# Patient Record
Sex: Male | Born: 1955
Health system: Southern US, Community
[De-identification: ages and names within clinical notes are randomized; demographics above are authoritative.]

## PROBLEM LIST (undated history)

## (undated) DIAGNOSIS — E119 Type 2 diabetes mellitus without complications: Secondary | ICD-10-CM

## (undated) DIAGNOSIS — C801 Malignant (primary) neoplasm, unspecified: Secondary | ICD-10-CM

## (undated) DIAGNOSIS — T7840XA Allergy, unspecified, initial encounter: Secondary | ICD-10-CM

## (undated) DIAGNOSIS — J129 Viral pneumonia, unspecified: Secondary | ICD-10-CM

## (undated) DIAGNOSIS — I10 Essential (primary) hypertension: Secondary | ICD-10-CM

## (undated) DIAGNOSIS — Z9889 Other specified postprocedural states: Secondary | ICD-10-CM

## (undated) DIAGNOSIS — G4730 Sleep apnea, unspecified: Secondary | ICD-10-CM

## (undated) HISTORY — DX: Essential (primary) hypertension: I10

## (undated) HISTORY — DX: Allergy, unspecified, initial encounter: T78.40XA

## (undated) HISTORY — DX: Type 2 diabetes mellitus without complications: E11.9

## (undated) HISTORY — DX: Other specified postprocedural states: Z98.890

## (undated) HISTORY — DX: Sleep apnea, unspecified: G47.30

## (undated) HISTORY — PX: WISDOM TOOTH EXTRACTION: SHX21

## (undated) HISTORY — DX: Malignant (primary) neoplasm, unspecified: C80.1

## (undated) HISTORY — DX: Viral pneumonia, unspecified: J12.9

## (undated) HISTORY — PX: COLONOSCOPY: SHX174

## (undated) HISTORY — PX: POLYPECTOMY: SHX149

---
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
## 1987-06-22 HISTORY — PX: OTHER SURGICAL HISTORY: SHX169

## 1996-06-21 DIAGNOSIS — Z9889 Other specified postprocedural states: Secondary | ICD-10-CM

## 1996-06-21 HISTORY — DX: Other specified postprocedural states: Z98.890

## 1996-06-21 HISTORY — PX: LYMPH NODE DISSECTION: SHX5087

## 1999-04-28 ENCOUNTER — Encounter: Payer: Self-pay | Admitting: Internal Medicine

## 2013-09-03 LAB — BASIC METABOLIC PANEL: Creatinine: 1 mg/dL (ref <=1.3)

## 2013-11-20 LAB — HEPATIC FUNCTION PANEL
ALT: 18 U/L (ref 10–40)
AST: 18 U/L (ref 14–40)
Bilirubin, Total: 1.2 mg/dL

## 2014-02-21 LAB — HM COLONOSCOPY

## 2014-06-04 LAB — LIPID PANEL: CHOLESTEROL: 122 mg/dL (ref 0–200)

## 2014-06-05 LAB — BASIC METABOLIC PANEL
BUN: 12 mg/dL (ref 4–21)
Glucose: 117 mg/dL
Potassium: 4 mmol/L (ref 3.4–5.3)
Sodium: 143 mmol/L (ref 137–147)

## 2014-06-05 LAB — LIPID PANEL
LDL CALC: 51 mg/dL
LDL/HDL RATIO: 51
Triglycerides: 101 mg/dL (ref 40–160)

## 2014-06-05 LAB — HEMOGLOBIN A1C: Hgb A1c MFr Bld: 6.7 % — AB (ref 4.0–6.0)

## 2014-09-02 ENCOUNTER — Ambulatory Visit (INDEPENDENT_AMBULATORY_CARE_PROVIDER_SITE_OTHER): Payer: 59 | Admitting: Internal Medicine

## 2014-09-02 ENCOUNTER — Encounter: Payer: Self-pay | Admitting: *Deleted

## 2014-09-02 ENCOUNTER — Encounter: Payer: Self-pay | Admitting: Internal Medicine

## 2014-09-02 VITALS — BP 142/90 | HR 96 | Temp 98.1°F | Resp 20 | Ht 66.93 in | Wt 239.0 lb

## 2014-09-02 DIAGNOSIS — I10 Essential (primary) hypertension: Secondary | ICD-10-CM | POA: Insufficient documentation

## 2014-09-02 DIAGNOSIS — D179 Benign lipomatous neoplasm, unspecified: Secondary | ICD-10-CM | POA: Insufficient documentation

## 2014-09-02 DIAGNOSIS — E119 Type 2 diabetes mellitus without complications: Secondary | ICD-10-CM | POA: Diagnosis not present

## 2014-09-02 DIAGNOSIS — J309 Allergic rhinitis, unspecified: Secondary | ICD-10-CM | POA: Diagnosis not present

## 2014-09-02 DIAGNOSIS — Z23 Encounter for immunization: Secondary | ICD-10-CM | POA: Diagnosis not present

## 2014-09-02 DIAGNOSIS — E785 Hyperlipidemia, unspecified: Secondary | ICD-10-CM | POA: Insufficient documentation

## 2014-09-02 DIAGNOSIS — K648 Other hemorrhoids: Secondary | ICD-10-CM

## 2014-09-02 MED ORDER — BENAZEPRIL-HYDROCHLOROTHIAZIDE 10-12.5 MG PO TABS: 1.0000 | ORAL_TABLET | Freq: Every day | ORAL | Status: DC

## 2014-09-02 MED ORDER — TRADJENTA 5 MG PO TABS: 5.0000 mg | ORAL_TABLET | Freq: Every day | ORAL | Status: DC

## 2014-09-02 MED ORDER — METFORMIN HCL 1000 MG PO TABS: 1000.0000 mg | ORAL_TABLET | Freq: Two times a day (BID) | ORAL | Status: DC

## 2014-09-02 MED ORDER — ATORVASTATIN CALCIUM 40 MG PO TABS: 40.0000 mg | ORAL_TABLET | Freq: Every day | ORAL | Status: DC

## 2014-09-02 NOTE — Progress Notes (Signed)
Patient ID: [name], male   DOB: 19-Nov-1955, 59 y.o.   MRN: [id number]   Location:  [facility]  Code Status: full code; will complete advance directive Advanced Directive information Does patient have an advance directive?: No, Would patient like information on creating an advanced directive?: Yes - Educational materials given  No Known Allergies  Chief Complaint  Patient presents with  . Establish Care    HPI: Patient is a 59 y.o. male seen in the office today to establish with the practice.  Has a friend who is a patient.  Had a PCP for 11 years at [facility].  Transitioned to next physician--no changes made.  Getting a lot of labs and is concerned he's on too many meds and wondering how often he truly needs labs.    Last physical was in June 2015.    Beneath stomach but above private area, has lump--forgets what it was called--was not too concerning.  Had surgeon look at it.  Chose not to manage.  Fatty tissue.  It has enlarged some.  When to worry?  DMII:  Diagnosed in 10/2001.  More and more meds added.  No meds, then metformin, then [place] added--now tradjenta due to formulary.  Also on pioglitazone, then strength increased.  Doesn't really know why changes were made.    Has had htn since 1999.  Taking bp meds since 1999.  Began with quinapril.  Long time on his current meds.    Had rectal bleeding last April.  Had cscope in Sept 2015--internal hemorrhoids identified.  Two benign polyps removed.  Occasional blood with wiping, but not overt bleeding.  Had cream for the hemorrhoids, but then told not to use it regularly so stopped.  Hasn't been having itching, pain. Need repeat cscope 2020.  Had lump in left lower neck 1998.  Had seen ENT in [place]--was removed and biopsied as benign.  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  Visits around to make sure providers are  providing state standards.   May drink one drink per week with dinner.  Has a daughter in grad school for speech pathology--now called communication and disorders.  Walks several times per week.  Tries to get out and walk around neighborhood.  Avoids sweets.  Does like salt, but tries to watch it.  Review of Systems:  Review of Systems  Constitutional: Negative for weight loss and malaise/fatigue.  HENT: Positive for congestion.        Sinus problems--uses fluticasone spray, zyrtec, mucinex; occasional infection needing abx about 1x per year  Eyes:       Corrective lenses--contacts, has glasses also;  Had diabetic eye check, last in October--[facility], [place]  Respiratory: Negative for shortness of breath.   Cardiovascular: Positive for leg swelling. Negative for chest pain and palpitations.       Hypertension; occasional swelling around ankles--will be end of day sometimes  Gastrointestinal: Negative for blood in stool and melena.       Diverticulosis, hemorrhoids; blood on toilet paper  Genitourinary: Negative for dysuria, urgency and frequency.       No difficulty starting stream  Musculoskeletal: Positive for back pain. Negative for falls.       Occasionally end of day--does not need medication for most part--rare advil  Neurological: Negative for dizziness.  Endo/Heme/Allergies:       Diabetes; gets his foot exams, but no problems with numbness, tingling  Psychiatric/Behavioral: Negative for depression and memory loss. The patient is not nervous/anxious and does not have insomnia.      Past Medical History  Diagnosis Date  . Hypertension   . Diabetes mellitus without complication     Type 2  . H/O lymph node excision [place] , [place]    No past surgical history on file.  Social History:   reports that he has never smoked. He does not have any smokeless tobacco history on file. He reports that he drinks alcohol. He reports that he does not use  illicit drugs.  Family History  Problem Relation Age of Onset  . Hypertension Mother   . Diabetes Mother 58    Diabetes type 2  . Heart disease Father 14    Heart Attack    Medications: Patient's Medications  New Prescriptions   No medications on file  Previous Medications   ASPIRIN 81 MG TABLET    Take 81 mg by mouth daily.   ATORVASTATIN (LIPITOR) 40 MG TABLET    Take 40 mg by mouth daily.   BENAZEPRIL-HYDROCHLORTHIAZIDE (LOTENSIN HCT) 10-12.5 MG PER TABLET    Take 1 tablet by mouth daily.   CETIRIZINE (ZYRTEC) 10 MG TABLET    Take 10 mg by mouth daily.   DEXTROMETHORPHAN-GUAIFENESIN (MUCINEX DM) 30-600 MG TB12    Take 1 tablet by mouth daily.   FLUTICASONE (FLONASE) 50 MCG/ACT NASAL SPRAY    Place 2 sprays into both nostrils daily.   METFORMIN (GLUCOPHAGE) 1000 MG TABLET    Take 1,000 mg by mouth 2 (two) times daily.   PIOGLITAZONE (ACTOS) 45 MG TABLET    Take 45 mg by mouth daily.   PROCTOSOL HC 2.5 % RECTAL CREAM    as needed.   PSYLLIUM (REGULOID) 0.52 G CAPSULE    Take 0.52 g by mouth daily.   TRADJENTA 5 MG TABS TABLET    Take 5 mg by mouth daily.  Modified Medications   No medications on file  Discontinued Medications   No medications on file     Physical Exam: Filed Vitals:   09/02/14 0905  BP: 142/90  Pulse: 96  Temp: 98.1 F (36.7 C)  TempSrc: Oral  Resp: 20  Height: 5' 6.93" (1.7 m)  Weight: 239 lb (108.41 kg)  SpO2: 95%  Physical Exam  Constitutional: He is oriented to person, place, and time. He appears well-developed and well-nourished. No distress.  Overweight white male, nad  Cardiovascular: Normal rate, regular rhythm, normal heart sounds and intact distal pulses.   ? Click vs. Extra heart sound  Pulmonary/Chest: Effort normal and breath sounds normal. No respiratory distress.  Musculoskeletal: Normal range of motion.  See diabetic foot exam  Neurological: He is alert and oriented to person, place, and time.  Skin: Skin is warm and dry.    Approximately 1.5 in wide soft mass, 1 in high in suprapubic area, nontender, mobile (lipoma)    Labs reviewed: Reviewed records from [facility]--to be abstracted by RMA  Past Procedures: Reviewed cscope report 2015 with benign polyps, internal hemorrhoids, diverticulosis--repeat needed 2020  Assessment/Plan 1. Diabetes mellitus type II, controlled - will assess current hba1c and bmp -cont metformin and tradjenta--I would prefer not to cont actos considering his edema and his fh/o cad in his dad -will determine alternative after labs return -discussed walking 30 mins routinely per day and adhering to the diet with proper portion sizes and 2000 calories per day max - metFORMIN (GLUCOPHAGE) 1000  MG tablet; Take 1 tablet (1,000 mg total) by mouth 2 (two) times daily.  Dispense: 180 tablet; Refill: 1 - TRADJENTA 5 MG TABS tablet; Take 1 tablet (5 mg total) by mouth daily.  Dispense: 90 tablet; Refill: 1 - Hemoglobin R6V - Basic metabolic panel  2. Hyperlipidemia -lipids were at goal in Dec 2015 and have been at goal for years he says with lipitor - atorvastatin (LIPITOR) 40 MG tablet; Take 1 tablet (40 mg total) by mouth daily.  Dispense: 90 tablet; Refill: 1  3. Essential hypertension, benign -bp was above goal today with his diabetes, but also first visit here and seemed anxious so will reassess next time - benazepril-hydrochlorthiazide (LOTENSIN HCT) 10-12.5 MG per tablet; Take 1 tablet by mouth daily.  Dispense: 90 tablet; Refill: 1  4. Lipoma -of lower abdomen, suprapubic area -previously identified and no surgery recommended -may have grown slightly  5. Allergic sinusitis -cont flonase, zyrtec, and mucinex as he has flare ups if he does not take these  6. Internal hemorrhoids -with h/o some hemorrhoidal bleeding; benign polyps on cscope--needs f/u 2020  7. Need for vaccination with 13-polyvalent pneumococcal conjugate vaccine -prevnar given today  Labs/tests ordered:    Orders Placed This Encounter  Procedures  . Hemoglobin A1c  . Basic metabolic panel    Next appt:  3 mos to discuss possible OSA, f/u on diabetes  Herlinda Heady L. Akshaya Toepfer, D.O. [facility] [address], [place] [address] Cell Phone (Mon-Fri 8am-5pm):  [phone] On Call:  [phone] & follow prompts after 5pm & weekends Office Phone:  [phone] Office Fax:  [phone]

## 2014-09-03 LAB — BASIC METABOLIC PANEL
BUN/Creatinine Ratio: 12 (ref 9–20)
BUN: 11 mg/dL (ref 6–24)
CO2: 26 mmol/L (ref 18–29)
Calcium: 8.7 mg/dL (ref 8.7–10.2)
Chloride: 102 mmol/L (ref 97–108)
Creatinine, Ser: 0.91 mg/dL (ref 0.76–1.27)
GFR calc Af Amer: 107 mL/min/{1.73_m2} (ref >59)
GFR calc non Af Amer: 93 mL/min/{1.73_m2} (ref >59)
Glucose: 123 mg/dL — ABNORMAL HIGH (ref 65–99)
Potassium: 3.8 mmol/L (ref 3.5–5.2)
Sodium: 142 mmol/L (ref 134–144)

## 2014-09-03 LAB — HEMOGLOBIN A1C
Est. average glucose Bld gHb Est-mCnc: 148 mg/dL
Hgb A1c MFr Bld: 6.8 % — ABNORMAL HIGH (ref 4.8–5.6)

## 2014-09-24 ENCOUNTER — Other Ambulatory Visit: Payer: Self-pay | Admitting: *Deleted

## 2014-09-24 MED ORDER — PIOGLITAZONE HCL 45 MG PO TABS: ORAL_TABLET | ORAL | Status: DC

## 2014-09-24 NOTE — Telephone Encounter (Signed)
[facility] 

## 2014-12-06 ENCOUNTER — Ambulatory Visit (INDEPENDENT_AMBULATORY_CARE_PROVIDER_SITE_OTHER): Payer: 59 | Admitting: Internal Medicine

## 2014-12-06 ENCOUNTER — Encounter: Payer: Self-pay | Admitting: Internal Medicine

## 2014-12-06 VITALS — BP 130/78 | HR 94 | Temp 98.1°F | Resp 20 | Ht 67.0 in | Wt 239.6 lb

## 2014-12-06 DIAGNOSIS — I10 Essential (primary) hypertension: Secondary | ICD-10-CM | POA: Diagnosis not present

## 2014-12-06 DIAGNOSIS — T884XXA Failed or difficult intubation, initial encounter: Secondary | ICD-10-CM | POA: Diagnosis not present

## 2014-12-06 DIAGNOSIS — E785 Hyperlipidemia, unspecified: Secondary | ICD-10-CM | POA: Diagnosis not present

## 2014-12-06 DIAGNOSIS — L570 Actinic keratosis: Secondary | ICD-10-CM

## 2014-12-06 DIAGNOSIS — X32XXXA Exposure to sunlight, initial encounter: Secondary | ICD-10-CM

## 2014-12-06 DIAGNOSIS — E119 Type 2 diabetes mellitus without complications: Secondary | ICD-10-CM

## 2014-12-06 DIAGNOSIS — R609 Edema, unspecified: Secondary | ICD-10-CM

## 2014-12-06 NOTE — Progress Notes (Signed)
Patient ID: [name], male   DOB: 13-Feb-1956, 59 y.o.   MRN: [id number]   Location:  [facility]  Goals of Care: Advanced Directive information Does patient have an advance directive?: No, Would patient like information on creating an advanced directive?: Yes - Educational materials given (last visit, need to discuss again and check if he's done this so he can bring a copy)   Chief Complaint  Patient presents with  . Medical Management of Chronic Issues    HPI: Patient is a 59 y.o. white male seen in the office today for 3 mo f/u for med mgt of chronic diseases.    Doing ok.   Last hba1c was 6.8.  Still struggling with time for exercise.  His wife keeps reminding him about it.  Is doing well with his dietary choices.  July 1st his insurance flips.  Wonders about waiting to see what's covered.  Will know Monday if insurance company will be the same.  Says he will send me a mychart message.    Asks about pneumovax--keep getting mychart reminders.    Has a place on his right arm.  Has been there for several months.  Comes to a head, then disappears but now staying there with a scab.  He is very fair-skinned.  He's had bad sunburn in the [employer].    When he had his cscope in 9/15, the anesthesiologist was concerned.  His airway was difficult to intubate.  He does snore also.  Does not fall asleep during the day when working or reading or watching tv.      Review of Systems:  Review of Systems  Constitutional: Negative for fever, chills, weight loss and malaise/fatigue.       Obese  HENT: Negative for congestion and hearing loss.   Respiratory: Negative for shortness of breath.        Snores  Cardiovascular: Negative for chest pain and leg swelling.  Gastrointestinal: Negative for abdominal pain, constipation, blood in stool and melena.  Genitourinary: Negative for dysuria, urgency and frequency.  Musculoskeletal: Negative for falls.    Skin:       Place on right forearm concerning him  Neurological: Negative for dizziness, weakness and headaches.  Endo/Heme/Allergies:       Diabetes  Psychiatric/Behavioral: Negative for depression and memory loss.    Past Medical History  Diagnosis Date  . Hypertension   . Diabetes mellitus without complication     Type 2  . H/O lymph node excision [place] , [place]  . Viral pneumonia 1965    No past surgical history on file.  No Known Allergies Medications: Patient's Medications  New Prescriptions   No medications on file  Previous Medications   ASPIRIN 81 MG TABLET    Take 81 mg by mouth daily.   ATORVASTATIN (LIPITOR) 40 MG TABLET    Take 1 tablet (40 mg total) by mouth daily.   BENAZEPRIL-HYDROCHLORTHIAZIDE (LOTENSIN HCT) 10-12.5 MG PER TABLET    Take 1 tablet by mouth daily.   CETIRIZINE (ZYRTEC) 10 MG TABLET    Take 10 mg by mouth daily.   DEXTROMETHORPHAN-GUAIFENESIN (MUCINEX DM) 30-600 MG TB12    Take 1 tablet by mouth daily.   FLUTICASONE (FLONASE) 50 MCG/ACT NASAL SPRAY    Place 2 sprays into both nostrils daily.   METFORMIN (GLUCOPHAGE) 1000 MG TABLET    Take 1 tablet (1,000 mg total) by mouth 2 (two) times daily.  PIOGLITAZONE (ACTOS) 45 MG TABLET    Take one tablet by mouth once daily to control blood sugar   PROCTOSOL HC 2.5 % RECTAL CREAM    as needed.   PSYLLIUM (REGULOID) 0.52 G CAPSULE    Take 0.52 g by mouth daily.   TRADJENTA 5 MG TABS TABLET    Take 1 tablet (5 mg total) by mouth daily.  Modified Medications   No medications on file  Discontinued Medications   No medications on file    Physical Exam: Filed Vitals:   12/06/14 0809  BP: 130/78  Pulse: 94  Temp: 98.1 F (36.7 C)  TempSrc: Oral  Resp: 20  Height: 5\' 7"  (1.702 m)  Weight: 239 lb 9.6 oz (108.682 kg)  SpO2: 92%   Physical Exam  Constitutional: He is oriented to person, place, and time. He appears well-developed and well-nourished. No distress.  Cardiovascular: Normal  rate, regular rhythm and normal heart sounds.   Mild edema at ankles at sockline  Pulmonary/Chest: Effort normal and breath sounds normal.  Abdominal: Soft. Bowel sounds are normal.  Musculoskeletal: Normal range of motion.  Neurological: He is alert and oriented to person, place, and time.  Delayed word finding  Skin: Skin is warm.  Right forearm with raised, papule with eschar on top  Psychiatric: He has a normal mood and affect.    Labs reviewed: Basic Metabolic Panel:  Recent Labs  06/05/14 09/02/14 1036  NA 143 142  K 4.0 3.8  CL  --  102  CO2  --  26  GLUCOSE  --  123*  BUN 12 11  CREATININE  --  0.91  CALCIUM  --  8.7   Liver Function Tests: No results for input(s): AST, ALT, ALKPHOS, BILITOT, PROT, ALBUMIN in the last 8760 hours. No results for input(s): LIPASE, AMYLASE in the last 8760 hours. No results for input(s): AMMONIA in the last 8760 hours. CBC: No results for input(s): WBC, NEUTROABS, HGB, HCT, MCV, PLT in the last 8760 hours. Lipid Panel:  Recent Labs  06/04/14 06/05/14  CHOL 122  --   LDLCALC  --  51  TRIG  --  101   Lab Results  Component Value Date   HGBA1C 6.8* 09/02/2014   Assessment/Plan 1. Diabetes mellitus type II, controlled - cont dietary changes, actos, metformin and tradjenta--await change in insurance to see what is covered--he will bring formulary in next time  -encouraged regular exercise like walking  For weight loss - Hemoglobin H2C - Basic metabolic panel  2. Hyperlipidemia Cont zocor, dietary changes and increase exercise program - Lipid panel  3. Essential hypertension, benign -cont bp meds  - Basic metabolic panel  4. Actinic keratosis due to exposure to sunlight - due to persistence, this is concerning;  Has been sunburned here years ago and is very fair-skinned so question if this is a squamous cell ca - Ambulatory referral to Dermatology  5. Difficult airway for intubation, initial encounter -per  anesthesiology when pt had cscope, also snores -denies daytime sleepiness, but is obese with large neck, hypertensive, diabetic -will plan to do sleep study once he knows what his insurance coverage will be in July  6. Edema -suspect this is due to his actos use--would consider trulicity or other weekly injection for his diabetes in its place if it will be covered so we can also help with weight loss (may also have him see Tivis Ringer) - Basic metabolic panel  Labs/tests ordered: Orders Placed This Encounter  Procedures  . Hemoglobin A1c  . Basic metabolic panel    Order Specific Question:  Has the patient fasted?    Answer:  Yes  . Lipid panel    Order Specific Question:  Has the patient fasted?    Answer:  Yes  . Ambulatory referral to Dermatology    Referral Priority:  Routine    Referral Type:  Consultation    Referral Reason:  Specialty Services Required    Requested Specialty:  Dermatology    Number of Visits Requested:  1    Next appt:  3 mos   Hollister Wessler L. Mancil Pfenning, D.O. [facility] [address], [place] [address] Cell Phone (Mon-Fri 8am-5pm):  [phone] On Call:  [phone] & follow prompts after 5pm & weekends Office Phone:  [phone] Office Fax:  [phone]

## 2014-12-07 LAB — BASIC METABOLIC PANEL
BUN/Creatinine Ratio: 11 (ref 9–20)
BUN: 11 mg/dL (ref 6–24)
CO2: 21 mmol/L (ref 18–29)
Calcium: 8.9 mg/dL (ref 8.7–10.2)
Chloride: 104 mmol/L (ref 97–108)
Creatinine, Ser: 0.99 mg/dL (ref 0.76–1.27)
GFR calc Af Amer: 97 mL/min/{1.73_m2} (ref >59)
GFR calc non Af Amer: 84 mL/min/{1.73_m2} (ref >59)
Glucose: 112 mg/dL — ABNORMAL HIGH (ref 65–99)
Potassium: 4.3 mmol/L (ref 3.5–5.2)
Sodium: 140 mmol/L (ref 134–144)

## 2014-12-07 LAB — LIPID PANEL
Chol/HDL Ratio: 2.2 ratio units (ref 0.0–5.0)
Cholesterol, Total: 99 mg/dL — ABNORMAL LOW (ref 100–199)
HDL: 45 mg/dL (ref >39)
LDL Calculated: 39 mg/dL (ref 0–99)
Triglycerides: 76 mg/dL (ref 0–149)
VLDL Cholesterol Cal: 15 mg/dL (ref 5–40)

## 2014-12-07 LAB — HEMOGLOBIN A1C
Est. average glucose Bld gHb Est-mCnc: 143 mg/dL
Hgb A1c MFr Bld: 6.6 % — ABNORMAL HIGH (ref 4.8–5.6)

## 2014-12-31 DIAGNOSIS — C4492 Squamous cell carcinoma of skin, unspecified: Secondary | ICD-10-CM

## 2014-12-31 HISTORY — DX: Squamous cell carcinoma of skin, unspecified: C44.92

## 2015-01-02 ENCOUNTER — Encounter: Payer: Self-pay | Admitting: Internal Medicine

## 2015-01-03 NOTE — Telephone Encounter (Signed)
Please advise 

## 2015-02-27 ENCOUNTER — Ambulatory Visit (INDEPENDENT_AMBULATORY_CARE_PROVIDER_SITE_OTHER): Payer: PRIVATE HEALTH INSURANCE | Admitting: Internal Medicine

## 2015-02-27 ENCOUNTER — Encounter: Payer: Self-pay | Admitting: Internal Medicine

## 2015-02-27 ENCOUNTER — Ambulatory Visit
Admission: RE | Admit: 2015-02-27 | Discharge: 2015-02-27 | Disposition: A | Discharge status: Home / Self Care | Payer: PRIVATE HEALTH INSURANCE | Source: Ambulatory Visit | Attending: Internal Medicine | Admitting: Internal Medicine

## 2015-02-27 VITALS — BP 130/90 | HR 91 | Temp 98.2°F | Resp 19 | Ht 67.0 in | Wt 243.2 lb

## 2015-02-27 DIAGNOSIS — E669 Obesity, unspecified: Secondary | ICD-10-CM

## 2015-02-27 DIAGNOSIS — E785 Hyperlipidemia, unspecified: Secondary | ICD-10-CM

## 2015-02-27 DIAGNOSIS — E119 Type 2 diabetes mellitus without complications: Secondary | ICD-10-CM

## 2015-02-27 DIAGNOSIS — Z23 Encounter for immunization: Secondary | ICD-10-CM | POA: Diagnosis not present

## 2015-02-27 DIAGNOSIS — M25461 Effusion, right knee: Secondary | ICD-10-CM | POA: Diagnosis not present

## 2015-02-27 DIAGNOSIS — R609 Edema, unspecified: Secondary | ICD-10-CM | POA: Diagnosis not present

## 2015-02-27 DIAGNOSIS — M25561 Pain in right knee: Secondary | ICD-10-CM | POA: Diagnosis not present

## 2015-02-27 DIAGNOSIS — J309 Allergic rhinitis, unspecified: Secondary | ICD-10-CM | POA: Diagnosis not present

## 2015-02-27 MED ORDER — ATORVASTATIN CALCIUM 40 MG PO TABS: 40.0000 mg | ORAL_TABLET | Freq: Every day | ORAL | Status: DC

## 2015-02-27 MED ORDER — METFORMIN HCL 1000 MG PO TABS: 1000.0000 mg | ORAL_TABLET | Freq: Two times a day (BID) | ORAL | Status: DC

## 2015-02-27 MED ORDER — FLUTICASONE PROPIONATE 50 MCG/ACT NA SUSP: 2.0000 | Freq: Every day | NASAL | Status: DC

## 2015-02-27 NOTE — Progress Notes (Signed)
Patient ID: [name], male   DOB: 05/13/1956, 59 y.o.   MRN: [id number]   Location:  [facility]  Goals of Care: Advanced Directive information Does patient have an advance directive?: Yes   Chief Complaint  Patient presents with  . Acute Visit    Swollen feet noticed about a week ago has pain in right knee   . Immunizations    would like flu shot today    HPI: Patient is a 59 y.o. white male seen in the office today for an acute visit due to swelling of his feet and pain in his right knee.    Has had about a week of swelling in his feet.  Had already had a little swelling in the ankles, but worse now.  Right is worse than left.  No dyspnea.    Also getting pain when he bends his right knee and gets stiff behind the knee and pain in the medial aspect of his knee.  Hurts more when he walks on it.  Feels like he is limping and not bending the knee properly when walking.  Below knee medially--occurs sometimes when presses firmly down--especially when going upstairs--has been putting his left leg first.  Thinks perhaps he twisted it when he picked up something heavy--didn't turn whole body with the leg.  That happened about 2 wks ago.  Stiffens after sitting behind the knee.  Has been trying to get up at work and walk around the hall.  Has taken tylenol and occasionally advil b/c it seems to help more with the tightness in the back of the knee.  Relieves it somewhat.  Did more walking than usual on Sunday and really hurt.  Each day has gradually improved, but not going away.    Review of Systems:  Review of Systems  Constitutional: Negative for fever and chills.  HENT: Positive for congestion.   Eyes: Negative for blurred vision.  Respiratory: Negative for shortness of breath.   Cardiovascular: Positive for leg swelling. Negative for chest pain.  Genitourinary: Negative for dysuria.  Musculoskeletal: Positive for myalgias and joint pain. Negative  for falls.  Skin:       Scars from cryopen and scraping on right forearm    Past Medical History  Diagnosis Date  . Hypertension   . Diabetes mellitus without complication     Type 2  . H/O lymph node excision [place] , [place]  . Viral pneumonia 1965    History reviewed. No pertinent past surgical history.  No Known Allergies Medications: Patient's Medications  New Prescriptions   No medications on file  Previous Medications   ASPIRIN 81 MG TABLET    Take 81 mg by mouth daily.   ATORVASTATIN (LIPITOR) 40 MG TABLET    Take 1 tablet (40 mg total) by mouth daily.   BENAZEPRIL-HYDROCHLORTHIAZIDE (LOTENSIN HCT) 10-12.5 MG PER TABLET    Take 1 tablet by mouth daily.   CETIRIZINE (ZYRTEC) 10 MG TABLET    Take 10 mg by mouth daily.   DEXTROMETHORPHAN-GUAIFENESIN (MUCINEX DM) 30-600 MG TB12    Take 1 tablet by mouth daily.   FLUTICASONE (FLONASE) 50 MCG/ACT NASAL SPRAY    Place 2 sprays into both nostrils daily.   METFORMIN (GLUCOPHAGE) 1000 MG TABLET    Take 1 tablet (1,000 mg total) by mouth 2 (two) times daily.   PIOGLITAZONE (ACTOS) 45 MG TABLET    Take one tablet by mouth once  daily to control blood sugar   PROCTOSOL HC 2.5 % RECTAL CREAM    as needed.   PSYLLIUM (REGULOID) 0.52 G CAPSULE    Take 0.52 g by mouth daily.   TRADJENTA 5 MG TABS TABLET    Take 1 tablet (5 mg total) by mouth daily.  Modified Medications   No medications on file  Discontinued Medications   No medications on file    Physical Exam: Filed Vitals:   02/27/15 0921  BP: 130/90  Pulse: 91  Temp: 98.2 F (36.8 C)  TempSrc: Oral  Resp: 19  Height: 5\' 7"  (1.702 m)  Weight: 243 lb 3.2 oz (110.315 kg)  SpO2: 95%   Physical Exam  Constitutional: He is oriented to person, place, and time. He appears well-developed and well-nourished. No distress.  Obese white male  Cardiovascular: Normal rate, regular rhythm, normal heart sounds and intact distal pulses.   Mild nonpitting edema of left ankle, much  more in right ankle and lower leg  Pulmonary/Chest: Effort normal and breath sounds normal.  Musculoskeletal: He exhibits edema and tenderness.  Right knee with effusion medially and tenderness over medial aspect/meniscal region, walking with limp  Neurological: He is alert and oriented to person, place, and time.  Skin:  Scars of right arm from cryopen  Psychiatric: He has a normal mood and affect.    Labs reviewed: Basic Metabolic Panel:  Recent Labs  06/05/14 09/02/14 1036 12/06/14 0851  NA 143 142 140  K 4.0 3.8 4.3  CL  --  102 104  CO2  --  26 21  GLUCOSE  --  123* 112*  BUN 12 11 11   CREATININE  --  0.91 0.99  CALCIUM  --  8.7 8.9   Liver Function Tests: No results for input(s): AST, ALT, ALKPHOS, BILITOT, PROT, ALBUMIN in the last 8760 hours. No results for input(s): LIPASE, AMYLASE in the last 8760 hours. No results for input(s): AMMONIA in the last 8760 hours. CBC: No results for input(s): WBC, NEUTROABS, HGB, HCT, MCV, PLT in the last 8760 hours. Lipid Panel:  Recent Labs  06/04/14 06/05/14 12/06/14 0851  CHOL 122  --  99*  HDL  --   --  45  LDLCALC  --  51 39  TRIG  --  101 76  CHOLHDL  --   --  2.2   Lab Results  Component Value Date   HGBA1C 6.6* 12/06/2014   Pt explained that he saw [facility] and had a biopsy and scrapings on his right arm that did show some squamous cells.  I recall seeing this report, but cannot locate the scanned copy.  Assessment/Plan 1. Knee swelling, right - new, after turning and picking up something heavy -obtain basic films and go from there - DG Knee Complete 4 Views Right; Future -RICE instructions provided -may also use topicals that are cold   2. Diabetes mellitus type II, controlled - cont metformin, tradjenta, benazepril and lipitor -stop actos due to edema and plan to start trulicity after education with [facility]  - metFORMIN (GLUCOPHAGE) 1000 MG tablet; Take 1 tablet (1,000 mg total) by mouth 2 (two)  times daily.  Dispense: 60 tablet; Refill: 3  3. Edema -some probably due to actos--he's had mild edema -will d/c actos -also much more is in right ankle due to knee injury at present--await xrays  4. Right medial knee pain -RICE -suspect that he has a medial meniscal tear--may be partial as he does not seem  to have any joint laxity - DG Knee Complete 4 Views Right; Future--he's going now  5. Obesity -unable to walk much with acute right knee injury - hoping trulicity will help weight loss  6. Hyperlipidemia - cont current statin therapy and f/u labs day of appt - atorvastatin (LIPITOR) 40 MG tablet; Take 1 tablet (40 mg total) by mouth daily.  Dispense: 30 tablet; Refill: 3 - Hemoglobin A1c; Future - Lipid panel; Future - Basic metabolic panel; Future  7. Allergic rhinitis, unspecified allergic rhinitis type - needs refills of flonase, this problem is unchanged for him - fluticasone (FLONASE) 50 MCG/ACT nasal spray; Place 2 sprays into both nostrils daily.  Dispense: 16 g; Refill: 3  8. Need for prophylactic vaccination and inoculation against influenza -flu shot given  Labs/tests ordered:   Orders Placed This Encounter  Procedures  . DG Knee Complete 4 Views Right    Standing Status: Future     Number of Occurrences:      Standing Expiration Date: 04/28/2016    Order Specific Question:  Reason for Exam (SYMPTOM  OR DIAGNOSIS REQUIRED)    Answer:  right knee medial pain, worse on walking, present x 1-2 wks    Order Specific Question:  Preferred imaging location?    Answer:  [facility]  . Hemoglobin A1c    Standing Status: Future     Number of Occurrences:      Standing Expiration Date: 05/29/2015  . Lipid panel    Standing Status: Future     Number of Occurrences:      Standing Expiration Date: 05/29/2015    Order Specific Question:  Has the patient fasted?    Answer:  Yes  . Basic metabolic panel    Standing Status: Future     Number of Occurrences:        Standing Expiration Date: 05/29/2015    Order Specific Question:  Has the patient fasted?    Answer:  Yes    Next appt:  Keep on 9/19 with fasting labs that day, also make one with [facility] to consider trulicity which is preferred on his formulary to treat his diabetes and obesity  Lyndall Bellot L. Taylia Berber, D.O. [facility] [address], [place] [address] Cell Phone (Mon-Fri 8am-5pm):  [phone] On Call:  [phone] & follow prompts after 5pm & weekends Office Phone:  [phone] Office Fax:  [phone]

## 2015-02-27 NOTE — Patient Instructions (Addendum)
I suspect this is what's going on with your knee.  RICE:  Rest, Ice, Compress, Elevate   Knee, Cartilage (Meniscus) Injury It is suspected that you have a torn cartilage (meniscus) in your knee. The menisci are made of tough cartilage and fit between the surfaces of the thigh and leg bones. The menisci are C-shaped and have a wedged profile. The wedged profile helps the stability of the joint by keeping the rounded femur surface from sliding off the flat tibial surface. The menisci are fed (nourished) by small blood vessels, but there is also a large area at the inner edge of the meniscus that does not have a good blood supply (avascular). This presents a problem when there is an injury to the meniscus because areas without good blood supply heal poorly. As a result when there is a torn cartilage in the knee, surgery is often required to fix it. This is usually done with a surgical procedure less invasive than open surgery (arthroscopy). Some times open surgery of the knee is required if there is other damage. PURPOSE OF THE MENISCUS The medial meniscus rests on the medial tibial plateau. The tibia is the large bone in your lower leg (the shin bone). The medial tibial plateau is the upper end of the bone making up the inner part of your knee. The lateral meniscus serves the same purpose and is located on the outside of the knee. The menisci help to distribute your body weight across the knee joint; they act as shock absorbers. Without the meniscus present, the weight of your body would be unevenly applied to the bones in your legs (the femur and tibia). The femur is the large bone in your thigh. This uneven weight distribution would cause increased wear and tear on the cartilage lining the joint surfaces, leading to early damage (arthritis) of these areas. The presence of the menisci cartilage is necessary for a healthy knee. PURPOSE OF THE KNEE CARTILAGE The knee joint is made up of three bones: the thigh  bone (femur), the shin bone (tibia), and the knee cap (patella). The surfaces of these bones at the knee joint are covered with cartilage called articular cartilage. This smooth, slippery surface allows the bones to slide against each other without causing bone damage. The meniscus sits between these cartilaginous surfaces of the bones. It distributes the weight evenly in the joints and helps with the stability of the joint (keeps the joint steady). HOME CARE INSTRUCTIONS  Use crutches and external braces as instructed.  Once home, an ice pack applied to your injured knee may help with discomfort and keep the swelling down. An ice pack can be used for the first couple of days or as instructed.  Only take over-the-counter or prescription medicines for pain, discomfort, or fever as directed by your caregiver.  Call if you do not have relief of pain with medications or if there is increasing in pain.  Call if your foot becomes cold or blue.  You may resume normal diet and activities as directed.  Make sure to keep your appointments with your follow-up caregiver. This injury may require further evaluation and treatment beyond the temporary treatment given today. Document Released: 08/28/2002 Document Revised: 10/22/2013 Document Reviewed: 12/20/2008 [facility] Patient Information 2015 [place], [place]. This information is not intended to replace advice given to you by your health care provider. Make sure you discuss any questions you have with your health care provider.

## 2015-02-28 ENCOUNTER — Other Ambulatory Visit: Payer: Self-pay | Admitting: Internal Medicine

## 2015-02-28 DIAGNOSIS — M25561 Pain in right knee: Secondary | ICD-10-CM

## 2015-03-03 ENCOUNTER — Ambulatory Visit (INDEPENDENT_AMBULATORY_CARE_PROVIDER_SITE_OTHER): Payer: PRIVATE HEALTH INSURANCE | Admitting: Pharmacotherapy

## 2015-03-03 ENCOUNTER — Encounter: Payer: Self-pay | Admitting: Pharmacotherapy

## 2015-03-03 VITALS — BP 128/84 | HR 91 | Temp 97.8°F | Resp 20 | Ht 67.0 in | Wt 242.0 lb

## 2015-03-03 DIAGNOSIS — E119 Type 2 diabetes mellitus without complications: Secondary | ICD-10-CM | POA: Diagnosis not present

## 2015-03-03 DIAGNOSIS — I10 Essential (primary) hypertension: Secondary | ICD-10-CM

## 2015-03-03 MED ORDER — DULAGLUTIDE 0.75 MG/0.5ML ~~LOC~~ SOAJ: 0.7500 mg | SUBCUTANEOUS | Status: DC

## 2015-03-03 NOTE — Progress Notes (Signed)
  Subjective:    [name] is a 59 y.o.white male who presents for follow-up of Type 2 diabetes mellitus.  Actos recently stopped due to LE edema. Dr. Mariea Clonts would like him to start GLP1 (Trulicity on formulary)  Currently on Metformin and Tradjenta. The peripheral edema has been much better since stopping Actos.  He reports BG 116-120mg /dl No hypoglycemia.  He tries to make healthy food choices.  Limits obvious sugar intake.  Cuts portion sizes. Exercise limited due to knee pain. Denies problems with feet. Wears corrective lenses.  Denies problems with vision. Nocturia once per night.   Review of Systems A comprehensive review of systems was negative except for: Eyes: positive for contacts/glasses Genitourinary: positive for nocturia Musculoskeletal: positive for arthralgias and stiff joints    Objective:    BP 128/84 mmHg  Pulse 91  Temp(Src) 97.8 F (36.6 C) (Oral)  Resp 20  Ht 5\' 7"  (1.702 m)  Wt 242 lb (109.77 kg)  BMI 37.89 kg/m2  SpO2 97%  General:  alert, cooperative and no distress  Oropharynx: normal findings: lips normal without lesions and gums healthy   Eyes:  negative findings: lids and lashes normal and conjunctivae and sclerae normal   Ears:  external ears normal        Lung: clear to auscultation bilaterally  Heart:  regular rate and rhythm     Extremities: edema bilateral lower extremities - improved  Skin: warm and dry, no hyperpigmentation, vitiligo, or suspicious lesions     Neuro: mental status, speech normal, alert and oriented x3 and gait and station normal   Lab Review GLUCOSE (mg/dL)  Date Value  12/06/2014 112*  09/02/2014 123*   CO2 (mmol/L)  Date Value  12/06/2014 21  09/02/2014 26   BUN (mg/dL)  Date Value  12/06/2014 11  09/02/2014 11  06/05/2014 12   CREATININE (mg/dL)  Date Value  09/03/2013 1.0   CREATININE, SER (mg/dL)  Date Value  12/06/2014 0.99  09/02/2014 0.91       Assessment:    Diabetes  Mellitus type II, under good control.   BP at goal <140/90   Plan:    1.  Rx changes: stop Tradjenta.  Start Trulicity 0.75mg  once a week. 2.  Counseled on risk/ benefit of Trulicity including black box warning and s/sx of pancreatitis. 3.  Continue metformin. 4.  Reviewed nutrition goals. 5.  Exercise goal is 30-45 minutes 5 x week. 6.  First dose of Trulicity given in office. 7.  BP at goal <140/90

## 2015-03-03 NOTE — Patient Instructions (Signed)
Stop Tradjenta  Start Trulicity 0.75mg  once a week.

## 2015-03-10 ENCOUNTER — Ambulatory Visit (INDEPENDENT_AMBULATORY_CARE_PROVIDER_SITE_OTHER): Payer: PRIVATE HEALTH INSURANCE | Admitting: Internal Medicine

## 2015-03-10 ENCOUNTER — Encounter: Payer: Self-pay | Admitting: Internal Medicine

## 2015-03-10 VITALS — BP 128/86 | HR 84 | Temp 97.6°F | Resp 20 | Ht 67.0 in | Wt 238.0 lb

## 2015-03-10 DIAGNOSIS — E119 Type 2 diabetes mellitus without complications: Secondary | ICD-10-CM

## 2015-03-10 DIAGNOSIS — E785 Hyperlipidemia, unspecified: Secondary | ICD-10-CM

## 2015-03-10 DIAGNOSIS — M25561 Pain in right knee: Secondary | ICD-10-CM

## 2015-03-10 DIAGNOSIS — I10 Essential (primary) hypertension: Secondary | ICD-10-CM | POA: Diagnosis not present

## 2015-03-10 DIAGNOSIS — E669 Obesity, unspecified: Secondary | ICD-10-CM | POA: Diagnosis not present

## 2015-03-10 DIAGNOSIS — M25461 Effusion, right knee: Secondary | ICD-10-CM

## 2015-03-10 DIAGNOSIS — R609 Edema, unspecified: Secondary | ICD-10-CM

## 2015-03-10 NOTE — Progress Notes (Signed)
Patient ID: [name], male   DOB: [birth year], 59 y.o.   MRN: [id number]   Location:  [facility]  Goals of Care: Advanced Directive information Does patient have an advance directive?: No, Would patient like information on creating an advanced directive?: Yes - Educational materials given   Chief Complaint  Patient presents with  . Medical Management of Chronic Issues    3 month follow-up for DM, Hypertension    HPI: Patient is a 59 y.o. white male seen in the office today for med mgt of chronic diseases.    Right knee:  A little better now.  MRI is Wednesday.    DMII:  Using trulicity--using on Sun mornings.  Said he maybe felt a little nauseous yesterday.  Feels fine now.  No feelings of lows.    Ankle swelling:  Left improved to normal.  Right ankle also a little better, but not baseline and having his knee problem.    Squamous cell ca of right forearm.    Has already had hep C screening.  He brought the paperwork from [facility] for me to add this his chart.  Due for pneumovax again next year due to DM and under 44 yo.  Just had prevnar in March so insurance will not cover pneumovax this soon, I suspect.    Sleep apnea concern:  Wants to wait until MRI done re: knee before he gets sleep study done.   Review of Systems:  Review of Systems  Constitutional: Negative for fever and chills.  HENT: Positive for congestion.   Eyes: Negative for blurred vision.       Glasses  Respiratory: Negative for shortness of breath.   Cardiovascular: Negative for chest pain.       Edema of both ankles improved, some remains in right vs left however  Gastrointestinal: Negative for abdominal pain, constipation, blood in stool and melena.  Genitourinary: Negative for dysuria, urgency and frequency.  Musculoskeletal: Positive for joint pain. Negative for falls.       Right knee painful and swollen, but improved from 2 wks ago  Skin: Negative for  itching and rash.  Neurological: Negative for dizziness.  Endo/Heme/Allergies: Does not bruise/bleed easily.  Psychiatric/Behavioral: Negative for depression and memory loss.    Past Medical History  Diagnosis Date  . Hypertension   . Diabetes mellitus without complication     Type 2  . H/O lymph node excision [place] , [place]  . Viral pneumonia 1965    History reviewed. No pertinent past surgical history.  No Known Allergies Medications: Patient's Medications  New Prescriptions   No medications on file  Previous Medications   ASPIRIN 81 MG TABLET    Take 81 mg by mouth daily.   ATORVASTATIN (LIPITOR) 40 MG TABLET    Take 1 tablet (40 mg total) by mouth daily.   BENAZEPRIL-HYDROCHLORTHIAZIDE (LOTENSIN HCT) 10-12.5 MG PER TABLET    Take 1 tablet by mouth daily.   CETIRIZINE (ZYRTEC) 10 MG TABLET    Take 10 mg by mouth daily.   DEXTROMETHORPHAN-GUAIFENESIN (MUCINEX DM) 30-600 MG TB12    Take 1 tablet by mouth daily.   DULAGLUTIDE (TRULICITY) 6.29 BM/8.4XL SOPN    Inject 0.75 mg into the skin once a week.   FLUTICASONE (FLONASE) 50 MCG/ACT NASAL SPRAY    Place 2 sprays into both nostrils daily.   METFORMIN (GLUCOPHAGE) 1000 MG TABLET    Take 1 tablet (1,000 mg  total) by mouth 2 (two) times daily.   PROCTOSOL HC 2.5 % RECTAL CREAM    as needed.   PSYLLIUM (REGULOID) 0.52 G CAPSULE    Take 0.52 g by mouth daily.  Modified Medications   No medications on file  Discontinued Medications   No medications on file    Physical Exam: Filed Vitals:   03/10/15 0836  BP: 128/86  Pulse: 84  Temp: 97.6 F (36.4 C)  TempSrc: Oral  Resp: 20  Height: 5\' 7"  (1.702 m)  Weight: 238 lb (107.956 kg)  SpO2: 95%   Physical Exam  Constitutional: He is oriented to person, place, and time. He appears well-developed and well-nourished. No distress.  HENT:  Head: Normocephalic and atraumatic.  Cardiovascular: Normal rate, regular rhythm, normal heart sounds and intact distal pulses.     Pulmonary/Chest: Effort normal and breath sounds normal. No respiratory distress.  Abdominal: Soft. Bowel sounds are normal. He exhibits no distension. There is no tenderness.  Musculoskeletal:  Minor limp with right knee, some edema persists  Neurological: He is alert and oriented to person, place, and time.  Skin: Skin is warm and dry.  Psychiatric: He has a normal mood and affect. His behavior is normal. Judgment and thought content normal.    Labs reviewed: Basic Metabolic Panel:  Recent Labs  06/05/14 09/02/14 1036 12/06/14 0851  NA 143 142 140  K 4.0 3.8 4.3  CL  --  102 104  CO2  --  26 21  GLUCOSE  --  123* 112*  BUN 12 11 11   CREATININE  --  0.91 0.99  CALCIUM  --  8.7 8.9   Liver Function Tests: No results for input(s): AST, ALT, ALKPHOS, BILITOT, PROT, ALBUMIN in the last 8760 hours. No results for input(s): LIPASE, AMYLASE in the last 8760 hours. No results for input(s): AMMONIA in the last 8760 hours. CBC: No results for input(s): WBC, NEUTROABS, HGB, HCT, MCV, PLT in the last 8760 hours. Lipid Panel:  Recent Labs  06/04/14 06/05/14 12/06/14 0851  CHOL 122  --  99*  HDL  --   --  45  LDLCALC  --  51 39  TRIG  --  101 76  CHOLHDL  --   --  2.2   Lab Results  Component Value Date   HGBA1C 6.6* 12/06/2014   Assessment/Plan 1. Knee swelling, right -xrays showed only mild age-related OA -still limping and having swollen -await MRI  2. Right medial knee pain -suspect partial meniscal tear -await MRI   3. Diabetes mellitus type II, controlled - cont trulicity, metformin with good control  - Hemoglobin A1c; Future - Lipid panel; Future  4. Essential hypertension, benign - bp at goal with lotensin hct - CBC with Differential/Platelet; Future - Basic metabolic panel; Future  5. Obesity -cont trulicity and metformin -will see if weight improves with the trulicity -exercise with walking limited with his current knee edema and pain - Lipid  panel; Future  6. Edema -improved with discontinuing actos--cont to monitor - Basic metabolic panel; Future  7. Hyperlipidemia - cont lipitor 40mg  daily and dietary changes to lower cholesterol--was well controlled last check - Hemoglobin A1c - Lipid panel - Basic metabolic panel  Labs/tests ordered:   Orders Placed This Encounter  Procedures  . Hemoglobin A1c    Standing Status: Future     Number of Occurrences:      Standing Expiration Date: 09/07/2015  . Lipid panel    Standing Status: Future  Number of Occurrences:      Standing Expiration Date: 09/07/2015    Order Specific Question:  Has the patient fasted?    Answer:  Yes  . CBC with Differential/Platelet    Standing Status: Future     Number of Occurrences:      Standing Expiration Date: 09/07/2015  . Basic metabolic panel    Standing Status: Future     Number of Occurrences:      Standing Expiration Date: 09/07/2015    Order Specific Question:  Has the patient fasted?    Answer:  Yes    Next appt:  3 mos med mgt with labs before  [place] Undine Nealis, D.O. [facility] [address], [place] [address] Cell Phone (Mon-Fri 8am-5pm):  [phone] On Call:  [phone] & follow prompts after 5pm & weekends Office Phone:  [phone] Office Fax:  [phone]

## 2015-03-11 LAB — BASIC METABOLIC PANEL
BUN/Creatinine Ratio: 15 (ref 9–20)
BUN: 13 mg/dL (ref 6–24)
CO2: 26 mmol/L (ref 18–29)
Calcium: 9 mg/dL (ref 8.7–10.2)
Chloride: 101 mmol/L (ref 97–108)
Creatinine, Ser: 0.88 mg/dL (ref 0.76–1.27)
GFR calc Af Amer: 109 mL/min/{1.73_m2} (ref >59)
GFR calc non Af Amer: 94 mL/min/{1.73_m2} (ref >59)
Glucose: 105 mg/dL — ABNORMAL HIGH (ref 65–99)
Potassium: 4 mmol/L (ref 3.5–5.2)
Sodium: 145 mmol/L — ABNORMAL HIGH (ref 134–144)

## 2015-03-11 LAB — HEMOGLOBIN A1C
Est. average glucose Bld gHb Est-mCnc: 148 mg/dL
Hgb A1c MFr Bld: 6.8 % — ABNORMAL HIGH (ref 4.8–5.6)

## 2015-03-11 LAB — LIPID PANEL
Chol/HDL Ratio: 2.2 ratio units (ref 0.0–5.0)
Cholesterol, Total: 97 mg/dL — ABNORMAL LOW (ref 100–199)
HDL: 45 mg/dL (ref >39)
LDL Calculated: 35 mg/dL (ref 0–99)
Triglycerides: 83 mg/dL (ref 0–149)
VLDL Cholesterol Cal: 17 mg/dL (ref 5–40)

## 2015-03-12 ENCOUNTER — Other Ambulatory Visit: Payer: Self-pay | Admitting: Internal Medicine

## 2015-03-12 ENCOUNTER — Ambulatory Visit (HOSPITAL_COMMUNITY)
Admission: RE | Admit: 2015-03-12 | Discharge: 2015-03-12 | Disposition: A | Discharge status: Home / Self Care | Payer: PRIVATE HEALTH INSURANCE | Source: Ambulatory Visit | Attending: Internal Medicine | Admitting: Internal Medicine

## 2015-03-12 DIAGNOSIS — S83249A Other tear of medial meniscus, current injury, unspecified knee, initial encounter: Secondary | ICD-10-CM | POA: Diagnosis not present

## 2015-03-12 DIAGNOSIS — X58XXXA Exposure to other specified factors, initial encounter: Secondary | ICD-10-CM | POA: Insufficient documentation

## 2015-03-12 DIAGNOSIS — M7121 Synovial cyst of popliteal space [Baker], right knee: Secondary | ICD-10-CM | POA: Diagnosis not present

## 2015-03-12 DIAGNOSIS — M25561 Pain in right knee: Secondary | ICD-10-CM

## 2015-03-12 DIAGNOSIS — S83241A Other tear of medial meniscus, current injury, right knee, initial encounter: Secondary | ICD-10-CM

## 2015-04-05 ENCOUNTER — Other Ambulatory Visit: Payer: Self-pay | Admitting: Internal Medicine

## 2015-04-15 LAB — HM DIABETES EYE EXAM

## 2015-05-21 HISTORY — PX: SKIN BIOPSY: SHX1

## 2015-06-09 ENCOUNTER — Encounter: Payer: Self-pay | Admitting: Internal Medicine

## 2015-06-09 ENCOUNTER — Ambulatory Visit (INDEPENDENT_AMBULATORY_CARE_PROVIDER_SITE_OTHER): Payer: PRIVATE HEALTH INSURANCE | Admitting: Internal Medicine

## 2015-06-09 VITALS — BP 154/92 | HR 83 | Temp 98.1°F | Ht 68.0 in | Wt 237.0 lb

## 2015-06-09 DIAGNOSIS — E119 Type 2 diabetes mellitus without complications: Secondary | ICD-10-CM | POA: Diagnosis not present

## 2015-06-09 DIAGNOSIS — M84361D Stress fracture, right tibia, subsequent encounter for fracture with routine healing: Secondary | ICD-10-CM

## 2015-06-09 DIAGNOSIS — D0461 Carcinoma in situ of skin of right upper limb, including shoulder: Secondary | ICD-10-CM | POA: Diagnosis not present

## 2015-06-09 DIAGNOSIS — S83241D Other tear of medial meniscus, current injury, right knee, subsequent encounter: Secondary | ICD-10-CM

## 2015-06-09 DIAGNOSIS — S83241A Other tear of medial meniscus, current injury, right knee, initial encounter: Secondary | ICD-10-CM | POA: Insufficient documentation

## 2015-06-09 DIAGNOSIS — E785 Hyperlipidemia, unspecified: Secondary | ICD-10-CM | POA: Diagnosis not present

## 2015-06-09 DIAGNOSIS — I10 Essential (primary) hypertension: Secondary | ICD-10-CM

## 2015-06-09 DIAGNOSIS — M84361A Stress fracture, right tibia, initial encounter for fracture: Secondary | ICD-10-CM | POA: Insufficient documentation

## 2015-06-09 NOTE — Patient Instructions (Addendum)
Please make an appt to see me if your twice weekly bp readings are staying over [id number] systolic.   Change to regular mucinex Cut back on advil Pay attention to sodium intake

## 2015-06-09 NOTE — Progress Notes (Signed)
Patient ID: [name], male   DOB: 20-Jul-1955, 59 y.o.   MRN: [id number]   Location: [place] Provider: Rexene Edison. Mariea Clonts, D.O., C.M.D.  Code Status: DNR Goals of Care: Advanced Directive information Does patient have an advance directive?: No, Would patient like information on creating an advanced directive?: Yes - Educational materials given  Chief Complaint  Patient presents with  . Medical Management of Chronic Issues    4 month check blood sugar, blood pressure, cholesterol  . Fracture    03/12/15 right knee medial tibial plateau insufficiency fracture. Took Meloxicam once, when back to Advil  . Skin Cancer    right forearm squamous cell    HPI: Patient is a 59 y.o. male seen in the office today for med mgt of chronic diseases including diabetes mellitus II, htn, hyperlipidemia and recent difficulties with right medial and tibial plateau insufficiency fracture and right forearm squamous cell carcinoma.    At first had to ice it down at night (right knee).  Still gets occasional pain from where the stress fracture is.  He's wearing the knee brace since the end of Sept.  Saw Dr. Erlinda Hong on 11/7 and was told to keep wearing it. Only feels stress fracture if he walks more.    Torn meniscus--only option is for it to be removed due to the location being posteriorly.  Depending on level of pain, would get surgery.  Feels stretched out at times when he's stepping unevenly or steps are very high.  Not a constant pain.  Feels it, then it's gone.  Would want to do it in the summer if he decides on surgery.  He's just to return if he decides on surgery.  Cannot exercise with this problem.  Takes 2 advil at bedtime.   Was still getting a  little swelling in the right ankle.    Squamous cell ca:  Had a second biopsy on the right forearm--biopsy was precancerous.  An inch proximally, new spot biopsied which was infected follicle.    DMII:  Need to reassess sugar average.  Is taking the  trulicity which makes him feel nauseous if he eats more starchy food.  On metformin 1000mg  po bid.  On statin for cholesterol and baby asa and benazepril.  He is down 5 lbs since September.    BP up today, but rushing around at work this am.  Also on advil for his pain and having some pain in the right leg.  Discuss OSA again in March.  Review of Systems:  Review of Systems  Constitutional: Negative for fever and chills.  HENT: Negative for congestion and hearing loss.   Eyes: Negative for blurred vision.  Respiratory: Negative for shortness of breath.   Cardiovascular: Positive for leg swelling. Negative for chest pain.       Some mild residual edema of right leg  Gastrointestinal: Negative for abdominal pain.  Genitourinary: Negative for dysuria.  Musculoskeletal: Positive for myalgias and joint pain. Negative for falls.  Skin:       Squamous cell ca on right forearm  Neurological: Negative for dizziness.  Psychiatric/Behavioral: Negative for memory loss.    Past Medical History  Diagnosis Date  . Hypertension   . Diabetes mellitus without complication (HCC)     Type 2  . H/O lymph node excision [place] , [place]  . Viral pneumonia 1965    Past Surgical History  Procedure Laterality Date  . Skin biopsy Right 05/21/15  forearm Robyne Askew PA    No Known Allergies    Medication List       This list is accurate as of: 06/09/15 11:02 AM.  Always use your most recent med list.               ADVIL 200 MG Caps  Generic drug:  Ibuprofen  Take by mouth. Take two at bedtime     aspirin 81 MG tablet  Take 81 mg by mouth daily.     atorvastatin 40 MG tablet  Commonly known as:  LIPITOR  Take 1 tablet (40 mg total) by mouth daily.     benazepril-hydrochlorthiazide 10-12.5 MG tablet  Commonly known as:  LOTENSIN HCT  TAKE ONE TABLET BY MOUTH ONCE DAILY     cetirizine 10 MG tablet  Commonly known as:  ZYRTEC  Take 10 mg by mouth daily.     Dulaglutide  0.75 MG/0.5ML Sopn  Commonly known as:  TRULICITY  Inject [id number] mg into the skin once a week.     fluticasone 50 MCG/ACT nasal spray  Commonly known as:  FLONASE  Place 2 sprays into both nostrils daily.     metFORMIN 1000 MG tablet  Commonly known as:  GLUCOPHAGE  Take 1 tablet (1,000 mg total) by mouth 2 (two) times daily.     MUCINEX DM 30-600 MG Tb12  Take 1 tablet by mouth. One twice daily     PROCTOSOL HC 2.5 % rectal cream  Generic drug:  hydrocortisone  as needed.     psyllium 0.52 G capsule  Commonly known as:  REGULOID  Take 0.52 g by mouth daily.        Health Maintenance  Topic Date Due  . PNEUMOCOCCAL POLYSACCHARIDE VACCINE (2) 03/22/2011  . OPHTHALMOLOGY EXAM  03/22/2015  . FOOT EXAM  09/02/2015  . HEMOGLOBIN A1C  09/07/2015  . INFLUENZA VACCINE  01/20/2016  . TETANUS/TDAP  01/19/2017  . COLONOSCOPY  02/22/2024  . Hepatitis C Screening  Completed  . HIV Screening  Addressed    Physical Exam: Filed Vitals:   06/09/15 1057  BP: 154/92  Pulse: 83  Temp: 98.1 F (36.7 C)  TempSrc: Oral  Height: 5\' 8"  (1.727 m)  Weight: 237 lb (107.502 kg)  SpO2: 96%   Body mass index is 36.04 kg/(m^2). Physical Exam  Constitutional: He is oriented to person, place, and time. He appears well-developed and well-nourished.  HENT:  Head: Normocephalic and atraumatic.  Eyes:  glasses  Cardiovascular: Normal rate, regular rhythm, normal heart sounds and intact distal pulses.   Pulmonary/Chest: Effort normal and breath sounds normal. No respiratory distress.  Abdominal: Soft. Bowel sounds are normal.  Musculoskeletal:  Only slight limp now on right leg; tenderness of right medial knee and over tibial plateau  Neurological: He is alert and oriented to person, place, and time.  Skin: Skin is warm and dry.  Two bandaids on right forearm from derm procedures  Psychiatric: He has a normal mood and affect.    Labs reviewed: Basic Metabolic Panel:  Recent Labs   09/02/14 1036 12/06/14 0851 03/10/15 0909  NA 142 140 145*  K 3.8 4.3 4.0  CL 102 104 101  CO2 26 21 26   GLUCOSE 123* 112* 105*  BUN 11 11 13   CREATININE 0.91 0.99 0.88  CALCIUM 8.7 8.9 9.0   Liver Function Tests: No results for input(s): AST, ALT, ALKPHOS, BILITOT, PROT, ALBUMIN in the last 8760 hours. No results for input(s): LIPASE,  AMYLASE in the last 8760 hours. No results for input(s): AMMONIA in the last 8760 hours. CBC: No results for input(s): WBC, NEUTROABS, HGB, HCT, MCV, PLT in the last 8760 hours. Lipid Panel:  Recent Labs  12/06/14 0851 03/10/15 0909  CHOL 99* 97*  HDL 45 45  LDLCALC 39 35  TRIG 76 83  CHOLHDL 2.2 2.2   Lab Results  Component Value Date   HGBA1C 6.8* 03/10/2015    Procedures since last visit: MRI right knee w/o contrast was reviewed again today from 03/12/15 and notes from orthopedics:  1. Radial tear of the posterior horn of the medial meniscus with peripheral meniscal extrusion. 2. Nondisplaced subchondral insufficiency fracture of the medial femoral condyle with surrounding marrow edema. 3. High-grade partial-thickness cartilage loss of the medial femoral condyle and medial tibial plateau. 4. Small full-thickness cartilage defect involving the lateral patellar facet measuring 3 mm. Partial-thickness cartilage loss of the patellar apex. 5. Small leaking Baker's cyst.  Assessment/Plan 1. Controlled type 2 diabetes mellitus without complication, without long-term current use of insulin ([place]) - reassess control, had been getting great benefit from trulicity but mobility has been limited since his knee injury -cont benazepil, asa, trulicity, lipitor, metformin and monitor; cont to work on dietary changes--has lost 5 lbs with the medication  - Hemoglobin [id number] - Basic metabolic panel  2. Stress fracture of right tibia, with routine healing, subsequent encounter -was wearing brace and this pain is gradually improving  3. Tear of  medial meniscus of right knee, subsequent encounter -rarely bothered by this, using advil--advised to be careful with this -encouraged tylenol instead -cont ice as needed -being followed by orthopedics--Dr. Phoebe Sharps notes reviewed -is trying to decide on whether to have surgery or not, but will probably wait until summer anyway  4. Essential hypertension, benign -bp elevated today with nsaids, stressful morning and pain, cont to monitor and cont ace/hctz combo - Basic metabolic panel  5. Hyperlipidemia -f/u flp today, is fasting, LDL was at goal in Sept, cont lipitor  6. Squamous cell carcinoma in situ of skin of right forearm -continues to follow with Dr. Denna Haggard, et al, saw NP last visit -notes reviewed.  Labs/tests ordered:   Orders Placed This Encounter  Procedures  . Hemoglobin A1c  . Basic metabolic panel    Next appt:  09/25/2015 annual exam with diabetic foot exam, discuss OSA  Jenavie Stanczak L. Memorie Yokoyama, D.O. [facility] [address], [place] [address] Cell Phone (Mon-Fri 8am-5pm):  [phone] On Call:  [phone] & follow prompts after 5pm & weekends Office Phone:  [phone] Office Fax:  [phone]

## 2015-06-10 ENCOUNTER — Telehealth: Payer: Self-pay

## 2015-06-10 LAB — BASIC METABOLIC PANEL
BUN/Creatinine Ratio: 12 (ref 9–20)
BUN: 11 mg/dL (ref 6–24)
CO2: 26 mmol/L (ref 18–29)
Calcium: 9.1 mg/dL (ref 8.7–10.2)
Chloride: 101 mmol/L (ref 96–106)
Creatinine, Ser: 0.91 mg/dL (ref 0.76–1.27)
GFR calc Af Amer: 106 mL/min/{1.73_m2} (ref >59)
GFR calc non Af Amer: 92 mL/min/{1.73_m2} (ref >59)
Glucose: 115 mg/dL — ABNORMAL HIGH (ref 65–99)
Potassium: 3.7 mmol/L (ref 3.5–5.2)
Sodium: 143 mmol/L (ref 134–144)

## 2015-06-10 LAB — HEMOGLOBIN A1C
Est. average glucose Bld gHb Est-mCnc: 160 mg/dL
Hgb A1c MFr Bld: 7.2 % — ABNORMAL HIGH (ref 4.8–5.6)

## 2015-06-10 MED ORDER — LINAGLIPTIN 5 MG PO TABS: 5.0000 mg | ORAL_TABLET | Freq: Every day | ORAL | Status: DC

## 2015-06-10 NOTE — Telephone Encounter (Signed)
Discussed with patient, patient verbalized understanding of results. Patient ok to restart medication. Patient viewed results on mychart.

## 2015-06-10 NOTE — Telephone Encounter (Signed)
-----   Message from Gayland Curry, DO sent at 06/10/2015 10:29 AM EST ----- Sugar average has gone up with decreased activity with his knee pain.  I recommend he cut back more on his sweets and starchy foods.  I know exercise is limited until he fully recovers from the stress fracture and decides if he's going forward with the meniscal repair.  Until then, let's add tradjenta 5mg  daily at his largest meal to help keep his glucose levels under control.  Electrolytes and kidneys were normal.

## 2015-07-03 ENCOUNTER — Other Ambulatory Visit: Payer: Self-pay | Admitting: Internal Medicine

## 2015-07-05 ENCOUNTER — Other Ambulatory Visit: Payer: Self-pay | Admitting: Internal Medicine

## 2015-08-21 ENCOUNTER — Encounter: Payer: Self-pay | Admitting: Internal Medicine

## 2015-09-22 ENCOUNTER — Ambulatory Visit (INDEPENDENT_AMBULATORY_CARE_PROVIDER_SITE_OTHER): Payer: PRIVATE HEALTH INSURANCE | Admitting: Internal Medicine

## 2015-09-22 ENCOUNTER — Encounter: Payer: Self-pay | Admitting: Internal Medicine

## 2015-09-22 VITALS — BP 118/78 | HR 99 | Temp 98.2°F | Resp 20 | Ht 68.0 in | Wt 223.8 lb

## 2015-09-22 DIAGNOSIS — I10 Essential (primary) hypertension: Secondary | ICD-10-CM

## 2015-09-22 DIAGNOSIS — M84361D Stress fracture, right tibia, subsequent encounter for fracture with routine healing: Secondary | ICD-10-CM

## 2015-09-22 DIAGNOSIS — E669 Obesity, unspecified: Secondary | ICD-10-CM | POA: Diagnosis not present

## 2015-09-22 DIAGNOSIS — E785 Hyperlipidemia, unspecified: Secondary | ICD-10-CM

## 2015-09-22 DIAGNOSIS — S83241D Other tear of medial meniscus, current injury, right knee, subsequent encounter: Secondary | ICD-10-CM

## 2015-09-22 DIAGNOSIS — E119 Type 2 diabetes mellitus without complications: Secondary | ICD-10-CM | POA: Diagnosis not present

## 2015-09-22 MED ORDER — DULAGLUTIDE 0.75 MG/0.5ML ~~LOC~~ SOAJ: 0.7500 mg | SUBCUTANEOUS | Status: DC

## 2015-09-22 MED ORDER — METFORMIN HCL 1000 MG PO TABS: 1000.0000 mg | ORAL_TABLET | Freq: Two times a day (BID) | ORAL | Status: DC

## 2015-09-22 NOTE — Patient Instructions (Signed)
Please call [facility] and ask them to send [place] your eye exam report for your health maintenance record.

## 2015-09-22 NOTE — Progress Notes (Signed)
Patient ID: [name], male   DOB: 08-03-1955, 60 y.o.   MRN: [id number]   Location:  [facility] clinic Provider:  Zamariah Seaborn L. Mariea Clonts, D.O., C.M.D.  Code Status: full code Goals of Care:  Advanced Directives 09/22/2015  Does patient have an advance directive? No  Would patient like information on creating an advanced directive? Yes - Geneticist, molecular Complaint  Patient presents with  . Follow-up    follow-up on B/P and Blood sugar    HPI: Patient is a 60 y.o. male seen today for medical management of chronic diseases.    BP is down.  Has stopped salting food.  Stopped advil also. Changed to regular mucinex.   Feet are no longer swollen.    DMII:  hba1c was improving.  Back on tradjenta since December.    Still having some pain in his right leg.  Is walking better.  Still wears his brace.  Is headed back to orthopedics.  Thinks he is going to get his meniscus cleaned out.  If goes out with the dog w/o the brace, he can tell the difference.  Wants to deny it, but thinks he needs to do something about it.  Must take steps one at a time.    Premalignant area on right forearm and another area with infected follicle:  Never got card to f/u with derm.  Thinks he needs to call them.    Still needs the sleep apnea eval, but wants to take one problem at a time.  Also has a dental implant coming this summer.  Last time had some bleeding with it.    Past Medical History  Diagnosis Date  . Hypertension   . Diabetes mellitus without complication (HCC)     Type 2  . H/O lymph node excision [place] , [place]  . Viral pneumonia 1965    Past Surgical History  Procedure Laterality Date  . Skin biopsy Right 05/21/15    forearm Robyne Askew PA    No Known Allergies    Medication List       This list is accurate as of: 09/22/15 11:17 AM.  Always use your most recent med list.               aspirin 81 MG tablet  Take 81 mg by mouth daily.     atorvastatin 40 MG  tablet  Commonly known as:  LIPITOR  TAKE ONE TABLET BY MOUTH ONCE DAILY     benazepril-hydrochlorthiazide 10-12.5 MG tablet  Commonly known as:  LOTENSIN HCT  TAKE ONE TABLET BY MOUTH ONCE DAILY     cetirizine 10 MG tablet  Commonly known as:  ZYRTEC  Take 10 mg by mouth daily.     Dulaglutide 0.75 MG/0.5ML Sopn  Commonly known as:  TRULICITY  Inject A999333 mg into the skin once a week.     fluticasone 50 MCG/ACT nasal spray  Commonly known as:  FLONASE  USE TWO SPRAY(S) IN EACH NOSTRIL ONCE DAILY     linagliptin 5 MG Tabs tablet  Commonly known as:  TRADJENTA  Take 1 tablet (5 mg total) by mouth daily.     metFORMIN 1000 MG tablet  Commonly known as:  GLUCOPHAGE  Take 1 tablet (1,000 mg total) by mouth 2 (two) times daily.     MUCINEX ALLERGY PO  Take by mouth. 1 tablet by mouth twice a day     PROCTOSOL HC 2.5 % rectal cream  Generic drug:  hydrocortisone  as needed.     psyllium 0.52 g capsule  Commonly known as:  REGULOID  Take 0.52 g by mouth daily.       Review of Systems:  Review of Systems  Constitutional: Negative for fever, chills and malaise/fatigue.  HENT: Negative for hearing loss.   Eyes: Negative for blurred vision.  Respiratory: Negative for cough and shortness of breath.   Cardiovascular: Negative for chest pain and leg swelling.  Gastrointestinal: Negative for abdominal pain.  Genitourinary: Negative for dysuria, urgency and frequency.  Musculoskeletal: Positive for joint pain. Negative for falls.       Right leg still bothersome with walking  Skin:       Need derm f/u for right arm  Neurological: Negative for dizziness, loss of consciousness and weakness.  Endo/Heme/Allergies:       Diabetes  Psychiatric/Behavioral: Negative for depression and memory loss.    Health Maintenance  Topic Date Due  . PNEUMOCOCCAL POLYSACCHARIDE VACCINE (2) 03/22/2011  . OPHTHALMOLOGY EXAM  03/22/2015  . FOOT EXAM  09/02/2015  . HEMOGLOBIN A1C  12/08/2015    . INFLUENZA VACCINE  01/20/2016  . TETANUS/TDAP  01/19/2017  . COLONOSCOPY  02/22/2024  . Hepatitis C Screening  Completed  . HIV Screening  Addressed    Physical Exam: Filed Vitals:   09/22/15 1108  BP: 126/92  Pulse: 99  Temp: 98.2 F (36.8 C)  TempSrc: Oral  Resp: 20  Height: 5\' 8"  (1.727 m)  Weight: 223 lb 12.8 oz (101.515 kg)  SpO2: 96%   Body mass index is 34.04 kg/(m^2). Physical Exam  Constitutional: He is oriented to person, place, and time. He appears well-developed and well-nourished. No distress.  Cardiovascular: Normal rate, regular rhythm, normal heart sounds and intact distal pulses.   Pulmonary/Chest: Effort normal and breath sounds normal. No respiratory distress.  Abdominal: Soft. Bowel sounds are normal. He exhibits no distension. There is no tenderness.  Musculoskeletal: Normal range of motion.  Still limping slightly  Neurological: He is alert and oriented to person, place, and time.  Skin: Skin is warm and dry.  Psychiatric: He has a normal mood and affect.    Labs reviewed: Basic Metabolic Panel:  Recent Labs  12/06/14 0851 03/10/15 0909 06/09/15 1149  NA 140 145* 143  K 4.3 4.0 3.7  CL 104 101 101  CO2 21 26 26   GLUCOSE 112* 105* 115*  BUN 11 13 11   CREATININE 0.99 0.88 0.91  CALCIUM 8.9 9.0 9.1   Liver Function Tests: No results for input(s): AST, ALT, ALKPHOS, BILITOT, PROT, ALBUMIN in the last 8760 hours. No results for input(s): LIPASE, AMYLASE in the last 8760 hours. No results for input(s): AMMONIA in the last 8760 hours. CBC: No results for input(s): WBC, NEUTROABS, HGB, HCT, MCV, PLT in the last 8760 hours. Lipid Panel:  Recent Labs  12/06/14 0851 03/10/15 0909  CHOL 99* 97*  HDL 45 45  LDLCALC 39 35  TRIG 76 83  CHOLHDL 2.2 2.2   Lab Results  Component Value Date   HGBA1C 7.2* 06/09/2015    Assessment/Plan 1. Controlled type 2 diabetes mellitus without complication, without long-term current use of insulin  ([place]) -had worsened hba1c so was put back on tradjenta last time -cont trulicity, metformin and tradjenta and see where his glucose is now: - Hemoglobin [id number] - Basic metabolic panel  - Hemoglobin A1c; Future - Lipid panel; Future  2. Tear of medial meniscus of right knee, subsequent  encounter -plans to f/u with orthopedics for possible arthroscopic surgery to get this take care of  3. Stress fracture of right tibia, with routine healing, subsequent encounter -seems this part has improved -no longer using advil  4. Essential hypertension, benign -bp much better on recheck  5. Hyperlipidemia -cholesterol was improving with statin therapy - Lipid panel; Future  6. Obesity - cont to work on diet and exercise, cont trulicity.  Has lost 14 lbs since december - Hemoglobin A1c; Future - Lipid panel; Future  Labs/tests ordered:   Orders Placed This Encounter  Procedures  . Hemoglobin A1c  . Basic metabolic panel  . Hemoglobin A1c    Standing Status: Future     Number of Occurrences:      Standing Expiration Date: 03/23/2016  . Lipid panel    Standing Status: Future     Number of Occurrences:      Standing Expiration Date: 03/23/2016    Order Specific Question:  Has the patient fasted?    Answer:  Yes   Next appt:  01/12/2016 for annual (afternoon appt is scheduled) with fasting labs before (he's going to call for the lab appt a few days before the physical)   Cerina Leary L. Destenee Guerry, D.O. [facility] [address], [place] [address] Cell Phone (Mon-Fri 8am-5pm):  [phone] On Call:  [phone] & follow prompts after 5pm & weekends Office Phone:  [phone] Office Fax:  [phone]

## 2015-09-23 LAB — BASIC METABOLIC PANEL
BUN/Creatinine Ratio: 11 (ref 9–20)
BUN: 10 mg/dL (ref 6–24)
CO2: 23 mmol/L (ref 18–29)
Calcium: 8.9 mg/dL (ref 8.7–10.2)
Chloride: 100 mmol/L (ref 96–106)
Creatinine, Ser: 0.89 mg/dL (ref 0.76–1.27)
GFR calc Af Amer: 108 mL/min/{1.73_m2} (ref >59)
GFR calc non Af Amer: 94 mL/min/{1.73_m2} (ref >59)
Glucose: 104 mg/dL — ABNORMAL HIGH (ref 65–99)
Potassium: 4.3 mmol/L (ref 3.5–5.2)
Sodium: 141 mmol/L (ref 134–144)

## 2015-09-23 LAB — HEMOGLOBIN A1C
Est. average glucose Bld gHb Est-mCnc: 146 mg/dL
Hgb A1c MFr Bld: 6.7 % — ABNORMAL HIGH (ref 4.8–5.6)

## 2015-09-25 ENCOUNTER — Encounter: Payer: PRIVATE HEALTH INSURANCE | Admitting: Internal Medicine

## 2015-09-30 ENCOUNTER — Encounter: Payer: Self-pay | Admitting: *Deleted

## 2015-10-20 ENCOUNTER — Other Ambulatory Visit: Payer: Self-pay | Admitting: Internal Medicine

## 2015-12-20 ENCOUNTER — Other Ambulatory Visit: Payer: Self-pay | Admitting: Internal Medicine

## 2015-12-22 ENCOUNTER — Encounter: Payer: Self-pay | Admitting: Internal Medicine

## 2015-12-27 ENCOUNTER — Other Ambulatory Visit: Payer: Self-pay | Admitting: Internal Medicine

## 2016-01-09 ENCOUNTER — Other Ambulatory Visit: Payer: PRIVATE HEALTH INSURANCE

## 2016-01-09 DIAGNOSIS — E785 Hyperlipidemia, unspecified: Secondary | ICD-10-CM

## 2016-01-09 DIAGNOSIS — E119 Type 2 diabetes mellitus without complications: Secondary | ICD-10-CM

## 2016-01-09 LAB — HEMOGLOBIN A1C
Hgb A1c MFr Bld: 6.8 % — ABNORMAL HIGH (ref <5.7)
Mean Plasma Glucose: 148 mg/dL

## 2016-01-09 LAB — LIPID PANEL
Cholesterol: 94 mg/dL — ABNORMAL LOW (ref 125–200)
HDL: 43 mg/dL (ref >=40)
LDL Cholesterol: 34 mg/dL (ref <130)
Total CHOL/HDL Ratio: 2.2 Ratio (ref <=5.0)
Triglycerides: 83 mg/dL (ref <150)
VLDL: 17 mg/dL (ref <30)

## 2016-01-12 ENCOUNTER — Encounter: Payer: Self-pay | Admitting: Internal Medicine

## 2016-01-12 ENCOUNTER — Ambulatory Visit (INDEPENDENT_AMBULATORY_CARE_PROVIDER_SITE_OTHER): Payer: PRIVATE HEALTH INSURANCE | Admitting: Internal Medicine

## 2016-01-12 VITALS — BP 130/80 | HR 89 | Temp 98.9°F | Ht 68.0 in | Wt 224.0 lb

## 2016-01-12 DIAGNOSIS — E669 Obesity, unspecified: Secondary | ICD-10-CM | POA: Diagnosis not present

## 2016-01-12 DIAGNOSIS — T884XXS Failed or difficult intubation, sequela: Secondary | ICD-10-CM

## 2016-01-12 DIAGNOSIS — M84361D Stress fracture, right tibia, subsequent encounter for fracture with routine healing: Secondary | ICD-10-CM

## 2016-01-12 DIAGNOSIS — S83241D Other tear of medial meniscus, current injury, right knee, subsequent encounter: Secondary | ICD-10-CM

## 2016-01-12 DIAGNOSIS — Z0000 Encounter for general adult medical examination without abnormal findings: Secondary | ICD-10-CM

## 2016-01-12 DIAGNOSIS — E119 Type 2 diabetes mellitus without complications: Secondary | ICD-10-CM | POA: Diagnosis not present

## 2016-01-12 DIAGNOSIS — I10 Essential (primary) hypertension: Secondary | ICD-10-CM | POA: Diagnosis not present

## 2016-01-12 NOTE — Progress Notes (Signed)
Location:  [facility] clinic Provider: Tinea Nobile L. Mariea Clonts, D.O., C.M.D.  Patient Care Team: Gayland Curry, DO as PCP - General (Geriatric Medicine) Lavonna Monarch, MD as Consulting Physician (Dermatology) Leandrew Koyanagi, MD as Attending Physician (Orthopedic Surgery) Roel Cluck, MD as Referring Physician (Ophthalmology)  Extended Emergency Contact Information Primary Emergency Contact: Labelle,Kathy Address: [address]          [place], [place] [address] Johnnette Litter of [place] Phone: [phone] Relation: Spouse  Code Status: full code Goals of Care: Advanced Directive information Advanced Directives 01/12/2016  Does patient have an advance directive? No  Copy of advanced directive(s) in chart? -  Would patient like information on creating an advanced directive? Yes - Scientist, clinical (histocompatibility and immunogenetics) given  Previously given living will/hcpoa paperwork and discussed, but not yet completed   Chief Complaint  Patient presents with  . Annual Exam    physical exam    HPI: Patient is a 60 y.o. male seen in today for an annual wellness exam.    5/9, saw Dr. Erlinda Hong and he opted not to offer to remove his meniscus.  Was given a cortisone shot for the swelling in the knee itself.  He wears his brace during the day.  He is slow maneuvering steps.  He can't bend it very well.  He is not having terrible pain except if he really extends his knee.  If pain got severe, he would see Dr. Erlinda Hong again for another MRI.  He was able to walk around the zoo.  Did stop appropriately though.  Cortisone injection was a big help.    His mother has been ill from her COPD and she apparently had an effusion and required drainage.  She lives with his sister.    Depression screen [facility] 2/9 01/12/2016 09/02/2014  Decreased Interest 0 0  Down, Depressed, Hopeless 0 0  PHQ - 2 Score 0 0    Fall Risk  01/12/2016 09/22/2015 03/10/2015 03/03/2015 02/27/2015  Falls in the past year? No No No No No    Health Maintenance  Topic  Date Due  . PNEUMOCOCCAL POLYSACCHARIDE VACCINE (2) 03/22/2011  . ZOSTAVAX  12/23/2015  . INFLUENZA VACCINE  01/20/2016  . OPHTHALMOLOGY EXAM  04/14/2016  . HEMOGLOBIN A1C  07/11/2016  . FOOT EXAM  01/11/2017  . TETANUS/TDAP  01/19/2017  . COLONOSCOPY  02/22/2024  . Hepatitis C Screening  Completed  . HIV Screening  Addressed   Functional Status Survey: Is the patient deaf or have difficulty hearing?: No Does the patient have difficulty seeing, even when wearing glasses/contacts?: No Does the patient have difficulty concentrating, remembering, or making decisions?: No Does the patient have difficulty walking or climbing stairs?: No Does the patient have difficulty dressing or bathing?: No Does the patient have difficulty doing errands alone such as visiting a doctor's office or shopping?: No Exercise? Current Exercise Habits: Home exercise routine, Type of exercise: walking, Time (Minutes): 30, Frequency (Times/Week): 5, Weekly Exercise (Minutes/Week): 150, Intensity: Mild Exercise limited by: orthopedic condition(s) Diet?  Tries to follow a healthy diabetic low sodium diet Vision Screening Comments: [facility] 04/15/15 Hearing:  No difficulty Dentition:  Had an extraction and for an implant coming up Pain:  Very minimal difficulty with the right leg when does stairs a lot.  Past Medical History:  Diagnosis Date  . Diabetes mellitus without complication (HCC)    Type 2  . H/O lymph node excision [place] , [place]  . Hypertension   .  Viral pneumonia 1965    Past Surgical History:  Procedure Laterality Date  . SKIN BIOPSY Right 05/21/15   forearm Robyne Askew PA    Social History   Social History  . Marital status: Married    Spouse name: N/A  . Number of children: N/A  . Years of education: N/A   Occupational History  . Not on file.   Social History Main Topics  . Smoking status: Never Smoker  . Smokeless tobacco: Never Used  . Alcohol use 0.6  oz/week    1 Standard drinks or equivalent per week     Comment: social  . Drug use: No  . Sexual activity: Not on file   Other Topics Concern  . Not on file   Social History Narrative   Diet: No soda, limited sweets such as candy and pastries.   Caffeine: Yes-Unsweetened ice tea   Marital status: Married  Year: 1983   Do you live in a  House, how many stories: 2   How many persons live in your home: 2 and 1 temporarily   Do you have any pets in the home:Yes, Dog   Do you exercise: yes, walking, several times per week    No Known Allergies    Medication List       Accurate as of 01/12/16 11:59 PM. Always use your most recent med list.          aspirin 81 MG tablet Take 81 mg by mouth daily.   atorvastatin 40 MG tablet Commonly known as:  LIPITOR TAKE ONE TABLET BY MOUTH ONCE DAILY   benazepril-hydrochlorthiazide 10-12.5 MG tablet Commonly known as:  LOTENSIN HCT TAKE ONE TABLET BY MOUTH ONCE DAILY   cetirizine 10 MG tablet Commonly known as:  ZYRTEC Take 10 mg by mouth daily.   Dulaglutide 0.75 MG/0.5ML Sopn Commonly known as:  TRULICITY Inject A999333 mg into the skin once a week.   fluticasone 50 MCG/ACT nasal spray Commonly known as:  FLONASE USE TWO SPRAY(S) IN EACH NOSTRIL ONCE DAILY   metFORMIN 1000 MG tablet Commonly known as:  GLUCOPHAGE TAKE ONE TABLET BY MOUTH TWICE DAILY   PROCTOSOL HC 2.5 % rectal cream Generic drug:  hydrocortisone as needed.   psyllium 0.52 g capsule Commonly known as:  REGULOID Take 0.52 g by mouth daily.   TRADJENTA 5 MG Tabs tablet Generic drug:  linagliptin TAKE ONE TABLET BY MOUTH ONCE DAILY        Review of Systems:  Review of Systems  Constitutional: Negative for chills, fever and malaise/fatigue.  HENT: Negative for hearing loss.   Eyes: Negative for blurred vision.  Respiratory: Negative for cough and shortness of breath.   Cardiovascular: Negative for chest pain and palpitations.  Gastrointestinal:  Negative for abdominal pain, blood in stool, constipation and melena.  Genitourinary: Negative for dysuria, frequency and urgency.  Musculoskeletal: Positive for joint pain.       Right knee  Skin:       A few more premalignant areas on right forearm  Neurological: Negative for dizziness, loss of consciousness and weakness.  Endo/Heme/Allergies: Does not bruise/bleed easily.  Psychiatric/Behavioral: Negative for depression and memory loss.    Physical Exam: Vitals:   01/12/16 1343  BP: 130/80  Pulse: 89  Temp: 98.9 F (37.2 C)  TempSrc: Oral  SpO2: 96%  Weight: 224 lb (101.6 kg)  Height: 5\' 8"  (1.727 m)   Body mass index is 34.06 kg/m. Physical Exam  Constitutional: He is  oriented to person, place, and time. He appears well-developed and well-nourished.  HENT:  Head: Normocephalic and atraumatic.  Right Ear: External ear normal.  Left Ear: External ear normal.  Nose: Nose normal.  Mouth/Throat: Oropharynx is clear and moist. No oropharyngeal exudate.  Eyes: Conjunctivae and EOM are normal. Pupils are equal, round, and reactive to light.  Neck: Normal range of motion. Neck supple. No JVD present.  Cardiovascular: Normal rate, regular rhythm, normal heart sounds and intact distal pulses.   Pulmonary/Chest: Effort normal and breath sounds normal. No respiratory distress.  Abdominal: Soft. Bowel sounds are normal. He exhibits no distension. There is no tenderness.  Musculoskeletal: Normal range of motion. He exhibits no edema.  Lymphadenopathy:    He has no cervical adenopathy.  Neurological: He is alert and oriented to person, place, and time. He has normal reflexes. No cranial nerve deficit.  Skin: Skin is warm and dry.  Small scar on forearm of right arm (dorsal surface) from skin surgery  Psychiatric: He has a normal mood and affect. His behavior is normal. Judgment and thought content normal.    Labs reviewed: Basic Metabolic Panel:  Recent Labs  03/10/15 0909  06/09/15 1149 09/22/15 1141  NA 145* 143 141  K 4.0 3.7 4.3  CL 101 101 100  CO2 26 26 23   GLUCOSE 105* 115* 104*  BUN 13 11 10   CREATININE 0.88 0.91 0.89  CALCIUM 9.0 9.1 8.9   Liver Function Tests: No results for input(s): AST, ALT, ALKPHOS, BILITOT, PROT, ALBUMIN in the last 8760 hours. No results for input(s): LIPASE, AMYLASE in the last 8760 hours. No results for input(s): AMMONIA in the last 8760 hours. CBC: No results for input(s): WBC, NEUTROABS, HGB, HCT, MCV, PLT in the last 8760 hours. Lipid Panel:  Recent Labs  03/10/15 0909 01/09/16 0840  CHOL 97* 94*  HDL 45 43  LDLCALC 35 34  TRIG 83 83  CHOLHDL 2.2 2.2   Lab Results  Component Value Date   HGBA1C 6.8 (H) 01/09/2016    Assessment/Plan 1. Encounter for Medicare annual wellness exam -is up to date on his testing and immunizations except he can get zoster here, but we need to get some in the office for him to take in Nov  2. Controlled type 2 diabetes mellitus without complication, without long-term current use of insulin ([place]) -well controlled with trulicity and metformin -previously had side effect from pioglitazone of swelling of his legs -now doing better off this and has lost weight with trulicity -sample provided of trulicity today (has been on since about sept) -he would like to continue as he has lost weight despite his orthopedic limitations lately -eye exams in fall at [facility] -foot exam normal today  3. Tear of medial meniscus of right knee, subsequent encounter -no plans for surgery at this time; did get steroid injection which likely slightly trended hba1c up  4. Stress fracture of right tibia, with routine healing, subsequent encounter - healed, using brace to support meniscal tear  5. Essential hypertension, benign -bp at goal with current regimen, no changes, is on ace  6.Obesity -weight stable since last visit three mos ago and had been trending down before -likely the  steroid shot leveled it out -cont walking for exercise and low sodium diabetic diet  Labs/tests ordered:   Orders Placed This Encounter  Procedures  . Basic metabolic panel    Standing Status:   Future    Standing Expiration Date:   09/11/2016  .  Hemoglobin A1c    Standing Status:   Future    Standing Expiration Date:   09/11/2016  . Nocturnal polysomnography (NPSG)    Standing Status:   Future    Standing Expiration Date:   01/11/2017    Order Specific Question:   Where should this test be performed:    Answer:   [facility]    Next appt:  05/07/2016 with labs before  [place]. Torianna Junio, D.O. [facility] [address], [place] [address] Cell Phone (Mon-Fri 8am-5pm):  [phone] On Call:  [phone] & follow prompts after 5pm & weekends Office Phone:  [phone] Office Fax:  [phone]

## 2016-01-28 ENCOUNTER — Telehealth: Payer: Self-pay | Admitting: Internal Medicine

## 2016-02-14 ENCOUNTER — Other Ambulatory Visit: Payer: Self-pay | Admitting: Internal Medicine

## 2016-03-03 ENCOUNTER — Ambulatory Visit (HOSPITAL_BASED_OUTPATIENT_CLINIC_OR_DEPARTMENT_OTHER): Payer: PRIVATE HEALTH INSURANCE | Attending: Internal Medicine | Admitting: Internal Medicine

## 2016-03-03 VITALS — Ht 69.0 in | Wt 220.0 lb

## 2016-03-03 DIAGNOSIS — E669 Obesity, unspecified: Secondary | ICD-10-CM | POA: Diagnosis present

## 2016-03-03 DIAGNOSIS — Z6832 Body mass index (BMI) 32.0-32.9, adult: Secondary | ICD-10-CM | POA: Diagnosis not present

## 2016-03-03 DIAGNOSIS — T884XXS Failed or difficult intubation, sequela: Secondary | ICD-10-CM

## 2016-03-03 DIAGNOSIS — G4733 Obstructive sleep apnea (adult) (pediatric): Secondary | ICD-10-CM

## 2016-03-13 DIAGNOSIS — E669 Obesity, unspecified: Secondary | ICD-10-CM | POA: Diagnosis not present

## 2016-03-13 NOTE — Procedures (Signed)
  Patient Name: [name], [name] Date: 03/03/2016 Gender: Male D.O.B: 08-15-1955 Age (years): 23 Referring Provider: Gayland Curry DO Height (inches): 52 Interpreting Physician: Baird Lyons MD, ABSM Weight (lbs): 220 RPSGT: Zadie Rhine BMI: 32 MRN: [id number] Neck Size: 17.00 CLINICAL INFORMATION Sleep Study Type: NPSG   Indication for sleep study: Obesity   Epworth Sleepiness Score: 10/24   SLEEP STUDY TECHNIQUE As per the AASM Manual for the Scoring of Sleep and Associated Events v2.3 (April 2016) with a hypopnea requiring 4% desaturations. The channels recorded and monitored were frontal, central and occipital EEG, electrooculogram (EOG), submentalis EMG (chin), nasal and oral airflow, thoracic and abdominal wall motion, anterior tibialis EMG, snore microphone, electrocardiogram, and pulse oximetry.  MEDICATIONS Patient's medications include: charted for review. Medications self-administered by patient during sleep study : No sleep medicine administered.  SLEEP ARCHITECTURE The study was initiated at 10:45:45 PM and ended at 4:37:39 AM. Sleep onset time was 25.1 minutes and the sleep efficiency was 67.3%. The total sleep time was 237.0 minutes. Stage REM latency was 111.0 minutes. The patient spent 5.06% of the night in stage N1 sleep, 60.55% in stage N2 sleep, 0.00% in stage N3 and 34.39% in REM. Alpha intrusion was absent. Supine sleep was 5.91%.  RESPIRATORY PARAMETERS The overall apnea/hypopnea index (AHI) was 13.4 per hour. There were 46 total apneas, including 30 obstructive, 1 central and 15 mixed apneas. There were 7 hypopneas and 3 RERAs. The AHI during Stage REM sleep was 14.0 per hour. AHI while supine was 124.3 per hour. The mean oxygen saturation was 92.61%. The minimum SpO2 during sleep was 85.00%. Loud snoring was noted during this study.  CARDIAC DATA The 2 lead EKG demonstrated sinus rhythm. The mean heart rate was 77.57 beats per minute.  Other EKG findings include: PVCs.  LEG MOVEMENT DATA The total PLMS were 0 with a resulting PLMS index of 0.00. Associated arousal with leg movement index was 0.0 .  IMPRESSIONS - Mild obstructive sleep apnea occurred during this study (AHI = 13.4/h). - No significant central sleep apnea occurred during this study (CAI = 0.3/h). - Mild oxygen desaturation was noted during this study (Min O2 = 85.00%). - The patient snored with Loud snoring volume. - EKG findings include PVCs. - Clinically significant periodic limb movements did not occur during sleep. No significant associated arousals.  DIAGNOSIS - Obstructive Sleep Apnea (327.23 [G47.33 ICD-10])  RECOMMENDATIONS - Therapeutic CPAP titration to determine optimal pressure required to alleviate sleep disordered breathing. - Positional therapy avoiding supine position during sleep. - Avoid alcohol, sedatives and other CNS depressants that may worsen sleep apnea and disrupt normal sleep architecture. - Sleep hygiene should be reviewed to assess factors that may improve sleep quality. - Weight management and regular exercise should be initiated or continued if appropriate.  [Electronically signed] 03/13/2016 09:28 AM  Baird Lyons MD, ABSM Diplomate, American Board of Sleep Medicine   NPI: [id number]  [place], American Board of Sleep Medicine  ELECTRONICALLY SIGNED ON:  03/13/2016, 9:27 AM [place] PH: [phone]   FX: [phone] [place]

## 2016-03-15 ENCOUNTER — Telehealth: Payer: Self-pay | Admitting: *Deleted

## 2016-03-15 ENCOUNTER — Encounter: Payer: Self-pay | Admitting: Internal Medicine

## 2016-03-15 DIAGNOSIS — G4733 Obstructive sleep apnea (adult) (pediatric): Secondary | ICD-10-CM | POA: Insufficient documentation

## 2016-03-15 NOTE — Progress Notes (Signed)
[name] has mild sleep apnea.  It is recommended that he have another study to determine the proper pressure for him to use CPAP( I believe we'll need to put that order in also).  Other recommendations included:  Not sleeping flat, avoiding alcohol, sedatives that may worsen this problem and of course, regular exercise and weight loss.

## 2016-03-15 NOTE — Telephone Encounter (Signed)
.  left message to have patient return my call.  

## 2016-03-15 NOTE — Telephone Encounter (Signed)
Progress Notes   Gayland Curry, DO at 03/03/2016 8:00 PM   Status: Signed    [name] has mild sleep apnea.  It is recommended that he have another study to determine the proper pressure for him to use CPAP( I believe we'll need to put that order in also).  Other recommendations included:  Not sleeping flat, avoiding alcohol, sedatives that may worsen this problem and of course, regular exercise and weight loss.

## 2016-03-17 NOTE — Telephone Encounter (Signed)
Patient returned call. Patient will further discuss at pending appointment on 05/07/16. If patient wishes to consider additional testing prior to appointment he will call

## 2016-03-22 ENCOUNTER — Telehealth: Payer: Self-pay | Admitting: *Deleted

## 2016-03-22 DIAGNOSIS — E6609 Other obesity due to excess calories: Secondary | ICD-10-CM

## 2016-03-22 DIAGNOSIS — G4733 Obstructive sleep apnea (adult) (pediatric): Secondary | ICD-10-CM

## 2016-03-22 NOTE — Telephone Encounter (Signed)
Patient called and and stated that he wanted to go ahead with the additional test for sleep disorder.  Reviewed Dr. Cyndi Lennert notes. Order placed for Therapeutic CPAP titration

## 2016-04-03 ENCOUNTER — Other Ambulatory Visit: Payer: Self-pay | Admitting: Internal Medicine

## 2016-04-16 DIAGNOSIS — H52203 Unspecified astigmatism, bilateral: Secondary | ICD-10-CM

## 2016-04-16 DIAGNOSIS — H35361 Drusen (degenerative) of macula, right eye: Secondary | ICD-10-CM | POA: Insufficient documentation

## 2016-04-16 DIAGNOSIS — H47093 Other disorders of optic nerve, not elsewhere classified, bilateral: Secondary | ICD-10-CM | POA: Insufficient documentation

## 2016-04-16 DIAGNOSIS — H524 Presbyopia: Secondary | ICD-10-CM

## 2016-04-16 DIAGNOSIS — H2513 Age-related nuclear cataract, bilateral: Secondary | ICD-10-CM | POA: Insufficient documentation

## 2016-04-16 DIAGNOSIS — H5213 Myopia, bilateral: Secondary | ICD-10-CM | POA: Insufficient documentation

## 2016-04-16 LAB — HM DIABETES EYE EXAM

## 2016-04-18 ENCOUNTER — Other Ambulatory Visit: Payer: Self-pay | Admitting: Internal Medicine

## 2016-04-22 ENCOUNTER — Encounter: Payer: Self-pay | Admitting: Internal Medicine

## 2016-05-04 ENCOUNTER — Other Ambulatory Visit: Payer: PRIVATE HEALTH INSURANCE

## 2016-05-04 DIAGNOSIS — Z0000 Encounter for general adult medical examination without abnormal findings: Secondary | ICD-10-CM

## 2016-05-04 DIAGNOSIS — E669 Obesity, unspecified: Secondary | ICD-10-CM

## 2016-05-04 DIAGNOSIS — I10 Essential (primary) hypertension: Secondary | ICD-10-CM

## 2016-05-04 DIAGNOSIS — E119 Type 2 diabetes mellitus without complications: Secondary | ICD-10-CM

## 2016-05-04 LAB — BASIC METABOLIC PANEL
BUN: 10 mg/dL (ref 7–25)
CO2: 29 mmol/L (ref 20–31)
Calcium: 8.9 mg/dL (ref 8.6–10.3)
Chloride: 104 mmol/L (ref 98–110)
Creat: 0.92 mg/dL (ref 0.70–1.25)
Glucose, Bld: 141 mg/dL — ABNORMAL HIGH (ref 65–99)
Potassium: 4.3 mmol/L (ref 3.5–5.3)
Sodium: 140 mmol/L (ref 135–146)

## 2016-05-04 LAB — HEMOGLOBIN A1C
Hgb A1c MFr Bld: 6.7 % — ABNORMAL HIGH (ref <5.7)
Mean Plasma Glucose: 146 mg/dL

## 2016-05-07 ENCOUNTER — Ambulatory Visit (INDEPENDENT_AMBULATORY_CARE_PROVIDER_SITE_OTHER): Payer: PRIVATE HEALTH INSURANCE | Admitting: Internal Medicine

## 2016-05-07 ENCOUNTER — Encounter: Payer: Self-pay | Admitting: Internal Medicine

## 2016-05-07 VITALS — BP 138/80 | HR 98 | Temp 98.9°F | Ht 69.0 in | Wt 224.0 lb

## 2016-05-07 DIAGNOSIS — Z2911 Encounter for prophylactic immunotherapy for respiratory syncytial virus (RSV): Secondary | ICD-10-CM

## 2016-05-07 DIAGNOSIS — S83241D Other tear of medial meniscus, current injury, right knee, subsequent encounter: Secondary | ICD-10-CM

## 2016-05-07 DIAGNOSIS — E119 Type 2 diabetes mellitus without complications: Secondary | ICD-10-CM

## 2016-05-07 DIAGNOSIS — H25013 Cortical age-related cataract, bilateral: Secondary | ICD-10-CM

## 2016-05-07 DIAGNOSIS — I10 Essential (primary) hypertension: Secondary | ICD-10-CM | POA: Diagnosis not present

## 2016-05-07 DIAGNOSIS — E6609 Other obesity due to excess calories: Secondary | ICD-10-CM | POA: Diagnosis not present

## 2016-05-07 DIAGNOSIS — Z23 Encounter for immunization: Secondary | ICD-10-CM

## 2016-05-07 NOTE — Addendum Note (Signed)
Addended by: Despina Hidden on: 05/07/2016 11:08 AM   Modules accepted: Orders

## 2016-05-07 NOTE — Patient Instructions (Signed)
Check your bp 2-3x per week about 1 hour after your medications.  If your readings are consistently over 130/80 by early January, please call me or put reading in [place] and we will make some changes.

## 2016-05-07 NOTE — Progress Notes (Signed)
Location:  [facility] clinic Provider:  Chaddrick Brue L. Mariea Clonts, D.O., C.M.D.  Code Status: DNR Goals of Care:  Advanced Directives 05/07/2016  Does patient have an advance directive? No  Copy of advanced directive(s) in chart? -  Would patient like information on creating an advanced directive? Yes - Geneticist, molecular Complaint  Patient presents with  . Medical Management of Chronic Issues    4 mth follow-up    HPI: Patient is a 60 y.o. male seen today for medical management of chronic diseases.    No new things.  Knee is status quo.   No plans for repair--cannot be done b/c of the way it is torn--if removed, he'd have bone on bone--seen in May.   If it gets worse, he will recheck the MRI.  He had a steroid injection in May for discomfort.  He cannot walk very well if there are dips in the lawn for example due to pain.  Continues to wear a brace during the day.  If he moves around a lot, he might take two advils occasionally, but not consistently.    Reviewed his ophtho visit.  Driving at night is a pain.  Does not notice rings around lights though has cataracts.  Had a little bit of macular drusen.  Atrophy of papillae.  Nearsighted with astigmatism.    Hypertension:  He will check his bps at home today--goal is less than 130/80 and just a little high today.    He is due to see dermatology for 6 mo f/u about his arms.  Past Medical History:  Diagnosis Date  . Diabetes mellitus without complication (HCC)    Type 2  . H/O lymph node excision [place] , [place]  . Hypertension   . Viral pneumonia 1965    Past Surgical History:  Procedure Laterality Date  . SKIN BIOPSY Right 05/21/15   forearm Robyne Askew PA    No Known Allergies    Medication List       Accurate as of 05/07/16  8:26 AM. Always use your most recent med list.          aspirin 81 MG tablet Take 81 mg by mouth daily.   atorvastatin 40 MG tablet Commonly known as:  LIPITOR TAKE ONE  TABLET BY MOUTH ONCE DAILY   benazepril-hydrochlorthiazide 10-12.5 MG tablet Commonly known as:  LOTENSIN HCT TAKE ONE TABLET BY MOUTH ONCE DAILY   cetirizine 10 MG tablet Commonly known as:  ZYRTEC Take 10 mg by mouth daily.   fluticasone 50 MCG/ACT nasal spray Commonly known as:  FLONASE USE TWO SPRAY(S) IN EACH NOSTRIL ONCE DAILY   metFORMIN 1000 MG tablet Commonly known as:  GLUCOPHAGE TAKE ONE TABLET BY MOUTH TWICE DAILY   PROCTOSOL HC 2.5 % rectal cream Generic drug:  hydrocortisone as needed.   psyllium 0.52 g capsule Commonly known as:  REGULOID Take 0.52 g by mouth daily.   TRADJENTA 5 MG Tabs tablet Generic drug:  linagliptin TAKE ONE TABLET BY MOUTH ONCE DAILY   TRULICITY [id number] Sopn Generic drug:  Dulaglutide INJECT .75MG  SUBCUTANEOUSLY ONCE A WEEK       Review of Systems:  Review of Systems  Constitutional: Negative for chills, fever, malaise/fatigue and weight loss.  HENT: Negative for congestion and hearing loss.   Eyes: Negative for blurred vision.       Cataracts  Respiratory: Negative for cough and shortness of breath.   Cardiovascular: Negative for chest  pain, palpitations and leg swelling.  Gastrointestinal: Negative for abdominal pain, blood in stool, constipation and melena.  Genitourinary: Negative for dysuria, frequency and urgency.  Musculoskeletal: Positive for joint pain. Negative for falls and myalgias.       Right knee--see hpi  Skin: Negative for itching and rash.  Neurological: Negative for dizziness, tingling, sensory change, loss of consciousness and weakness.  Psychiatric/Behavioral: Negative for depression and memory loss. The patient is not nervous/anxious and does not have insomnia.     Health Maintenance  Topic Date Due  . PNEUMOCOCCAL POLYSACCHARIDE VACCINE (2) 03/22/2011  . ZOSTAVAX  12/23/2015  . INFLUENZA VACCINE  01/20/2016  . HEMOGLOBIN A1C  11/01/2016  . FOOT EXAM  01/11/2017  . TETANUS/TDAP  01/19/2017    . OPHTHALMOLOGY EXAM  04/16/2017  . COLONOSCOPY  02/22/2024  . Hepatitis C Screening  Completed  . HIV Screening  Addressed    Physical Exam: Vitals:   05/07/16 0820  BP: 138/80  Pulse: 98  Temp: 98.9 F (37.2 C)  TempSrc: Oral  SpO2: 98%  Weight: 224 lb (101.6 kg)  Height: 5\' 9"  (1.753 m)   Body mass index is 33.08 kg/m. Physical Exam  Constitutional: He is oriented to person, place, and time. He appears well-developed and well-nourished. No distress.  Cardiovascular: Normal rate, regular rhythm, normal heart sounds and intact distal pulses.   Pulmonary/Chest: Effort normal and breath sounds normal. No respiratory distress.  Abdominal: Soft. Bowel sounds are normal.  Musculoskeletal: Normal range of motion.  Neurological: He is alert and oriented to person, place, and time.  Skin: Skin is warm and dry.  Psychiatric: He has a normal mood and affect.    Labs reviewed: Basic Metabolic Panel:  Recent Labs  06/09/15 1149 09/22/15 1141 05/04/16 1026  NA 143 141 140  K 3.7 4.3 4.3  CL 101 100 104  CO2 26 23 29   GLUCOSE 115* 104* 141*  BUN 11 10 10   CREATININE 0.91 0.89 0.92  CALCIUM 9.1 8.9 8.9   Liver Function Tests: No results for input(s): AST, ALT, ALKPHOS, BILITOT, PROT, ALBUMIN in the last 8760 hours. No results for input(s): LIPASE, AMYLASE in the last 8760 hours. No results for input(s): AMMONIA in the last 8760 hours. CBC: No results for input(s): WBC, NEUTROABS, HGB, HCT, MCV, PLT in the last 8760 hours. Lipid Panel:  Recent Labs  01/09/16 0840  CHOL 94*  HDL 43  LDLCALC 34  TRIG 83  CHOLHDL 2.2   Lab Results  Component Value Date   HGBA1C 6.7 (H) 05/04/2016    Assessment/Plan 1. Controlled type 2 diabetes mellitus without complication, without long-term current use of insulin (HCC) - control improved, cont same regimen  - Lipid panel; Future - Hemoglobin A1c; Future  2. Obesity due to excess calories without serious comorbidity,  unspecified classification -cont to work on diet and exercise as best he can with his meniscal injury - cont lipitor -Lipid panel; Future  3. Tear of medial meniscus of right knee, subsequent encounter -not a surgical candidate as he would end up w/o cartilage at all and bone on bone OA situation -cont brace use and topicals, advil on bad days only, not regularly  4. Essential hypertension, benign - bp could be a little better--goal <130/80 -he's going to check his bps at home and send me results in early Jan if trending up over this goal - COMPLETE METABOLIC PANEL WITH GFR; Future  5. Cortical age-related cataract of both eyes -not yet  in need of extraction, no new symptoms   6. Need for Zostavax administration -given today   Labs/tests ordered:   Orders Placed This Encounter  Procedures  . Lipid panel    Standing Status:   Future    Standing Expiration Date:   01/04/2017    Order Specific Question:   Has the patient fasted?    Answer:   Yes  . Hemoglobin A1c    Standing Status:   Future    Standing Expiration Date:   01/04/2017  . COMPLETE METABOLIC PANEL WITH GFR    [facility]    Standing Status:   Future    Standing Expiration Date:   01/04/2017    Next appt:  4 mos med mgt, labs before   Rossie Scarfone L. Veneta Sliter, D.O. [facility] [address], [place] [address] Cell Phone (Mon-Fri 8am-5pm):  [phone] On Call:  [phone] & follow prompts after 5pm & weekends Office Phone:  [phone] Office Fax:  [phone]

## 2016-05-19 ENCOUNTER — Encounter (HOSPITAL_BASED_OUTPATIENT_CLINIC_OR_DEPARTMENT_OTHER): Payer: PRIVATE HEALTH INSURANCE

## 2016-06-03 ENCOUNTER — Other Ambulatory Visit: Payer: Self-pay | Admitting: Internal Medicine

## 2016-06-26 ENCOUNTER — Other Ambulatory Visit: Payer: Self-pay | Admitting: Internal Medicine

## 2016-07-02 ENCOUNTER — Ambulatory Visit (HOSPITAL_BASED_OUTPATIENT_CLINIC_OR_DEPARTMENT_OTHER): Payer: PRIVATE HEALTH INSURANCE | Attending: Internal Medicine | Admitting: Internal Medicine

## 2016-07-02 VITALS — Ht 69.0 in | Wt 220.0 lb

## 2016-07-02 DIAGNOSIS — G4733 Obstructive sleep apnea (adult) (pediatric): Secondary | ICD-10-CM | POA: Insufficient documentation

## 2016-07-02 DIAGNOSIS — E6609 Other obesity due to excess calories: Secondary | ICD-10-CM

## 2016-07-02 DIAGNOSIS — Z6832 Body mass index (BMI) 32.0-32.9, adult: Secondary | ICD-10-CM | POA: Insufficient documentation

## 2016-07-03 ENCOUNTER — Other Ambulatory Visit: Payer: Self-pay | Admitting: Internal Medicine

## 2016-07-05 ENCOUNTER — Other Ambulatory Visit: Payer: Self-pay | Admitting: *Deleted

## 2016-07-05 MED ORDER — METFORMIN HCL 1000 MG PO TABS: 1000.0000 mg | ORAL_TABLET | Freq: Two times a day (BID) | ORAL | 5 refills | Status: DC

## 2016-07-05 NOTE — Telephone Encounter (Signed)
[facility] 

## 2016-07-08 ENCOUNTER — Other Ambulatory Visit: Payer: Self-pay | Admitting: *Deleted

## 2016-07-08 MED ORDER — METFORMIN HCL 1000 MG PO TABS: 1000.0000 mg | ORAL_TABLET | Freq: Two times a day (BID) | ORAL | 5 refills | Status: DC

## 2016-07-08 NOTE — Telephone Encounter (Signed)
[facility] [place]

## 2016-07-10 DIAGNOSIS — G4733 Obstructive sleep apnea (adult) (pediatric): Secondary | ICD-10-CM

## 2016-07-10 NOTE — Procedures (Signed)
  Patient Name: Aeon, Caulley Date: 07/02/2016 Gender: Male D.O.B: [birth year] Age (years): 10 Referring Provider: Gayland Curry DO Height (inches): 69 Interpreting Physician: Baird Lyons MD, ABSM Weight (lbs): 220 RPSGT: Madelon Lips BMI: 32 MRN: [id number] Neck Size: 17.00 CLINICAL INFORMATION The patient is referred for a CPAP titration to treat sleep apnea.    Date of NPSG, Split Night or HST:  Diagnostic NPSG 03/03/16   AHI 13.4/ hr, desaturation to 85%, body weight 220 lbs  SLEEP STUDY TECHNIQUE As per the AASM Manual for the Scoring of Sleep and Associated Events v2.3 (April 2016) with a hypopnea requiring 4% desaturations.  The channels recorded and monitored were frontal, central and occipital EEG, electrooculogram (EOG), submentalis EMG (chin), nasal and oral airflow, thoracic and abdominal wall motion, anterior tibialis EMG, snore microphone, electrocardiogram, and pulse oximetry. Continuous positive airway pressure (CPAP) was initiated at the beginning of the study and titrated to treat sleep-disordered breathing.  MEDICATIONS Medications self-administered by patient taken the night of the study : None reported  TECHNICIAN COMMENTS Comments added by technician: BATHROOM BREAKS X1. PATIENT PRESENTED SOME EVENTS THAT DID NOT MEET FULL SCORING CRITERIA.  Comments added by scorer: N/A  RESPIRATORY PARAMETERS Optimal PAP Pressure (cm): 14 AHI at Optimal Pressure (/hr): 2.9 Overall Minimal O2 (%): 87.00 Supine % at Optimal Pressure (%): 100 Minimal O2 at Optimal Pressure (%): 92.0    SLEEP ARCHITECTURE The study was initiated at 10:57:44 PM and ended at 5:18:41 AM.  Sleep onset time was 29.8 minutes and the sleep efficiency was 72.2%. The total sleep time was 275.0 minutes.  The patient spent 6.55% of the night in stage N1 sleep, 67.09% in stage N2 sleep, 0.00% in stage N3 and 26.36% in REM.Stage REM latency was 66.5 minutes  Wake after sleep onset was 76.2.  Alpha intrusion was absent. Supine sleep was 27.09%.  CARDIAC DATA The 2 lead EKG demonstrated sinus rhythm. The mean heart rate was 72.98 beats per minute. Other EKG findings include: PVCs.  LEG MOVEMENT DATA The total Periodic Limb Movements of Sleep (PLMS) were 0. The PLMS index was 0.00. A PLMS index of <15 is considered normal in adults.  IMPRESSIONS - The optimal PAP pressure was 14 cm of water. - Central sleep apnea was not noted during this titration (CAI = 0.4/h). - Mild oxygen desaturations were observed during this titration (min O2 = 87.00%). - The patient snored with Moderate snoring volume during this titration study. - 2-lead EKG demonstrated: PVCs - Clinically significant periodic limb movements were not noted during this study. Arousals associated with PLMs were rare.  DIAGNOSIS - Obstructive Sleep Apnea (327.23 [G47.33 ICD-10])  RECOMMENDATIONS - Trial of CPAP therapy on 14 cm H2O with a Medium size Fisher&Paykel Full Face Mask Simplus mask and heated humidification. - Avoid alcohol, sedatives and other CNS depressants that may worsen sleep apnea and disrupt normal sleep architecture. - Sleep hygiene should be reviewed to assess factors that may improve sleep quality. - Weight management and regular exercise should be initiated or continued.  [Electronically signed] 07/10/2016 12:30 PM  Baird Lyons MD, [place], American Board of Sleep Medicine   NPI: [id number]  [place], American Board of Sleep Medicine  ELECTRONICALLY SIGNED ON:  07/10/2016, 12:28 PM [place] PH: [phone]   FX: [phone] [place]

## 2016-07-17 ENCOUNTER — Other Ambulatory Visit: Payer: Self-pay | Admitting: Internal Medicine

## 2016-07-19 ENCOUNTER — Other Ambulatory Visit: Payer: Self-pay | Admitting: *Deleted

## 2016-07-19 MED ORDER — LINAGLIPTIN 5 MG PO TABS: 5.0000 mg | ORAL_TABLET | Freq: Every day | ORAL | 6 refills | Status: DC

## 2016-07-19 NOTE — Telephone Encounter (Signed)
[facility] [place] 

## 2016-09-08 ENCOUNTER — Other Ambulatory Visit: Payer: PRIVATE HEALTH INSURANCE

## 2016-09-08 DIAGNOSIS — E119 Type 2 diabetes mellitus without complications: Secondary | ICD-10-CM

## 2016-09-08 DIAGNOSIS — I10 Essential (primary) hypertension: Secondary | ICD-10-CM

## 2016-09-08 DIAGNOSIS — E6609 Other obesity due to excess calories: Secondary | ICD-10-CM

## 2016-09-08 LAB — LIPID PANEL
Cholesterol: 87 mg/dL (ref <200)
HDL: 40 mg/dL — ABNORMAL LOW (ref >40)
LDL Cholesterol: 26 mg/dL (ref <100)
Total CHOL/HDL Ratio: 2.2 Ratio (ref <5.0)
Triglycerides: 103 mg/dL (ref <150)
VLDL: 21 mg/dL (ref <30)

## 2016-09-08 LAB — COMPLETE METABOLIC PANEL WITH GFR
ALT: 18 U/L (ref 9–46)
AST: 18 U/L (ref 10–35)
Albumin: 4.1 g/dL (ref 3.6–5.1)
Alkaline Phosphatase: 57 U/L (ref 40–115)
BUN: 11 mg/dL (ref 7–25)
CO2: 25 mmol/L (ref 20–31)
Calcium: 8.7 mg/dL (ref 8.6–10.3)
Chloride: 107 mmol/L (ref 98–110)
Creat: 0.86 mg/dL (ref 0.70–1.25)
GFR, Est African American: >89 mL/min (ref >=60)
GFR, Est Non African American: >89 mL/min (ref >=60)
Glucose, Bld: 155 mg/dL — ABNORMAL HIGH (ref 65–99)
Potassium: 3.9 mmol/L (ref 3.5–5.3)
Sodium: 140 mmol/L (ref 135–146)
Total Bilirubin: 1.2 mg/dL (ref 0.2–1.2)
Total Protein: 6.1 g/dL (ref 6.1–8.1)

## 2016-09-09 LAB — HEMOGLOBIN A1C
Hgb A1c MFr Bld: 6.6 % — ABNORMAL HIGH (ref <5.7)
Mean Plasma Glucose: 143 mg/dL

## 2016-09-10 ENCOUNTER — Telehealth: Payer: Self-pay

## 2016-09-10 ENCOUNTER — Encounter: Payer: Self-pay | Admitting: Internal Medicine

## 2016-09-10 ENCOUNTER — Ambulatory Visit (INDEPENDENT_AMBULATORY_CARE_PROVIDER_SITE_OTHER): Payer: PRIVATE HEALTH INSURANCE | Admitting: Internal Medicine

## 2016-09-10 VITALS — BP 122/78 | HR 87 | Temp 98.7°F | Resp 12 | Ht 69.0 in | Wt 218.0 lb

## 2016-09-10 DIAGNOSIS — G4733 Obstructive sleep apnea (adult) (pediatric): Secondary | ICD-10-CM

## 2016-09-10 DIAGNOSIS — J309 Allergic rhinitis, unspecified: Secondary | ICD-10-CM | POA: Diagnosis not present

## 2016-09-10 DIAGNOSIS — Z6832 Body mass index (BMI) 32.0-32.9, adult: Secondary | ICD-10-CM

## 2016-09-10 DIAGNOSIS — E669 Obesity, unspecified: Secondary | ICD-10-CM | POA: Diagnosis not present

## 2016-09-10 DIAGNOSIS — E1169 Type 2 diabetes mellitus with other specified complication: Secondary | ICD-10-CM | POA: Diagnosis not present

## 2016-09-10 DIAGNOSIS — I10 Essential (primary) hypertension: Secondary | ICD-10-CM

## 2016-09-10 DIAGNOSIS — E119 Type 2 diabetes mellitus without complications: Secondary | ICD-10-CM | POA: Diagnosis not present

## 2016-09-10 DIAGNOSIS — E785 Hyperlipidemia, unspecified: Secondary | ICD-10-CM | POA: Diagnosis not present

## 2016-09-10 DIAGNOSIS — Z23 Encounter for immunization: Secondary | ICD-10-CM | POA: Diagnosis not present

## 2016-09-10 MED ORDER — ATORVASTATIN CALCIUM 20 MG PO TABS: 20.0000 mg | ORAL_TABLET | Freq: Every day | ORAL | 3 refills | Status: DC

## 2016-09-10 MED ORDER — BENAZEPRIL-HYDROCHLOROTHIAZIDE 20-12.5 MG PO TABS: 1.0000 | ORAL_TABLET | Freq: Every day | ORAL | 3 refills | Status: DC

## 2016-09-10 NOTE — Telephone Encounter (Signed)
Per Dr.Reed- Call pulmonary and get a status update on sleep study. Patient was provided a CPAP mask and no other equipment.  I called Dr.Young's office to inquire about process to get patient a CPAP machine, per [facility] patient will need to establish with one of the pulmonary doctors.  Referral order pending, please advise

## 2016-09-10 NOTE — Progress Notes (Signed)
Location:  [facility] clinic Provider:  Pressley Barsky L. Mariea Clonts, D.O., C.M.D.  Code Status: full code at this point--need copies of living will/hcpoa also Goals of Care:  Advanced Directives 09/10/2016  Does [name] Have a Medical Advance Directive? No  Copy of Healthcare Power of Attorney in Chart? -  Would [name] like information on creating a medical advance directive? (No Data)   Chief Complaint  [name] presents with  . Medical Management of Chronic Issues    4 month follow-up ( discuss labs/copy printed)   . Medication Refill    No refills needed at the [facility] appointment   . Immunizations    Discuss pneu23    HPI: [name] is a 61 y.o. male seen today for medical management of chronic diseases.    BPs are 130s when he's at work.  Weekends are better in the 120s. Diastolic generally in the 80s except one down in 70s on a Sunday.    He's down 1.2 lbs since last visit.  He walks the dog in the mornings.  He is eating a regular healthy meal routine.    Cholesterol: doing well with diet and exercise so agrees to try lower dose.    hba1c is great at 6.6.  Has been trending down with trulicity, metformin and tradjenta.  Also on baby asa and an ace as well as statin.    Had a sinus infection 12/22 and went to [place] care and was put on augmentin high dose.  It did help.  Also had a 5 day or so dose pack of prednisone.  BP was high there that day.  Takes the mucinex plain generic er.  Urgent care had advised to stop the zyrtec that he was taking regularly.  ENT had recommended the zyrtec regularly back several years ago due to recurrent sinus functions.    Had his latest derm appt 3/1.  Had a few spots frozen on his face.  She recommended the blue light treatment (PDT) for him in the fall (oct/nov) as preventive.  She's concerned they are precancerous and he had the one malignant area on the right forearm before.    He got an alert from his insurance company--cscope not due until  2020 due to benign polyps.    Does not need urine microalbumin done due to his ace inhibitor already on board.    PSA screening is no longer recommended by guidelines when asymptomatic.    1/12 had his cpap titration for OSA.  He got the mask but no machine to use.    Past Medical History:  Diagnosis Date  . Diabetes mellitus without complication (HCC)    Type 2  . H/O lymph node excision [place] , [place]  . Hypertension   . Viral pneumonia 1965    Past Surgical History:  Procedure Laterality Date  . SKIN BIOPSY Right 05/21/15   forearm Robyne Askew PA    No Known Allergies  Allergies as of 09/10/2016   No Known Allergies     Medication List       Accurate as of 09/10/16  8:30 AM. Always use your most recent med list.          aspirin 81 MG tablet Take 81 mg by mouth daily.   atorvastatin 40 MG tablet Commonly known as:  LIPITOR TAKE ONE TABLET BY MOUTH ONCE DAILY   benazepril-hydrochlorthiazide 10-12.5 MG tablet Commonly known as:  LOTENSIN HCT TAKE ONE TABLET BY MOUTH ONCE DAILY  cetirizine 10 MG tablet Commonly known as:  ZYRTEC Take 10 mg by mouth as needed.   fluticasone 50 MCG/ACT nasal spray Commonly known as:  FLONASE USE TWO SPRAY(S) IN EACH NOSTRIL ONCE DAILY   linagliptin 5 MG Tabs tablet Commonly known as:  TRADJENTA Take 1 tablet (5 mg total) by mouth daily.   metFORMIN 1000 MG tablet Commonly known as:  GLUCOPHAGE Take 1 tablet (1,000 mg total) by mouth 2 (two) times daily.   MUCINEX 600 MG 12 hr tablet Generic drug:  guaiFENesin Take 600 mg by mouth 2 (two) times daily.   psyllium 0.52 g capsule Commonly known as:  REGULOID Take 0.52 g by mouth daily.   TRULICITY 7.01 XB/9.3JQ Sopn Generic drug:  Dulaglutide INJECT .75MG  SUBCUTANEOUSLY ONCE A WEEK       Review of Systems:  Review of Systems  Constitutional: Negative for chills and fever.  HENT: Negative for congestion and hearing loss.        Sinus infection  cleared up  Eyes: Negative for blurred vision.       Glasses  Respiratory: Negative for cough and shortness of breath.        Sleep apnea  Cardiovascular: Negative for chest pain and palpitations.  Gastrointestinal: Negative for abdominal pain, blood in stool, constipation and melena.  Genitourinary: Negative for dysuria.  Musculoskeletal: Negative for falls and myalgias.  Neurological: Negative for dizziness and loss of consciousness.  Endo/Heme/Allergies:       Diabetes  Psychiatric/Behavioral: Negative for memory loss.    Health Maintenance  Topic Date Due  . PNEUMOCOCCAL POLYSACCHARIDE VACCINE (2) 03/22/2011  . TETANUS/TDAP  01/19/2017  . HEMOGLOBIN A1C  03/11/2017  . OPHTHALMOLOGY EXAM  04/16/2017  . FOOT EXAM  05/07/2017  . COLONOSCOPY  02/22/2024  . INFLUENZA VACCINE  Completed  . Hepatitis C Screening  Completed  . HIV Screening  Addressed    Physical Exam: Vitals:   09/10/16 0821  BP: 122/78  Pulse: 87  Resp: 12  Temp: 98.7 F (37.1 C)  TempSrc: Oral  SpO2: 96%  Weight: 218 lb (98.9 kg)  Height: 5\' 9"  (1.753 m)   Body mass index is 32.19 kg/m. Physical Exam  Constitutional: He is oriented to person, place, and time. He appears well-developed and well-nourished. No distress.  Eyes:  glasses  Cardiovascular: Normal rate, regular rhythm, normal heart sounds and intact distal pulses.   Pulmonary/Chest: Effort normal and breath sounds normal. No respiratory distress.  Abdominal: Soft. Bowel sounds are normal.  Musculoskeletal: Normal range of motion.  Neurological: He is alert and oriented to person, place, and time.  Skin: Skin is warm and dry. Capillary refill takes less than 2 seconds.  Psychiatric: He has a normal mood and affect.    Labs reviewed: Basic Metabolic Panel:  Recent Labs  09/22/15 1141 05/04/16 1026 09/08/16 0804  NA 141 140 140  K 4.3 4.3 3.9  CL 100 104 107  CO2 23 29 25   GLUCOSE 104* 141* 155*  BUN 10 10 11   CREATININE 0.89  0.92 0.86  CALCIUM 8.9 8.9 8.7   Liver Function Tests:  Recent Labs  09/08/16 0804  AST 18  ALT 18  ALKPHOS 57  BILITOT 1.2  PROT 6.1  ALBUMIN 4.1   No results for input(s): LIPASE, AMYLASE in the last 8760 hours. No results for input(s): AMMONIA in the last 8760 hours. CBC: No results for input(s): WBC, NEUTROABS, HGB, HCT, MCV, PLT in the last 8760 hours. Lipid Panel:  Recent Labs  01/09/16 0840 09/08/16 0804  CHOL 94* 87  HDL 43 40*  LDLCALC 34 26  TRIG 83 103  CHOLHDL 2.2 2.2   Lab Results  Component Value Date   HGBA1C 6.6 (H) 09/08/2016    Assessment/Plan 1. Essential hypertension -most bps at goal of <130/80, but at work they are elevated so will increase lotensin hct from 10/12.5mg  to 20/12.5mg  -no edema so no need to increase the hct portion - benazepril-hydrochlorthiazide (LOTENSIN HCT) 20-12.5 MG tablet; Take 1 tablet by mouth daily.  Dispense: 90 tablet; Refill: 3  2. Hyperlipidemia associated with type 2 diabetes mellitus ([place]) - lipids have always been quite good over the past 1.5 yrs at least - will reduce lipitor dose to decrease amt of meds in his system and see if LDL remains less than 70  - atorvastatin (LIPITOR) 20 MG tablet; Take 1 tablet (20 mg total) by mouth daily at 6 PM.  Dispense: 30 tablet; Refill: 3  3. Mild obstructive sleep apnea -unclear why he only was given a mask and tubing from pulmonary, but was not set up with other equipment for CPAP   4. Controlled type 2 diabetes mellitus without complication, without long-term current use of insulin (HCC) -cont current regimen as in hpi  5. Class 1 obesity with serious comorbidity and body mass index (BMI) of 32.0 to 32.9 in adult, unspecified obesity type -continue to work on walking and diet, same diabetes regimen which has been working  6. Allergic sinusitis -cont mucinex, only restart zyrtec if rhinitis and congestion, watery eyes, etc. resume this season  7.  Need for 23-valent  pneumonia vaccine:  given  Labs/tests ordered:   Orders Placed This Encounter  Procedures  . CBC with Differential/Platelet    Standing Status:   Future    Standing Expiration Date:   05/13/2017  . COMPLETE METABOLIC PANEL WITH GFR    [facility]    Standing Status:   Future    Standing Expiration Date:   05/13/2017  . Hemoglobin A1c    Standing Status:   Future    Standing Expiration Date:   05/13/2017  . Lipid panel    Standing Status:   Future    Standing Expiration Date:   05/13/2017    Order Specific Question:   Has the [name] fasted?    Answer:   Yes    Next appt:  4 mos annual labs before  [place]. Nansi Birmingham, D.O. [facility] [address], [place] [address] Cell Phone (Mon-Fri 8am-5pm):  [phone] On Call:  [phone] & follow prompts after 5pm & weekends Office Phone:  [phone] Office Fax:  [phone]

## 2016-09-10 NOTE — Patient Instructions (Signed)
Use up your current blood pressure medication, then begin the new dose of lotensin/hctz 20/12.5mg  daily.  If you can cut your lipitor (atoravastatin) in half, you may until you use up what you have, then reduce to 20mg  daily, preferably with evening meal or bedtime if you can remember it then.

## 2016-10-02 ENCOUNTER — Other Ambulatory Visit: Payer: Self-pay | Admitting: Internal Medicine

## 2016-10-29 ENCOUNTER — Encounter: Payer: Self-pay | Admitting: Pulmonary Disease

## 2016-10-29 ENCOUNTER — Ambulatory Visit (INDEPENDENT_AMBULATORY_CARE_PROVIDER_SITE_OTHER): Payer: PRIVATE HEALTH INSURANCE | Admitting: Pulmonary Disease

## 2016-10-29 VITALS — BP 124/78 | HR 91 | Resp 16 | Ht 70.0 in | Wt 219.0 lb

## 2016-10-29 DIAGNOSIS — F5112 Insufficient sleep syndrome: Secondary | ICD-10-CM | POA: Diagnosis not present

## 2016-10-29 DIAGNOSIS — G4733 Obstructive sleep apnea (adult) (pediatric): Secondary | ICD-10-CM

## 2016-10-29 DIAGNOSIS — Z7189 Other specified counseling: Secondary | ICD-10-CM | POA: Diagnosis not present

## 2016-10-29 NOTE — Progress Notes (Signed)
Past Surgical History He  has a past surgical history that includes Skin biopsy (Right, 05/21/15).  No Known Allergies  Family History His family history includes Diabetes (age of onset: 55) in his mother; Heart disease (age of onset: 48) in his father; Hypertension in his mother.  Social History He  reports that he has never smoked. He has never used smokeless tobacco. He reports that he drinks about 0.6 oz of alcohol per week . He reports that he does not use drugs.  Review of systems Constitutional: Negative for fever and unexpected weight change.  HENT: Negative for congestion, dental problem, ear pain, nosebleeds, postnasal drip, rhinorrhea, sinus pressure, sneezing, sore throat and trouble swallowing.   Eyes: Negative for redness and itching.  Respiratory: Negative for cough, chest tightness, shortness of breath and wheezing.   Cardiovascular: Negative for palpitations and leg swelling.  Gastrointestinal: Negative for nausea and vomiting.  Genitourinary: Negative for dysuria.  Musculoskeletal: Negative for joint swelling.  Skin: Negative for rash.  Neurological: Negative for headaches.  Hematological: Does not bruise/bleed easily.  Psychiatric/Behavioral: Negative for dysphoric mood. The patient is not nervous/anxious.     Current Outpatient Prescriptions on File Prior to Visit  Medication Sig  . aspirin 81 MG tablet Take 81 mg by mouth daily.  Marland Kitchen atorvastatin (LIPITOR) 20 MG tablet Take 1 tablet (20 mg total) by mouth daily at 6 PM.  . benazepril-hydrochlorthiazide (LOTENSIN HCT) 20-12.5 MG tablet Take 1 tablet by mouth daily.  . cetirizine (ZYRTEC) 10 MG tablet Take 10 mg by mouth as needed.   . fluticasone (FLONASE) 50 MCG/ACT nasal spray USE TWO SPRAY(S) IN EACH NOSTRIL ONCE DAILY  . guaiFENesin (MUCINEX) 600 MG 12 hr tablet Take 600 mg by mouth 2 (two) times daily.  Marland Kitchen linagliptin (TRADJENTA) 5 MG TABS tablet Take 1 tablet (5 mg total) by mouth daily.  . metFORMIN  (GLUCOPHAGE) 1000 MG tablet Take 1 tablet (1,000 mg total) by mouth 2 (two) times daily.  . psyllium (REGULOID) 0.52 G capsule Take 0.52 g by mouth daily.  . TRULICITY 1.91 YN/8.2NF SOPN INJECT .75MG  SUBCUTANEOUSLY ONCE A WEEK   No current facility-administered medications on file prior to visit.     Chief Complaint  Patient presents with  . Advice Only    FOLLOW UP AFTER A SLEEP STUDY  the patient states that he has loud snoring his wife has to leave the room because of it.  Epworth Score: 6    Sleep tests PSG 03/03/16 >> AHI 13.4, SaO2 low 85% CPAP titration 07/02/16 >> CPAP 14 cm H2O >> AHI 2.9  Past medical history He  has a past medical history of Diabetes mellitus without complication ([place]); H/O lymph node excision (1998); Hypertension; and Viral pneumonia (1965).  Vital signs BP 124/78 (BP Location: Left Arm, Cuff Size: Normal)   Pulse 91   Resp 16   Ht 5\' 10"  (1.778 m)   Wt 219 lb (99.3 kg)   SpO2 95%   BMI 31.42 kg/m   History of Present Illness Chord Takahashi is a 61 y.o. male for evaluation of sleep problems.  His wife has been concerned about his snoring.  She has also noticed that he stops breathing while asleep.  He has trouble sleeping on his back.  He doesn't dream much.  He does get tired toward the end of the day.  He goes to sleep at 11 pm.  He falls asleep quickly.  He wakes up 1 or 2 times to use  the bathroom.  He gets out of bed at 545 am on weekdays.  He will sleep an extra 2 hours on weekends.  He feels okay in the morning.  He denies morning headache.  He does not use anything to help him fall sleep or stay awake.  He denies sleep walking, sleep talking, bruxism, or nightmares.  There is no history of restless legs.  He denies sleep hallucinations, sleep paralysis, or cataplexy.  The Epworth score is 6 out of 24.  He has sleep study in September which showed mild to moderate sleep apnea.  He then had titration study.  After this he was advised to have  sleep medicine consultation.  Physical Exam:  General - No distress ENT - No sinus tenderness, no oral exudate, no LAN, no thyromegaly, TM clear, pupils equal/reactive, MP 4, enlarged tongue Cardiac - s1s2 regular, no murmur, pulses symmetric Chest - No wheeze/rales/dullness, good air entry, normal respiratory excursion Back - No focal tenderness Abd - Soft, non-tender, no organomegaly, + bowel sounds Ext - No edema Neuro - Normal strength, cranial nerves intact Skin - No rashes Psych - Normal mood, and behavior  Discussion: He has snoring, sleep disruption, witnessed apnea, and daytime sleepiness.  He has history of HTN and DM.  His recent sleep study showed mild to moderate sleep apnea.  We discussed how sleep apnea can affect various health problems, including risks for hypertension, cardiovascular disease, and diabetes.  We also discussed how sleep disruption can increase risks for accidents, such as while driving.  Weight loss as a means of improving sleep apnea was also reviewed.  Additional treatment options discussed were CPAP therapy, oral appliance, and surgical intervention.  Assessment/plan:  Obstructive sleep apnea. - will arrange for auto CPAP set up  Insufficient sleep. - advised to try dedicating more time to sleep on regular basis   Patient Instructions  Will arrange for CPAP set up  Follow up in 2 months after CPAP set up.  Can be with Dr. Halford Chessman or Nurse Practitioner.    [name], M.D. Pager [phone] 10/29/2016, 10:01 AM

## 2016-10-29 NOTE — Patient Instructions (Signed)
Will arrange for CPAP set up  Follow up in 2 months after CPAP set up.  Can be with Dr. Halford Chessman or Nurse Practitioner.

## 2016-10-29 NOTE — Progress Notes (Signed)
   Subjective:    Patient ID: [name], male    DOB: 02/26/1956, 61 y.o.   MRN: [id number]  HPI    Review of Systems  Constitutional: Negative for fever and unexpected weight change.  HENT: Negative for congestion, dental problem, ear pain, nosebleeds, postnasal drip, rhinorrhea, sinus pressure, sneezing, sore throat and trouble swallowing.   Eyes: Negative for redness and itching.  Respiratory: Negative for cough, chest tightness, shortness of breath and wheezing.   Cardiovascular: Negative for palpitations and leg swelling.  Gastrointestinal: Negative for nausea and vomiting.  Genitourinary: Negative for dysuria.  Musculoskeletal: Negative for joint swelling.  Skin: Negative for rash.  Neurological: Negative for headaches.  Hematological: Does not bruise/bleed easily.  Psychiatric/Behavioral: Negative for dysphoric mood. The patient is not nervous/anxious.        Objective:   Physical Exam        Assessment & Plan:

## 2016-10-30 ENCOUNTER — Other Ambulatory Visit: Payer: Self-pay | Admitting: Internal Medicine

## 2016-11-05 ENCOUNTER — Other Ambulatory Visit: Payer: Self-pay | Admitting: *Deleted

## 2016-11-05 MED ORDER — METFORMIN HCL 1000 MG PO TABS: 1000.0000 mg | ORAL_TABLET | Freq: Two times a day (BID) | ORAL | 1 refills | Status: DC

## 2016-11-05 NOTE — Telephone Encounter (Signed)
[facility] 

## 2016-11-24 ENCOUNTER — Telehealth: Payer: Self-pay

## 2016-11-24 NOTE — Telephone Encounter (Signed)
Spoke with patient and scheduled appt with Sherrie Mustache, NP tomorrow.

## 2016-11-24 NOTE — Telephone Encounter (Signed)
Patient called c/o sinus infection. Patient with productive cough (green phlegm), sinus headache, sinus pressure, sneezing, and nasal drainage(green). Symptoms onset Saturday 11/20/16  Patient denies fever and sore throat. Patient tried Zyrtec and Benadryl.  Patient was requesting appointment this week, no available appointments. Please advise

## 2016-11-25 ENCOUNTER — Encounter: Payer: Self-pay | Admitting: Nurse Practitioner

## 2016-11-25 ENCOUNTER — Ambulatory Visit (INDEPENDENT_AMBULATORY_CARE_PROVIDER_SITE_OTHER): Payer: PRIVATE HEALTH INSURANCE | Admitting: Nurse Practitioner

## 2016-11-25 VITALS — BP 136/84 | HR 63 | Temp 98.8°F | Resp 17 | Ht 70.0 in | Wt 221.8 lb

## 2016-11-25 DIAGNOSIS — J0140 Acute pansinusitis, unspecified: Secondary | ICD-10-CM | POA: Diagnosis not present

## 2016-11-25 MED ORDER — AMOXICILLIN 500 MG PO CAPS: 1000.0000 mg | ORAL_CAPSULE | Freq: Three times a day (TID) | ORAL | 0 refills | Status: DC

## 2016-11-25 NOTE — Progress Notes (Signed)
Careteam: Patient Care Team: Gayland Curry, DO as PCP - General (Geriatric Medicine) Lavonna Monarch, MD as Consulting Physician (Dermatology) Leandrew Koyanagi, MD as Attending Physician (Orthopedic Surgery) Roel Cluck, MD as Referring Physician (Ophthalmology) Deneise Lever, MD as Consulting Physician (Pulmonary Disease)  Advanced Directive information Does Patient Have a Medical Advance Directive?: No, Would patient like information on creating a medical advance directive?: Yes (MAU/Ambulatory/Procedural Areas - Information given)  No Known Allergies  Chief Complaint  Patient presents with  . Acute Visit    Pt is being seen due to sinus pain/pressure, headache, sinus drainage  x 5 days. Pt is taking zyrtec and mucinex (reg).      HPI: Patient is a 61 y.o. male seen in the office today due to sinus issues.  Has been on mucinex and flonase to prevent sinus problems but occasionally he will get increase congestion with headache since Saturday. Also taking zyrtec, started taking on Sunday.  Feels like it is getting worse.  No fever.   Review of Systems:  Review of Systems  Constitutional: Negative for chills, fever and malaise/fatigue.       Increase fatigue but no body aches  HENT: Positive for congestion and sinus pain. Negative for sore throat.   Respiratory: Positive for cough and sputum production.   Cardiovascular: Negative for chest pain.  Neurological: Positive for headaches.    Past Medical History:  Diagnosis Date  . Diabetes mellitus without complication ([facility])    Type 2  . H/O lymph node excision [place] , [place]  . Hypertension   . Viral pneumonia 1965   Past Surgical History:  Procedure Laterality Date  . SKIN BIOPSY Right 05/21/15   forearm Robyne Askew PA   Social History:   reports that he has never smoked. He has never used smokeless tobacco. He reports that he drinks about 0.6 oz of alcohol per week . He reports that he does not  use drugs.  Family History  Problem Relation Age of Onset  . Hypertension Mother   . Diabetes Mother 67       Diabetes type 2  . Heart disease Father 98       Heart Attack    Medications: Patient's Medications  New Prescriptions   No medications on file  Previous Medications   ASPIRIN 81 MG TABLET    Take 81 mg by mouth daily.   ATORVASTATIN (LIPITOR) 20 MG TABLET    Take 1 tablet (20 mg total) by mouth daily at 6 PM.   BENAZEPRIL-HYDROCHLORTHIAZIDE (LOTENSIN HCT) 20-12.5 MG TABLET    Take 1 tablet by mouth daily.   CETIRIZINE (ZYRTEC) 10 MG TABLET    Take 10 mg by mouth as needed.    FLUTICASONE (FLONASE) 50 MCG/ACT NASAL SPRAY    USE TWO SPRAY(S) IN EACH NOSTRIL ONCE DAILY   GUAIFENESIN (MUCINEX) 600 MG 12 HR TABLET    Take 600 mg by mouth 2 (two) times daily.   LINAGLIPTIN (TRADJENTA) 5 MG TABS TABLET    Take 1 tablet (5 mg total) by mouth daily.   METFORMIN (GLUCOPHAGE) 1000 MG TABLET    Take 1 tablet (1,000 mg total) by mouth 2 (two) times daily.   PSYLLIUM (REGULOID) 0.52 G CAPSULE    Take 0.52 g by mouth daily.   TRULICITY 4.03 KV/4.2VZ SOPN    INJECT 0.75 MG SUBCUTANEOUSLY ONCE A WEEK  Modified Medications   No medications on file  Discontinued Medications  No medications on file     Physical Exam:  Vitals:   11/25/16 1547  BP: 136/84  Pulse: 63  Resp: 17  Temp: 98.8 F (37.1 C)  TempSrc: Oral  SpO2: 97%  Weight: 221 lb 12.8 oz (100.6 kg)  Height: 5\' 10"  (1.778 m)   Body mass index is 31.82 kg/m.  Physical Exam  Constitutional: He is oriented to person, place, and time. He appears well-developed and well-nourished. No distress.  HENT:  Nose: Mucosal edema and rhinorrhea present.  Eyes:  glasses  Cardiovascular: Normal rate, regular rhythm, normal heart sounds and intact distal pulses.   Pulmonary/Chest: Effort normal and breath sounds normal. No respiratory distress.  Neurological: He is alert and oriented to person, place, and time.  Skin: Skin is  warm and dry.  Psychiatric: He has a normal mood and affect.   Labs reviewed: Basic Metabolic Panel:  Recent Labs  05/04/16 1026 09/08/16 0804  NA 140 140  K 4.3 3.9  CL 104 107  CO2 29 25  GLUCOSE 141* 155*  BUN 10 11  CREATININE 0.92 0.86  CALCIUM 8.9 8.7   Liver Function Tests:  Recent Labs  09/08/16 0804  AST 18  ALT 18  ALKPHOS 57  BILITOT 1.2  PROT 6.1  ALBUMIN 4.1   No results for input(s): LIPASE, AMYLASE in the last 8760 hours. No results for input(s): AMMONIA in the last 8760 hours. CBC: No results for input(s): WBC, NEUTROABS, HGB, HCT, MCV, PLT in the last 8760 hours. Lipid Panel:  Recent Labs  01/09/16 0840 09/08/16 0804  CHOL 94* 87  HDL 43 40*  LDLCALC 34 26  TRIG 83 103  CHOLHDL 2.2 2.2   TSH: No results for input(s): TSH in the last 8760 hours. A1C: Lab Results  Component Value Date   HGBA1C 6.6 (H) 09/08/2016     Assessment/Plan 1. Acute non-recurrent pansinusitis To use nettipot twice daily May use PLAIN nasal saline as needed throughout the day mucinex DM by mouth twice daily with full glass of water Increase water intake  - Rx given amoxicillin (AMOXIL) 500 MG capsule; Take 2 capsules (1,000 mg total) by mouth 3 (three) times daily.  Dispense: 42 capsule; Refill: 0 to take if symptoms persist or worsen.  Follow up precautions given Carlos American. Harle Battiest  [facility] [phone] 8 am - 5 pm) [phone] (after hours)

## 2016-11-25 NOTE — Patient Instructions (Addendum)
To use nettipot twice daily May use PLAIN nasal saline as needed throughout the day mucinex DM by mouth twice daily with full glass of water Increase water intake   Sinusitis, Adult Sinusitis is soreness and inflammation of your sinuses. Sinuses are hollow spaces in the bones around your face. Your sinuses are located:  Around your eyes.  In the middle of your forehead.  Behind your nose.  In your cheekbones.  Your sinuses and nasal passages are lined with a stringy fluid (mucus). Mucus normally drains out of your sinuses. When your nasal tissues become inflamed or swollen, the mucus can become trapped or blocked so air cannot flow through your sinuses. This allows bacteria, viruses, and funguses to grow, which leads to infection. Sinusitis can develop quickly and last for 7?10 days (acute) or for more than 12 weeks (chronic). Sinusitis often develops after a cold. What are the causes? This condition is caused by anything that creates swelling in the sinuses or stops mucus from draining, including:  Allergies.  Asthma.  Bacterial or viral infection.  Abnormally shaped bones between the nasal passages.  Nasal growths that contain mucus (nasal polyps).  Narrow sinus openings.  Pollutants, such as chemicals or irritants in the air.  A foreign object stuck in the nose.  A fungal infection. This is rare.  What increases the risk? The following factors may make you more likely to develop this condition:  Having allergies or asthma.  Having had a recent cold or respiratory tract infection.  Having structural deformities or blockages in your nose or sinuses.  Having a weak immune system.  Doing a lot of swimming or diving.  Overusing nasal sprays.  Smoking.  What are the signs or symptoms? The main symptoms of this condition are pain and a feeling of pressure around the affected sinuses. Other symptoms include:  Upper toothache.  Earache.  Headache.  Bad  breath.  Decreased sense of smell and taste.  A cough that may get worse at night.  Fatigue.  Fever.  Thick drainage from your nose. The drainage is often green and it may contain pus (purulent).  Stuffy nose or congestion.  Postnasal drip. This is when extra mucus collects in the throat or back of the nose.  Swelling and warmth over the affected sinuses.  Sore throat.  Sensitivity to light.  How is this diagnosed? This condition is diagnosed based on symptoms, a medical history, and a physical exam. To find out if your condition is acute or chronic, your health care provider may:  Look in your nose for signs of nasal polyps.  Tap over the affected sinus to check for signs of infection.  View the inside of your sinuses using an imaging device that has a light attached (endoscope).  If your health care provider suspects that you have chronic sinusitis, you may also:  Be tested for allergies.  Have a sample of mucus taken from your nose (nasal culture) and checked for bacteria.  Have a mucus sample examined to see if your sinusitis is related to an allergy.  If your sinusitis does not respond to treatment and it lasts longer than 8 weeks, you may have an MRI or CT scan to check your sinuses. These scans also help to determine how severe your infection is. In rare cases, a bone biopsy may be done to rule out more serious types of fungal sinus disease. How is this treated? Treatment for sinusitis depends on the cause and whether your condition  is chronic or acute. If a virus is causing your sinusitis, your symptoms will go away on their own within 10 days. You may be given medicines to relieve your symptoms, including:  Topical nasal decongestants. They shrink swollen nasal passages and let mucus drain from your sinuses.  Antihistamines. These drugs block inflammation that is triggered by allergies. This can help to ease swelling in your nose and sinuses.  Topical nasal  corticosteroids. These are nasal sprays that ease inflammation and swelling in your nose and sinuses.  Nasal saline washes. These rinses can help to get rid of thick mucus in your nose.  If your condition is caused by bacteria, you will be given an antibiotic medicine. If your condition is caused by a fungus, you will be given an antifungal medicine. Surgery may be needed to correct underlying conditions, such as narrow nasal passages. Surgery may also be needed to remove polyps. Follow these instructions at home: Medicines  Take, use, or apply over-the-counter and prescription medicines only as told by your health care provider. These may include nasal sprays.  If you were prescribed an antibiotic medicine, take it as told by your health care provider. Do not stop taking the antibiotic even if you start to feel better. Hydrate and Humidify  Drink enough water to keep your urine clear or pale yellow. Staying hydrated will help to thin your mucus.  Use a cool mist humidifier to keep the humidity level in your home above 50%.  Inhale steam for 10-15 minutes, 3-4 times a day or as told by your health care provider. You can do this in the bathroom while a hot shower is running.  Limit your exposure to cool or dry air. Rest  Rest as much as possible.  Sleep with your head raised (elevated).  Make sure to get enough sleep each night. General instructions  Apply a warm, moist washcloth to your face 3-4 times a day or as told by your health care provider. This will help with discomfort.  Wash your hands often with soap and water to reduce your exposure to viruses and other germs. If soap and water are not available, use hand sanitizer.  Do not smoke. Avoid being around people who are smoking (secondhand smoke).  Keep all follow-up visits as told by your health care provider. This is important. Contact a health care provider if:  You have a fever.  Your symptoms get worse.  Your  symptoms do not improve within 10 days. Get help right away if:  You have a severe headache.  You have persistent vomiting.  You have pain or swelling around your face or eyes.  You have vision problems.  You develop confusion.  Your neck is stiff.  You have trouble breathing. This information is not intended to replace advice given to you by your health care provider. Make sure you discuss any questions you have with your health care provider. Document Released: 06/07/2005 Document Revised: 02/01/2016 Document Reviewed: 04/02/2015 Elsevier Interactive Patient Education  2017 [employer].

## 2017-01-05 ENCOUNTER — Encounter: Payer: Self-pay | Admitting: Internal Medicine

## 2017-01-13 ENCOUNTER — Telehealth: Payer: Self-pay | Admitting: *Deleted

## 2017-01-13 ENCOUNTER — Other Ambulatory Visit: Payer: PRIVATE HEALTH INSURANCE

## 2017-01-13 DIAGNOSIS — I10 Essential (primary) hypertension: Secondary | ICD-10-CM

## 2017-01-13 DIAGNOSIS — E785 Hyperlipidemia, unspecified: Secondary | ICD-10-CM

## 2017-01-13 DIAGNOSIS — E119 Type 2 diabetes mellitus without complications: Secondary | ICD-10-CM

## 2017-01-13 DIAGNOSIS — E1169 Type 2 diabetes mellitus with other specified complication: Secondary | ICD-10-CM

## 2017-01-13 LAB — COMPLETE METABOLIC PANEL WITH GFR
ALT: 17 U/L (ref 9–46)
AST: 17 U/L (ref 10–35)
Albumin: 4.1 g/dL (ref 3.6–5.1)
Alkaline Phosphatase: 54 U/L (ref 40–115)
BUN: 15 mg/dL (ref 7–25)
CO2: 20 mmol/L (ref 20–31)
Calcium: 8.7 mg/dL (ref 8.6–10.3)
Chloride: 103 mmol/L (ref 98–110)
Creat: 0.97 mg/dL (ref 0.70–1.25)
GFR, Est African American: >89 mL/min (ref >=60)
GFR, Est Non African American: 84 mL/min (ref >=60)
Glucose, Bld: 121 mg/dL — ABNORMAL HIGH (ref 65–99)
Potassium: 3.8 mmol/L (ref 3.5–5.3)
Sodium: 139 mmol/L (ref 135–146)
Total Bilirubin: 1.3 mg/dL — ABNORMAL HIGH (ref 0.2–1.2)
Total Protein: 6.1 g/dL (ref 6.1–8.1)

## 2017-01-13 LAB — CBC WITH DIFFERENTIAL/PLATELET
Basophils Absolute: 44 cells/uL (ref 0–200)
Basophils Relative: 1 %
Eosinophils Absolute: 88 cells/uL (ref 15–500)
Eosinophils Relative: 2 %
HCT: 40.7 % (ref 38.5–50.0)
Hemoglobin: 13.8 g/dL (ref 13.2–17.1)
Lymphocytes Relative: 30 %
Lymphs Abs: 1320 cells/uL (ref 850–3900)
MCH: 30.8 pg (ref 27.0–33.0)
MCHC: 33.9 g/dL (ref 32.0–36.0)
MCV: 90.8 fL (ref 80.0–100.0)
MPV: 8.8 fL (ref 7.5–12.5)
Monocytes Absolute: 308 cells/uL (ref 200–950)
Monocytes Relative: 7 %
Neutro Abs: 2640 cells/uL (ref 1500–7800)
Neutrophils Relative %: 60 %
Platelets: 249 10*3/uL (ref 140–400)
RBC: 4.48 MIL/uL (ref 4.20–5.80)
RDW: 13.9 % (ref 11.0–15.0)
WBC: 4.4 10*3/uL (ref 3.8–10.8)

## 2017-01-13 LAB — LIPID PANEL
Cholesterol: 106 mg/dL (ref <200)
HDL: 40 mg/dL — ABNORMAL LOW (ref >40)
LDL Cholesterol: 52 mg/dL (ref <100)
Total CHOL/HDL Ratio: 2.7 Ratio (ref <5.0)
Triglycerides: 69 mg/dL (ref <150)
VLDL: 14 mg/dL (ref <30)

## 2017-01-13 NOTE — Telephone Encounter (Signed)
Patient dropped off [place] [employer] [place] Form. Patient had emailed Dr. Mariea Clonts on 7/18 and she instructed patient to drop off forms at his lab appointment so she could review them before his appointment on 7/30.  Placed forms in Dr. Cyndi Lennert folder along with the email message.

## 2017-01-14 LAB — HEMOGLOBIN A1C
Hgb A1c MFr Bld: 6.6 % — ABNORMAL HIGH (ref <5.7)
Mean Plasma Glucose: 143 mg/dL

## 2017-01-17 ENCOUNTER — Ambulatory Visit (INDEPENDENT_AMBULATORY_CARE_PROVIDER_SITE_OTHER): Payer: PRIVATE HEALTH INSURANCE | Admitting: Internal Medicine

## 2017-01-17 ENCOUNTER — Encounter: Payer: Self-pay | Admitting: Internal Medicine

## 2017-01-17 ENCOUNTER — Encounter: Payer: Self-pay | Admitting: *Deleted

## 2017-01-17 VITALS — BP 132/72 | HR 72 | Temp 99.2°F | Resp 16 | Ht 67.0 in | Wt 220.0 lb

## 2017-01-17 DIAGNOSIS — I10 Essential (primary) hypertension: Secondary | ICD-10-CM | POA: Diagnosis not present

## 2017-01-17 DIAGNOSIS — Z0000 Encounter for general adult medical examination without abnormal findings: Secondary | ICD-10-CM | POA: Diagnosis not present

## 2017-01-17 DIAGNOSIS — E1169 Type 2 diabetes mellitus with other specified complication: Secondary | ICD-10-CM

## 2017-01-17 DIAGNOSIS — G4733 Obstructive sleep apnea (adult) (pediatric): Secondary | ICD-10-CM

## 2017-01-17 DIAGNOSIS — Z6834 Body mass index (BMI) 34.0-34.9, adult: Secondary | ICD-10-CM | POA: Diagnosis not present

## 2017-01-17 DIAGNOSIS — E785 Hyperlipidemia, unspecified: Secondary | ICD-10-CM | POA: Diagnosis not present

## 2017-01-17 DIAGNOSIS — E119 Type 2 diabetes mellitus without complications: Secondary | ICD-10-CM

## 2017-01-17 NOTE — Patient Instructions (Addendum)
Let me know what your insurance says about shingrix at the pharmacy.  Also, if your congestion worsens, you develop temperature over 100.6, or further sinus symptoms, please let me know.    Fat and Cholesterol Restricted Diet Getting too much fat and cholesterol in your diet may cause health problems. Following this diet helps keep your fat and cholesterol at normal levels. This can keep you from getting sick. What types of fat should I choose?  Choose monosaturated and polyunsaturated fats. These are found in foods such as olive oil, canola oil, flaxseeds, walnuts, almonds, and seeds.  Eat more omega-3 fats. Good choices include salmon, mackerel, sardines, tuna, flaxseed oil, and ground flaxseeds.  Limit saturated fats. These are in animal products such as meats, butter, and cream. They can also be in plant products such as palm oil, palm kernel oil, and coconut oil.  Avoid foods with partially hydrogenated oils in them. These contain trans fats. Examples of foods that have trans fats are stick margarine, some tub margarines, cookies, crackers, and other baked goods. What general guidelines do I need to follow?  Check food labels. Look for the words "trans fat" and "saturated fat."  When preparing a meal: ? Fill half of your plate with vegetables and green salads. ? Fill one fourth of your plate with whole grains. Look for the word "whole" as the first word in the ingredient list. ? Fill one fourth of your plate with lean protein foods.  Eat more foods that have fiber, like apples, carrots, beans, peas, and barley.  Eat more home-cooked foods. Eat less at restaurants and buffets.  Limit or avoid alcohol.  Limit foods high in starch and sugar.  Limit fried foods.  Cook foods without frying them. Baking, boiling, grilling, and broiling are all great options.  Lose weight if you are overweight. Losing even a small amount of weight can help your overall health. It can also help prevent  diseases such as diabetes and heart disease. What foods can I eat? Grains Whole grains, such as whole wheat or whole grain breads, crackers, cereals, and pasta. Unsweetened oatmeal, bulgur, barley, quinoa, or brown rice. Corn or whole wheat flour tortillas. Vegetables Fresh or frozen vegetables (raw, steamed, roasted, or grilled). Green salads. Fruits All fresh, canned (in natural juice), or frozen fruits. Meat and Other Protein Products Ground beef (85% or leaner), grass-fed beef, or beef trimmed of fat. Skinless chicken or [place]. Ground chicken or [place]. Pork trimmed of fat. All fish and seafood. Eggs. Dried beans, peas, or lentils. Unsalted nuts or seeds. Unsalted canned or dry beans. Dairy Low-fat dairy products, such as skim or 1% milk, 2% or reduced-fat cheeses, low-fat ricotta or cottage cheese, or plain low-fat yogurt. Fats and Oils Tub margarines without trans fats. Light or reduced-fat mayonnaise and salad dressings. Avocado. Olive, canola, sesame, or safflower oils. Natural peanut or almond butter (choose ones without added sugar and oil). The items listed above may not be a complete list of recommended foods or beverages. Contact your dietitian for more options. What foods are not recommended? Grains White bread. White pasta. White rice. Cornbread. Bagels, pastries, and croissants. Crackers that contain trans fat. Vegetables White potatoes. Corn. Creamed or fried vegetables. Vegetables in a cheese sauce. Fruits Dried fruits. Canned fruit in light or heavy syrup. Fruit juice. Meat and Other Protein Products Fatty cuts of meat. Ribs, chicken wings, bacon, sausage, bologna, salami, chitterlings, fatback, hot dogs, bratwurst, and packaged luncheon meats. Liver and organ meats. Dairy  Whole or 2% milk, cream, half-and-half, and cream cheese. Whole milk cheeses. Whole-fat or sweetened yogurt. Full-fat cheeses. Nondairy creamers and whipped toppings. Processed cheese, cheese spreads,  or cheese curds. Sweets and Desserts Corn syrup, sugars, honey, and molasses. Candy. Jam and jelly. Syrup. Sweetened cereals. Cookies, pies, cakes, donuts, muffins, and ice cream. Fats and Oils Butter, stick margarine, lard, shortening, ghee, or bacon fat. Coconut, palm kernel, or palm oils. Beverages Alcohol. Sweetened drinks (such as sodas, lemonade, and fruit drinks or punches). The items listed above may not be a complete list of foods and beverages to avoid. Contact your dietitian for more information. This information is not intended to replace advice given to you by your health care provider. Make sure you discuss any questions you have with your health care provider. Document Released: 12/07/2011 Document Revised: 02/12/2016 Document Reviewed: 09/06/2013 Elsevier Interactive Patient Education  [employer].

## 2017-01-17 NOTE — Progress Notes (Addendum)
Provider:  Rexene Edison. Mariea Clonts, D.O., C.M.D. Location:   [facility]  Place of Service:   clinic  Previous PCP: Gayland Curry, DO Patient Care Team: Gayland Curry, DO as PCP - General (Geriatric Medicine) Lavonna Monarch, MD as Consulting Physician (Dermatology) Leandrew Koyanagi, MD as Attending Physician (Orthopedic Surgery) Roel Cluck, MD as Referring Physician (Ophthalmology) Deneise Lever, MD as Consulting Physician (Pulmonary Disease)  Extended Emergency Contact Information Primary Emergency Contact: Hammers,Kathy Address: [address]          [place], [place] [address] Johnnette Litter of [place] Phone: [phone] Relation: Spouse  Code Status: full code Goals of Care: Advanced Directive information Advanced Directives 01/17/2017  Does Patient Have a Medical Advance Directive? No  Copy of Healthcare Power of Attorney in Chart? -  Would patient like information on creating a medical advance directive? No - Patient declined   Chief Complaint  Patient presents with  . Annual Exam    CPE and medical exam form to be filled out    HPI: Patient is a 61 y.o. West seen today for an annual physical exam and physical form for [facility].  DMII without complications:  Has been well controlled with trulicity, asa, statin, tradjenta, metformin, benazepril.   Lab Results  Component Value Date   HGBA1C 6.6 (H) 01/13/2017    BP at goal with current regimen benazpril/hctz.    Hyperlipidemia:  On statin therapy.   Lab Results  Component Value Date   CHOL 106 01/13/2017   CHOL 87 09/08/2016   CHOL 94 (L) 01/09/2016   Lab Results  Component Value Date   HDL 40 (L) 01/13/2017   HDL 40 (L) 09/08/2016   HDL 43 01/09/2016   Lab Results  Component Value Date   LDLCALC 52 01/13/2017   LDLCALC 26 09/08/2016   LDLCALC 34 01/09/2016   Lab Results  Component Value Date   TRIG 69 01/13/2017   TRIG 103 09/08/2016   TRIG 83 01/09/2016   Lab Results    Component Value Date   CHOLHDL 2.7 01/13/2017   CHOLHDL 2.2 09/08/2016   CHOLHDL 2.2 01/09/2016   Shingrix is said to be covered at a doctor's visit per his insurance, but now we don't have it anymore.  He's going to have his wife call to see if it's also covered at the pharmacy.    He is on his CPAP machine since June 16th for his OSA.  At first, he felt claustrophobic with it, but that's worked out.  He starts off on his back, and cord gets tangled up if he turns.  He's making sure he wears it for 4 hrs.  Does not notice any more rested in daytime, but humidifier in it has helped with his sinus congestion.    He is walking for exercise.  Heat outside slowed things down.  Walks dog each morning.  Down 2 lbs since 6/7.    Past Medical History:  Diagnosis Date  . Diabetes mellitus without complication (HCC)    Type 2  . H/O lymph node excision [place] , [place]  . Hypertension   . Viral pneumonia 1965   Past Surgical History:  Procedure Laterality Date  . SKIN BIOPSY Right 05/21/15   forearm Robyne Askew PA    reports that he has never smoked. He has never used smokeless tobacco. He reports that he drinks about 0.6 oz of alcohol per week . He reports that  he does not use drugs.  Functional Status Survey:  independent in adls and iadls, works   Family History  Problem Relation Age of Onset  . Hypertension Mother   . Diabetes Mother 29       Diabetes type 2  . Heart disease Father 65       Heart Attack    Health Maintenance  Topic Date Due  . INFLUENZA VACCINE  01/19/2017  . TETANUS/TDAP  01/19/2017  . OPHTHALMOLOGY EXAM  04/16/2017  . FOOT EXAM  05/07/2017  . HEMOGLOBIN A1C  07/16/2017  . COLONOSCOPY  02/22/2024  . PNEUMOCOCCAL POLYSACCHARIDE VACCINE  Completed  . Hepatitis C Screening  Completed  . HIV Screening  Addressed    No Known Allergies  Allergies as of 01/17/2017   No Known Allergies     Medication List       Accurate as of 01/17/17  9:28 AM.  Always use your most recent med list.          aspirin 81 MG tablet Take 81 mg by mouth daily.   atorvastatin 20 MG tablet Commonly known as:  LIPITOR Take 1 tablet (20 mg total) by mouth daily at 6 PM.   benazepril-hydrochlorthiazide 20-12.5 MG tablet Commonly known as:  LOTENSIN HCT Take 1 tablet by mouth daily.   cetirizine 10 MG tablet Commonly known as:  ZYRTEC Take 10 mg by mouth as needed.   fluticasone 50 MCG/ACT nasal spray Commonly known as:  FLONASE USE TWO SPRAY(S) IN EACH NOSTRIL ONCE DAILY   linagliptin 5 MG Tabs tablet Commonly known as:  TRADJENTA Take 1 tablet (5 mg total) by mouth daily.   metFORMIN 1000 MG tablet Commonly known as:  GLUCOPHAGE Take 1 tablet (1,000 mg total) by mouth 2 (two) times daily.   MUCINEX 600 MG 12 hr tablet Generic drug:  guaiFENesin Take 600 mg by mouth 2 (two) times daily.   psyllium 0.52 g capsule Commonly known as:  REGULOID Take 0.52 g by mouth daily.   TRULICITY 1.49 FW/2.6VZ Sopn Generic drug:  Dulaglutide INJECT 0.75 MG SUBCUTANEOUSLY ONCE A WEEK       Review of Systems  Constitutional: Negative for chills, fever and malaise/fatigue.       Low grade temp  HENT: Negative for hearing loss.        Dull right TM, normal left with good light reflex  Eyes:       Glasses  Respiratory: Negative for cough and shortness of breath.   Cardiovascular: Negative for chest pain, palpitations and leg swelling.  Gastrointestinal: Negative for abdominal pain, blood in stool, constipation and melena.  Genitourinary: Negative for dysuria, frequency and urgency.  Musculoskeletal: Positive for joint pain. Negative for falls.       Right knee with chronic meniscal tear  Skin: Negative for itching and rash.  Neurological: Negative for dizziness, tingling, sensory change, loss of consciousness and weakness.  Endo/Heme/Allergies: Does not bruise/bleed easily.  Psychiatric/Behavioral: Negative for depression and memory loss. The  patient is not nervous/anxious and does not have insomnia.     Vitals:   01/17/17 0911  BP: 132/72  Pulse: 72  Resp: 16  Temp: 99.2 F (37.3 C)  TempSrc: Oral  SpO2: 96%  Weight: 220 lb (99.8 kg)  Height: 5\' 7"  (1.702 m)   Body mass index is 34.46 kg/m. Physical Exam  Constitutional: He is oriented to person, place, and time. He appears well-developed and well-nourished. No distress.  HENT:  Head: Normocephalic and  atraumatic.  Right Ear: External ear normal.  Left Ear: External ear normal.  Nose: Nose normal.  Mouth/Throat: Oropharynx is clear and moist. No oropharyngeal exudate.  Some right sinus congestion and dull right TM, left with normal light reflex  Eyes: Pupils are equal, round, and reactive to light. Conjunctivae and EOM are normal.  Neck: Normal range of motion. Neck supple. No JVD present.  Cardiovascular: Normal rate, regular rhythm, normal heart sounds and intact distal pulses.   No murmur heard. Pulmonary/Chest: Effort normal and breath sounds normal. No respiratory distress.  Abdominal: Soft. Bowel sounds are normal. He exhibits no distension. There is no tenderness.  Genitourinary:  Genitourinary Comments: deferred  Musculoskeletal: Normal range of motion.  Lymphadenopathy:    He has no cervical adenopathy.  Neurological: He is alert and oriented to person, place, and time.  Skin: Skin is warm and dry. Capillary refill takes less than 2 seconds.  Psychiatric: He has a normal mood and affect. His behavior is normal. Judgment and thought content normal.    Labs reviewed: Basic Metabolic Panel:  Recent Labs  05/04/16 1026 09/08/16 0804 01/13/17 0817  NA 140 140 139  K 4.3 3.9 3.8  CL 104 107 103  CO2 29 25 20   GLUCOSE 141* 155* 121*  BUN 10 11 15   CREATININE 0.92 0.86 0.97  CALCIUM 8.9 8.7 8.7   Liver Function Tests:  Recent Labs  09/08/16 0804 01/13/17 0817  AST 18 17  ALT 18 17  ALKPHOS 57 54  BILITOT 1.2 1.3*  PROT 6.1 6.1    ALBUMIN 4.1 4.1   No results for input(s): LIPASE, AMYLASE in the last 8760 hours. No results for input(s): AMMONIA in the last 8760 hours. CBC:  Recent Labs  01/13/17 0817  WBC 4.4  NEUTROABS 2,640  HGB 13.8  HCT 40.7  MCV 90.8  PLT 249   Lab Results  Component Value Date   HGBA1C 6.6 (H) 01/13/2017   Imaging and Procedures Recently: EKG this am:  NSR at 79bpm, Left axis deviation, RBBB and left anterior fascicular block, RVH with repolarization abnormality  Assessment/Plan 1. Annual physical exam -performed today along with physical form for [facility]  2. Body mass index (BMI) of 34.0-34.9 in adult -counseled on increasing his exercise regimen with more walking -cont diet, diabetes regimen which has helped some with weight loss  3. Essential hypertension -bp at goal with current therapy, on ace - Basic metabolic panel; Future  4. Hyperlipidemia associated with type 2 diabetes mellitus ([place]) -cont statin therapy  5. Controlled type 2 diabetes mellitus without complication, without long-term current use of insulin (HCC) - glucose controlled with current regimen--gradually improving and weight very slowly trending down, cont same and monitor - Hemoglobin A1c; Future - Basic metabolic panel; Future - CBC with Differential/Platelet; Future  6. Mild obstructive sleep apnea -cont cpap as ordered--has improved his sinus congestion  7.  Low grade temp:  Suspect he's developing otitis media, but conservative tx recommended and consider abx if develops true fever congestion that persist for several days.  He does not currently meet these criteria.  Labs/tests ordered: Orders Placed This Encounter  Procedures  . Hemoglobin A1c    Standing Status:   Future    Standing Expiration Date:   09/17/2017  . Basic metabolic panel    Standing Status:   Future    Standing Expiration Date:   09/17/2017    Order Specific Question:   Has the patient fasted?  Answer:   Yes   . CBC with Differential/Platelet    Standing Status:   Future    Standing Expiration Date:   09/17/2017    Haytham Maher L. Berniece Abid, D.O. [facility] [address], [place] [address] Cell Phone (Mon-Fri 8am-5pm):  [phone] On Call:  [phone] & follow prompts after 5pm & weekends Office Phone:  [phone] Office Fax:  [phone]

## 2017-01-22 ENCOUNTER — Other Ambulatory Visit: Payer: Self-pay | Admitting: Internal Medicine

## 2017-01-22 DIAGNOSIS — E1169 Type 2 diabetes mellitus with other specified complication: Secondary | ICD-10-CM

## 2017-01-22 DIAGNOSIS — E785 Hyperlipidemia, unspecified: Principal | ICD-10-CM

## 2017-01-27 ENCOUNTER — Encounter: Payer: Self-pay | Admitting: Internal Medicine

## 2017-01-28 ENCOUNTER — Telehealth: Payer: Self-pay

## 2017-01-28 MED ORDER — ZOSTER VAC RECOMB ADJUVANTED 50 MCG/0.5ML IM SUSR: 0.5000 mL | Freq: Once | INTRAMUSCULAR | 1 refills | Status: AC

## 2017-01-28 NOTE — Telephone Encounter (Signed)
Patient sent a message via mychart asking that the shingrix prescription be sent to his pharmacy. Rx sent electronically.

## 2017-02-07 ENCOUNTER — Encounter: Payer: Self-pay | Admitting: Adult Health

## 2017-02-07 ENCOUNTER — Ambulatory Visit (INDEPENDENT_AMBULATORY_CARE_PROVIDER_SITE_OTHER): Payer: PRIVATE HEALTH INSURANCE | Admitting: Adult Health

## 2017-02-07 DIAGNOSIS — G4733 Obstructive sleep apnea (adult) (pediatric): Secondary | ICD-10-CM

## 2017-02-07 DIAGNOSIS — E669 Obesity, unspecified: Secondary | ICD-10-CM

## 2017-02-07 DIAGNOSIS — Z6832 Body mass index (BMI) 32.0-32.9, adult: Secondary | ICD-10-CM | POA: Diagnosis not present

## 2017-02-07 NOTE — Progress Notes (Signed)
@Patient  ID: [name], male    DOB: 25-Feb-1956, 61 y.o.   MRN: [id number]  Chief Complaint  Patient presents with  . Follow-up    OSA    Referring provider: Gayland Curry, DO  HPI: 61 year old male followed for sleep apnea on C Pap  TEST  PSG 03/03/16 >> AHI 13.4, SaO2 low 85% CPAP titration 07/02/16 >> CPAP 14 cm H2O >> AHI 2.9   02/07/2017 Follow up ; OSA  Patient presents for a three-month follow-up for sleep apnea. Patient was recently started on C Pap for mild sleep apnea. A previous sleep study in September showed mild sleep apnea with AHI 13.4. A C Pap titration study in January showed optimal control on 14 cm H2O. Patient says he is doing good on C Pap. He does wear it each night. Patient's getting in about 4-5 hours each night.  Snoring has resolved . Feels rested in am. . Download shows excellent compliance with average usage at 4.5 hours. Patient is on AutoSet 5-15 some is H2O. AHI 2.4. Positive leaks. Discussed wt loss.  Had recent tooth extraction , was not able to wear for couple of nights.   No Known Allergies  Immunization History  Administered Date(s) Administered  . Hepatitis B 04/21/2010, 05/21/2010  . Influenza,inj,Quad PF,36+ Mos 02/27/2015  . Influenza-Unspecified 04/04/2014, 03/24/2016  . Pneumococcal Conjugate-13 09/02/2014  . Pneumococcal Polysaccharide-23 03/21/2006, 09/10/2016  . Tdap 01/20/2007  . Zoster 05/07/2016    Past Medical History:  Diagnosis Date  . Diabetes mellitus without complication (HCC)    Type 2  . H/O lymph node excision [place] , [place]  . Hypertension   . Viral pneumonia 1965    Tobacco History: History  Smoking Status  . Never Smoker  Smokeless Tobacco  . Never Used   Counseling given: Not Answered   Outpatient Encounter Prescriptions as of 02/07/2017  Medication Sig  . aspirin 81 MG tablet Take 81 mg by mouth daily.  Marland Kitchen atorvastatin (LIPITOR) 20 MG tablet TAKE 1 TABLET BY MOUTH ONCE DAILY 6 IN THE  EVENING  . benazepril-hydrochlorthiazide (LOTENSIN HCT) 20-12.5 MG tablet Take 1 tablet by mouth daily.  . cetirizine (ZYRTEC) 10 MG tablet Take 10 mg by mouth as needed.   . fluticasone (FLONASE) 50 MCG/ACT nasal spray USE TWO SPRAY(S) IN EACH NOSTRIL ONCE DAILY  . guaiFENesin (MUCINEX) 600 MG 12 hr tablet Take 600 mg by mouth 2 (two) times daily.  Marland Kitchen linagliptin (TRADJENTA) 5 MG TABS tablet Take 1 tablet (5 mg total) by mouth daily.  . metFORMIN (GLUCOPHAGE) 1000 MG tablet Take 1 tablet (1,000 mg total) by mouth 2 (two) times daily.  . psyllium (REGULOID) 0.52 G capsule Take 0.52 g by mouth daily.  . TRULICITY 9.92 EQ/6.8TM SOPN INJECT 0.75 MG SUBCUTANEOUSLY ONCE A WEEK   No facility-administered encounter medications on file as of 02/07/2017.      Review of Systems  Constitutional:   No  weight loss, night sweats,  Fevers, chills, fatigue, or  lassitude.  HEENT:   No headaches,  Difficulty swallowing,  Tooth/dental problems, or  Sore throat,                No sneezing, itching, ear ache, nasal congestion, post nasal drip,   CV:  No chest pain,  Orthopnea, PND, swelling in lower extremities, anasarca, dizziness, palpitations, syncope.   GI  No heartburn, indigestion, abdominal pain, nausea, vomiting, diarrhea, change in bowel habits, loss of appetite, bloody stools.  Resp: No shortness of breath with exertion or at rest.  No excess mucus, no productive cough,  No non-productive cough,  No coughing up of blood.  No change in color of mucus.  No wheezing.  No chest wall deformity  Skin: no rash or lesions.  GU: no dysuria, change in color of urine, no urgency or frequency.  No flank pain, no hematuria   MS:  No joint pain or swelling.  No decreased range of motion.  No back pain.    Physical Exam  BP 134/82 (BP Location: Left Arm, Cuff Size: Normal)   Pulse 83   Ht 5\' 8"  (1.727 m)   Wt 216 lb 9.6 oz (98.2 kg)   SpO2 97%   BMI 32.93 kg/m   GEN: A/Ox3; pleasant , NAD, obese      HEENT:  [place]/AT,  EACs-clear, TMs-wnl, NOSE-clear, THROAT-clear, no lesions, no postnasal drip or exudate noted. Class 2-3 MP airway   NECK:  Supple w/ fair ROM; no JVD; normal carotid impulses w/o bruits; no thyromegaly or nodules palpated; no lymphadenopathy.    RESP  Clear  P & A; w/o, wheezes/ rales/ or rhonchi. no accessory muscle use, no dullness to percussion  CARD:  RRR, no m/r/g, no peripheral edema, pulses intact, no cyanosis or clubbing.  GI:   Soft & nt; nml bowel sounds; no organomegaly or masses detected.   Musco: Warm bil, no deformities or joint swelling noted.   Neuro: alert, no focal deficits noted.    Skin: Warm, no lesions or rashes    Lab Results:   BNP No results found for: BNP  ProBNP No results found for: PROBNP  Imaging: No results found.   Assessment & Plan:   Mild obstructive sleep apnea Well controlled on C Pap  Plan  Patient Instructions  Continue on CPAP At bedtime   Wear for at least 4-6 hours each night. No weight loss Do not drive a sleepy Follow-up in 6 months with Dr. Halford Chessman  And As needed      Obesity Wt loss      Rexene Edison, NP 02/07/2017

## 2017-02-07 NOTE — Assessment & Plan Note (Signed)
Well controlled on C Pap  Plan  Patient Instructions  Continue on CPAP At bedtime   Wear for at least 4-6 hours each night. No weight loss Do not drive a sleepy Follow-up in 6 months with Dr. Halford Chessman  And As needed

## 2017-02-07 NOTE — Patient Instructions (Signed)
Continue on CPAP At bedtime   Wear for at least 4-6 hours each night. No weight loss Do not drive a sleepy Follow-up in 6 months with Dr. Halford Chessman  And As needed

## 2017-02-07 NOTE — Assessment & Plan Note (Signed)
Wt loss  

## 2017-02-08 NOTE — Progress Notes (Signed)
I have reviewed and agree with assessment/plan.  Chesley Mires, MD [facility] Pulmonary/Critical Care 02/08/2017, 7:44 AM Pager:  [phone]

## 2017-02-12 ENCOUNTER — Other Ambulatory Visit: Payer: Self-pay | Admitting: Internal Medicine

## 2017-04-19 ENCOUNTER — Encounter: Payer: Self-pay | Admitting: *Deleted

## 2017-04-19 LAB — HM DIABETES EYE EXAM

## 2017-05-07 ENCOUNTER — Other Ambulatory Visit: Payer: Self-pay | Admitting: Internal Medicine

## 2017-05-16 ENCOUNTER — Other Ambulatory Visit: Payer: Self-pay | Admitting: Internal Medicine

## 2017-05-20 ENCOUNTER — Other Ambulatory Visit: Payer: PRIVATE HEALTH INSURANCE

## 2017-05-20 DIAGNOSIS — E119 Type 2 diabetes mellitus without complications: Secondary | ICD-10-CM

## 2017-05-20 DIAGNOSIS — I10 Essential (primary) hypertension: Secondary | ICD-10-CM

## 2017-05-21 LAB — HEMOGLOBIN A1C
Hgb A1c MFr Bld: 7 % of total Hgb — ABNORMAL HIGH (ref <5.7)
Mean Plasma Glucose: 154 (calc)
eAG (mmol/L): 8.5 (calc)

## 2017-05-21 LAB — BASIC METABOLIC PANEL
BUN: 13 mg/dL (ref 7–25)
CO2: 27 mmol/L (ref 20–32)
Calcium: 8.8 mg/dL (ref 8.6–10.3)
Chloride: 103 mmol/L (ref 98–110)
Creat: 0.86 mg/dL (ref 0.70–1.25)
Glucose, Bld: 147 mg/dL — ABNORMAL HIGH (ref 65–99)
Potassium: 4.2 mmol/L (ref 3.5–5.3)
Sodium: 138 mmol/L (ref 135–146)

## 2017-05-21 LAB — CBC WITH DIFFERENTIAL/PLATELET
Basophils Absolute: 53 cells/uL (ref 0–200)
Basophils Relative: 1 %
Eosinophils Absolute: 80 cells/uL (ref 15–500)
Eosinophils Relative: 1.5 %
HCT: 40.6 % (ref 38.5–50.0)
Hemoglobin: 14.1 g/dL (ref 13.2–17.1)
Lymphs Abs: 1251 cells/uL (ref 850–3900)
MCH: 30.3 pg (ref 27.0–33.0)
MCHC: 34.7 g/dL (ref 32.0–36.0)
MCV: 87.1 fL (ref 80.0–100.0)
MPV: 9.2 fL (ref 7.5–12.5)
Monocytes Relative: 8.9 %
Neutro Abs: 3445 cells/uL (ref 1500–7800)
Neutrophils Relative %: 65 %
Platelets: 256 10*3/uL (ref 140–400)
RBC: 4.66 10*6/uL (ref 4.20–5.80)
RDW: 12.8 % (ref 11.0–15.0)
Total Lymphocyte: 23.6 %
WBC mixed population: 472 cells/uL (ref 200–950)
WBC: 5.3 10*3/uL (ref 3.8–10.8)

## 2017-05-26 ENCOUNTER — Encounter: Payer: Self-pay | Admitting: Internal Medicine

## 2017-05-26 ENCOUNTER — Ambulatory Visit: Payer: PRIVATE HEALTH INSURANCE | Admitting: Internal Medicine

## 2017-05-26 VITALS — BP 130/70 | HR 77 | Temp 98.8°F | Wt 220.0 lb

## 2017-05-26 DIAGNOSIS — E119 Type 2 diabetes mellitus without complications: Secondary | ICD-10-CM | POA: Diagnosis not present

## 2017-05-26 DIAGNOSIS — I10 Essential (primary) hypertension: Secondary | ICD-10-CM

## 2017-05-26 DIAGNOSIS — G4733 Obstructive sleep apnea (adult) (pediatric): Secondary | ICD-10-CM

## 2017-05-26 DIAGNOSIS — E669 Obesity, unspecified: Secondary | ICD-10-CM

## 2017-05-26 DIAGNOSIS — S83241D Other tear of medial meniscus, current injury, right knee, subsequent encounter: Secondary | ICD-10-CM

## 2017-05-26 DIAGNOSIS — E785 Hyperlipidemia, unspecified: Secondary | ICD-10-CM | POA: Diagnosis not present

## 2017-05-26 DIAGNOSIS — Z6832 Body mass index (BMI) 32.0-32.9, adult: Secondary | ICD-10-CM | POA: Diagnosis not present

## 2017-05-26 DIAGNOSIS — E1169 Type 2 diabetes mellitus with other specified complication: Secondary | ICD-10-CM

## 2017-05-26 DIAGNOSIS — Z6834 Body mass index (BMI) 34.0-34.9, adult: Secondary | ICD-10-CM | POA: Diagnosis not present

## 2017-05-26 NOTE — Progress Notes (Signed)
Location:  [facility] clinic Provider:  Deondrick Searls L. Mariea Clonts, D.O., C.M.D.  Code Status: full code Goals of Care:  Need to discuss again at next visit Advanced Directives 01/17/2017  Does Patient Have a Medical Advance Directive? No  Copy of Healthcare Power of Attorney in Chart? -  Would patient like information on creating a medical advance directive? No - Patient declined   Chief Complaint  Patient presents with  . Medical Management of Chronic Issues    38mth follow-up. discuss labs    HPI: Patient is a 61 y.o. male seen today for medical management of chronic diseases.    Reports he was eating halloween candy and then it was thanksgiving.  He reports things will get better now and he's well aware.  Now 7.0 when it was 6.6.  No increases or decreases in his walking.    He saw Dr. Rex Kras and he had no retinopathy.  Can have macular drusen without macular degeneration he was told.  Nuclear cataracts are there, but not bothersome to him.    No changes with his knee/leg.  It's the same so no f/u with ortho has been necessary. Not having constant pain or anything.  Gets pain if makes a sudden move or stands on it funny.  OSA:  On CPAP now.  He notices the warm humidity is helping his congestion he once had in the morning.  occasionally the tube pulls out if he moves the wrong way.  Saw NP at pulmonology in August.  Wearing it now between 4-5 hrs.  She advised for 6 hrs.  He reports never really being sleepy to start with.    BP is at goal.    He's on a shingrix waiting list for the first shot.    Reviewed his two polyps last cscope in 2015 so now for repeat in 02/2019.    Notes pressure in ears.  Uses zyrtec, flonase and mucinex.  Notes due to moldy, wet fall, allergies were worse.    Past Medical History:  Diagnosis Date  . Diabetes mellitus without complication (HCC)    Type 2  . H/O lymph node excision [place] , [place]  . Hypertension   . Viral pneumonia 1965    Past Surgical  History:  Procedure Laterality Date  . SKIN BIOPSY Right 05/21/15   forearm Robyne Askew PA    No Known Allergies  Outpatient Encounter Medications as of 05/26/2017  Medication Sig  . aspirin 81 MG tablet Take 81 mg by mouth daily.  Marland Kitchen atorvastatin (LIPITOR) 20 MG tablet TAKE 1 TABLET BY MOUTH ONCE DAILY 6 IN THE EVENING  . benazepril-hydrochlorthiazide (LOTENSIN HCT) 20-12.5 MG tablet Take 1 tablet by mouth daily.  . cetirizine (ZYRTEC) 10 MG tablet Take 10 mg by mouth as needed.   . fluticasone (FLONASE) 50 MCG/ACT nasal spray USE 2 SPRAY(S) IN EACH NOSTRIL ONCE DAILY  . guaiFENesin (MUCINEX) 600 MG 12 hr tablet Take 600 mg by mouth 2 (two) times daily.  . metFORMIN (GLUCOPHAGE) 1000 MG tablet Take 1 tablet (1,000 mg total) by mouth 2 (two) times daily.  . psyllium (REGULOID) 0.52 G capsule Take 0.52 g by mouth daily.  . TRADJENTA 5 MG TABS tablet TAKE ONE TABLET BY MOUTH ONCE DAILY  . TRULICITY 3.32 RJ/1.8AC SOPN INJECT 1 PEN  SUBCUTANEOUSLY ONCE A WEEK   No facility-administered encounter medications on file as of 05/26/2017.     Review of Systems:  Review of Systems  Constitutional:  Negative for chills, fever and malaise/fatigue.  HENT: Negative for congestion and hearing loss.   Eyes: Negative for blurred vision.       Glasses  Respiratory: Negative for shortness of breath.   Cardiovascular: Negative for chest pain, palpitations and leg swelling.  Gastrointestinal: Negative for abdominal pain, blood in stool, constipation and melena.  Genitourinary: Negative for dysuria.  Musculoskeletal: Positive for joint pain. Negative for falls.  Skin: Negative for itching and rash.  Neurological: Negative for dizziness, loss of consciousness and weakness.  Endo/Heme/Allergies: Does not bruise/bleed easily.  Psychiatric/Behavioral: Negative for depression and memory loss.    Health Maintenance  Topic Date Due  . TETANUS/TDAP  01/19/2017  . FOOT EXAM  05/07/2017  . HEMOGLOBIN A1C   11/17/2017  . OPHTHALMOLOGY EXAM  04/19/2018  . COLONOSCOPY  02/22/2024  . PNEUMOCOCCAL POLYSACCHARIDE VACCINE  Completed  . INFLUENZA VACCINE  Completed  . Hepatitis C Screening  Completed  . HIV Screening  Addressed    Physical Exam: Vitals:   05/26/17 0834  BP: 130/70  Pulse: 77  Temp: 98.8 F (37.1 C)  TempSrc: Oral  SpO2: 97%  Weight: 220 lb (99.8 kg)   Body mass index is 33.45 kg/m. Physical Exam  Constitutional: He is oriented to person, place, and time. He appears well-developed and well-nourished. No distress.  HENT:  Head: Normocephalic and atraumatic.  Right Ear: External ear normal.  Left Ear: External ear normal.  Nose: Nose normal.  Mouth/Throat: Oropharynx is clear and moist. No oropharyngeal exudate.  Some fluid behind TMs  Eyes: Conjunctivae are normal.  Cardiovascular: Normal rate, regular rhythm, normal heart sounds and intact distal pulses.  Pulmonary/Chest: Effort normal and breath sounds normal. No respiratory distress.  Abdominal: Bowel sounds are normal. He exhibits no distension.  Musculoskeletal: Normal range of motion.  Neurological: He is alert and oriented to person, place, and time.  Skin: Skin is warm and dry.  Psychiatric: He has a normal mood and affect.    Labs reviewed: Basic Metabolic Panel: Recent Labs    09/08/16 0804 01/13/17 0817 05/20/17 0813  NA 140 139 138  K 3.9 3.8 4.2  CL 107 103 103  CO2 25 20 27   GLUCOSE 155* 121* 147*  BUN 11 15 13   CREATININE 0.86 0.97 0.86  CALCIUM 8.7 8.7 8.8   Liver Function Tests: Recent Labs    09/08/16 0804 01/13/17 0817  AST 18 17  ALT 18 17  ALKPHOS 57 54  BILITOT 1.2 1.3*  PROT 6.1 6.1  ALBUMIN 4.1 4.1   No results for input(s): LIPASE, AMYLASE in the last 8760 hours. No results for input(s): AMMONIA in the last 8760 hours. CBC: Recent Labs    01/13/17 0817 05/20/17 0813  WBC 4.4 5.3  NEUTROABS 2,640 3,445  HGB 13.8 14.1  HCT 40.7 40.6  MCV 90.8 87.1  PLT 249  256   Lipid Panel: Recent Labs    09/08/16 0804 01/13/17 0817  CHOL 87 106  HDL 40* 40*  LDLCALC 26 52  TRIG 103 69  CHOLHDL 2.2 2.7   Lab Results  Component Value Date   HGBA1C 7.0 (H) 05/20/2017    Assessment/Plan 1. Controlled type 2 diabetes mellitus without complication, without long-term current use of insulin (HCC) -still controlled, but worse than it's been at 7 after halloween candy and thanksgiving, says he'll be back down next time - CBC with Differential/Platelet; Future - Hemoglobin A1c; Future  2. Hyperlipidemia associated with type 2 diabetes mellitus ([place]) -  cont statin therapy, work on diet and exercise - Lipid panel; Future  3. Essential hypertension - bp at goal, cont same - CBC with Differential/Platelet; Future - Basic metabolic panel; Future  4. Tear of medial meniscus of right knee, unspecified tear type, unspecified whether old or current tear, subsequent encounter -stable, not bothersome often enough to intervene  5. Class 1 obesity with serious comorbidity and body mass index (BMI) of 32.0 to 32.9 in adult, unspecified obesity type -ongoing, weight up 3 lbs since last time  6. Body mass index (BMI) of 34.0-34.9 in adult -work on diet and exercise  7.  Mild obstructive sleep apnea -try to up CPAP to 6 hrs of use as recommended at pulmonary, keep f/u in feb, does ok except if he rolls over and it becomes detached, humidity helping congestion  Labs/tests ordered:   Orders Placed This Encounter  Procedures  . CBC with Differential/Platelet    Standing Status:   Future    Standing Expiration Date:   01/24/2018  . Basic metabolic panel    Standing Status:   Future    Standing Expiration Date:   01/24/2018    Order Specific Question:   Has the patient fasted?    Answer:   Yes  . Hemoglobin A1c    Standing Status:   Future    Standing Expiration Date:   01/24/2018  . Lipid panel    Standing Status:   Future    Standing Expiration Date:    01/24/2018    Order Specific Question:   Has the patient fasted?    Answer:   Yes    Next appt:  09/19/2017 med mgt, labs before  [place] Keith Cancio, D.O. [facility] [address], [place] [address] Cell Phone (Mon-Fri 8am-5pm):  [phone] On Call:  [phone] & follow prompts after 5pm & weekends Office Phone:  [phone] Office Fax:  [phone]

## 2017-07-02 ENCOUNTER — Other Ambulatory Visit: Payer: Self-pay | Admitting: Internal Medicine

## 2017-07-25 NOTE — Telephone Encounter (Signed)
Noted...cdavis  °

## 2017-07-30 ENCOUNTER — Other Ambulatory Visit: Payer: Self-pay | Admitting: Internal Medicine

## 2017-07-30 DIAGNOSIS — E785 Hyperlipidemia, unspecified: Principal | ICD-10-CM

## 2017-07-30 DIAGNOSIS — E1169 Type 2 diabetes mellitus with other specified complication: Secondary | ICD-10-CM

## 2017-08-15 ENCOUNTER — Encounter: Payer: Self-pay | Admitting: Pulmonary Disease

## 2017-08-15 ENCOUNTER — Ambulatory Visit: Payer: PRIVATE HEALTH INSURANCE | Admitting: Pulmonary Disease

## 2017-08-15 VITALS — BP 132/76 | HR 90 | Ht 68.0 in | Wt 219.0 lb

## 2017-08-15 DIAGNOSIS — Z9989 Dependence on other enabling machines and devices: Secondary | ICD-10-CM | POA: Diagnosis not present

## 2017-08-15 DIAGNOSIS — G4733 Obstructive sleep apnea (adult) (pediatric): Secondary | ICD-10-CM

## 2017-08-15 NOTE — Patient Instructions (Signed)
Will have your CPAP changed to pressure range of 10 to 18 cm H2O  Call or email if this change is uncomfortable  Follow up in 1 year

## 2017-08-15 NOTE — Progress Notes (Signed)
[facility]  Chief Complaint  Patient presents with  . Follow-up    Pt is doing well overall with cpap machine    Vital signs: BP 132/76 (BP Location: Left Arm, Cuff Size: Normal)   Pulse 90   Ht 5\' 8"  (1.727 m)   Wt 219 lb (99.3 kg)   SpO2 96%   BMI 33.30 kg/m   History of Present Illness: [name] is a 62 y.o. male with obstructive sleep apnea.  He has been doing well with CPAP.  He sleeps for about 6 hours per night.  He sleeps with CPAP for about 4 hours, and then wakes up.  He isn't sure why he wakes up.  He then takes off his mask and sleeps for another 2 hours.  He isn't having snoring.  He doesn't nap during the day.  Physical Exam:  General - pleasant Eyes - pupils reactive, wears glasses ENT - no sinus tenderness, no oral exudate, no LAN Cardiac - regular, no murmur Chest - no wheeze, rales Abd - soft, non tender Ext - no edema Skin - no rashes Neuro - normal strength Psych - normal mood  Assessment/Plan:  Obstructive sleep apnea - he is compliant with CPAP and reports benefit - advised him to try using CPAP for entire time he is asleep - I am concerned he might need higher pressure range during REM sleep - will change him to auto CPAP range 10 to 18 cm H2O   Patient Instructions  Will have your CPAP changed to pressure range of 10 to 18 cm H2O  Call or email if this change is uncomfortable  Follow up in 1 year    Chesley Mires, MD [facility] 08/15/2017, 4:32 PM Pager:  [phone]  Flow Sheet  Sleep tests: PSG 03/03/16 >> AHI 13.4, SaO2 low 85% Auto CPAP 07/12/16 to 08/10/16 >> used on 30 of 30 nights with average 4 hrs 12 min.  Average AHI 1.6 with median CPAP 14 and 95 th percentile CPAP 15 cm H2O  Past Medical History: He  has a past medical history of Diabetes mellitus without complication ([place]), H/O lymph node excision (1998), Hypertension, and Viral pneumonia (1965).  Past  Surgical History: He  has a past surgical history that includes Skin biopsy (Right, 05/21/15).  Family History: His family history includes Diabetes (age of onset: 32) in his mother; Heart disease (age of onset: 40) in his father; Hypertension in his mother.  Social History: He  reports that  has never smoked. he has never used smokeless tobacco. He reports that he drinks about 0.6 oz of alcohol per week. He reports that he does not use drugs.  Medications: Allergies as of 08/15/2017   No Known Allergies     Medication List        Accurate as of 08/15/17  4:32 PM. Always use your most recent med list.          aspirin 81 MG tablet Take 81 mg by mouth daily.   atorvastatin 20 MG tablet Commonly known as:  LIPITOR TAKE 1 TABLET BY MOUTH ONCE DAILY 6 IN THE EVENING   benazepril-hydrochlorthiazide 20-12.5 MG tablet Commonly known as:  LOTENSIN HCT Take 1 tablet by mouth daily.   cetirizine 10 MG tablet Commonly known as:  ZYRTEC Take 10 mg by mouth as needed.   fluticasone 50 MCG/ACT nasal spray Commonly known as:  FLONASE USE 2 SPRAY(S) IN EACH NOSTRIL ONCE DAILY  metFORMIN 1000 MG tablet Commonly known as:  GLUCOPHAGE TAKE ONE TABLET BY MOUTH TWICE DAILY   MUCINEX 600 MG 12 hr tablet Generic drug:  guaiFENesin Take 600 mg by mouth 2 (two) times daily.   psyllium 0.52 g capsule Commonly known as:  REGULOID Take 0.52 g by mouth daily.   TRADJENTA 5 MG Tabs tablet Generic drug:  linagliptin TAKE ONE TABLET BY MOUTH ONCE DAILY   TRULICITY 1.51 VO/1.6WV Sopn Generic drug:  Dulaglutide INJECT 1 PEN  SUBCUTANEOUSLY ONCE A WEEK

## 2017-08-20 ENCOUNTER — Other Ambulatory Visit: Payer: Self-pay | Admitting: Internal Medicine

## 2017-09-15 ENCOUNTER — Other Ambulatory Visit: Payer: PRIVATE HEALTH INSURANCE

## 2017-09-15 DIAGNOSIS — E1169 Type 2 diabetes mellitus with other specified complication: Secondary | ICD-10-CM

## 2017-09-15 DIAGNOSIS — E119 Type 2 diabetes mellitus without complications: Secondary | ICD-10-CM

## 2017-09-15 DIAGNOSIS — I10 Essential (primary) hypertension: Secondary | ICD-10-CM

## 2017-09-15 DIAGNOSIS — E785 Hyperlipidemia, unspecified: Principal | ICD-10-CM

## 2017-09-16 LAB — CBC WITH DIFFERENTIAL/PLATELET
Basophils Absolute: 40 cells/uL (ref 0–200)
Basophils Relative: 0.6 %
Eosinophils Absolute: 53 cells/uL (ref 15–500)
Eosinophils Relative: 0.8 %
HCT: 42.9 % (ref 38.5–50.0)
Hemoglobin: 15 g/dL (ref 13.2–17.1)
Lymphs Abs: 1076 cells/uL (ref 850–3900)
MCH: 30.5 pg (ref 27.0–33.0)
MCHC: 35 g/dL (ref 32.0–36.0)
MCV: 87.4 fL (ref 80.0–100.0)
MPV: 9.2 fL (ref 7.5–12.5)
Monocytes Relative: 6.7 %
Neutro Abs: 4990 cells/uL (ref 1500–7800)
Neutrophils Relative %: 75.6 %
Platelets: 262 10*3/uL (ref 140–400)
RBC: 4.91 10*6/uL (ref 4.20–5.80)
RDW: 12.8 % (ref 11.0–15.0)
Total Lymphocyte: 16.3 %
WBC mixed population: 442 cells/uL (ref 200–950)
WBC: 6.6 10*3/uL (ref 3.8–10.8)

## 2017-09-16 LAB — LIPID PANEL
Cholesterol: 106 mg/dL (ref <200)
HDL: 41 mg/dL (ref >40)
LDL Cholesterol (Calc): 46 mg/dL (calc)
Non-HDL Cholesterol (Calc): 65 mg/dL (calc) (ref <130)
Total CHOL/HDL Ratio: 2.6 (calc) (ref <5.0)
Triglycerides: 108 mg/dL (ref <150)

## 2017-09-16 LAB — BASIC METABOLIC PANEL
BUN: 15 mg/dL (ref 7–25)
CO2: 24 mmol/L (ref 20–32)
Calcium: 9.2 mg/dL (ref 8.6–10.3)
Chloride: 107 mmol/L (ref 98–110)
Creat: 0.99 mg/dL (ref 0.70–1.25)
Glucose, Bld: 175 mg/dL — ABNORMAL HIGH (ref 65–99)
Potassium: 3.7 mmol/L (ref 3.5–5.3)
Sodium: 140 mmol/L (ref 135–146)

## 2017-09-16 LAB — HEMOGLOBIN A1C
Hgb A1c MFr Bld: 7.1 % of total Hgb — ABNORMAL HIGH (ref <5.7)
Mean Plasma Glucose: 157 (calc)
eAG (mmol/L): 8.7 (calc)

## 2017-09-19 ENCOUNTER — Ambulatory Visit: Payer: PRIVATE HEALTH INSURANCE | Admitting: Internal Medicine

## 2017-09-19 ENCOUNTER — Encounter: Payer: Self-pay | Admitting: Internal Medicine

## 2017-09-19 VITALS — BP 138/80 | HR 79 | Temp 98.7°F | Ht 68.0 in | Wt 221.0 lb

## 2017-09-19 DIAGNOSIS — G4733 Obstructive sleep apnea (adult) (pediatric): Secondary | ICD-10-CM

## 2017-09-19 DIAGNOSIS — I10 Essential (primary) hypertension: Secondary | ICD-10-CM

## 2017-09-19 DIAGNOSIS — Z6832 Body mass index (BMI) 32.0-32.9, adult: Secondary | ICD-10-CM

## 2017-09-19 DIAGNOSIS — Z23 Encounter for immunization: Secondary | ICD-10-CM

## 2017-09-19 DIAGNOSIS — E1169 Type 2 diabetes mellitus with other specified complication: Secondary | ICD-10-CM

## 2017-09-19 DIAGNOSIS — E785 Hyperlipidemia, unspecified: Secondary | ICD-10-CM | POA: Diagnosis not present

## 2017-09-19 DIAGNOSIS — E119 Type 2 diabetes mellitus without complications: Secondary | ICD-10-CM

## 2017-09-19 DIAGNOSIS — S83241D Other tear of medial meniscus, current injury, right knee, subsequent encounter: Secondary | ICD-10-CM

## 2017-09-19 MED ORDER — METFORMIN HCL 1000 MG PO TABS: ORAL_TABLET | ORAL | 5 refills | Status: DC

## 2017-09-19 NOTE — Progress Notes (Signed)
Location:  [facility] clinic Provider:  Tiffany L. Mariea Clonts, D.O., C.M.D.  Code Status: Full Code  Goals of Care:  Advanced Directives 01/17/2017  Does Patient Have a Medical Advance Directive? No  Copy of Healthcare Power of Attorney in Chart? -  Would patient like information on creating a medical advance directive? No - Patient declined     Chief Complaint  Patient presents with  . Medical Management of Chronic Issues    29mh follow-up    HPI: Patient is a 62y.o. male seen today for medical management of chronic diseases.  Well dressed and groomed today at visit. He reports nothing new and that he is feeling well overall.  A1c: Has gone up to 7.1% from 7.0% and last July was 6.6%. He is trying to walk several days a week, about a half hour each time. This has decreased some over the winter. Reports taking his medications well. Nothing in his diet has changed, but does eat carb snacks twice daily and bread at most lunches.  HLD: On lipitor, labs are good. Reports no trouble with the reduction in lipitor from last summer.   HTN: On lotensin, doing well. SBP is slightly elevated today, but he reports doing well with medication.   OSA: Pressure changed per Dr SHalford Chessman  CPAP now changed to pressure range of 10 to 18 cm H2O back in Feb. Will follow up in one year.   Knee pain: He continuous to have some mild intermittent knee pain with the Right knee when he is up on his feet and moving a lot. Does not usually take any medication for it.   Past Medical History:  Diagnosis Date  . Diabetes mellitus without complication (HCC)    Type 2  . H/O lymph node excision [place] . Hypertension   . Viral pneumonia 1965    Past Surgical History:  Procedure Laterality Date  . SKIN BIOPSY Right 05/21/15   forearm KVida RollerSheffield PA    No Known Allergies  Outpatient Encounter Medications as of 09/19/2017  Medication Sig  . amoxicillin (AMOXIL) 875 MG tablet   . aspirin 81 MG tablet  Take 81 mg by mouth daily.  .Marland Kitchenatorvastatin (LIPITOR) 20 MG tablet TAKE 1 TABLET BY MOUTH ONCE DAILY 6 IN THE EVENING  . benazepril-hydrochlorthiazide (LOTENSIN HCT) 20-12.5 MG tablet Take 1 tablet by mouth daily.  . cetirizine (ZYRTEC) 10 MG tablet Take 10 mg by mouth as needed.   . fluticasone (FLONASE) 50 MCG/ACT nasal spray USE 2 SPRAY(S) IN EACH NOSTRIL ONCE DAILY  . guaiFENesin (MUCINEX) 600 MG 12 hr tablet Take 600 mg by mouth 2 (two) times daily.  . metFORMIN (GLUCOPHAGE) 1000 MG tablet TAKE ONE TABLET BY MOUTH TWICE DAILY  . psyllium (REGULOID) 0.52 G capsule Take 0.52 g by mouth daily.  . TRADJENTA 5 MG TABS tablet TAKE 1 TABLET BY MOUTH ONCE DAILY  . TRULICITY 04.00MQQ/7.6PPSOPN INJECT 1 PEN  SUBCUTANEOUSLY ONCE A WEEK   No facility-administered encounter medications on file as of 09/19/2017.     Review of Systems:  Review of Systems  Constitutional: Negative for chills, fever and malaise/fatigue.  HENT: Negative for hearing loss.        Hearing feels muffled at times  Eyes: Negative for blurred vision.       Glasses  Respiratory: Negative for cough and shortness of breath.   Cardiovascular: Negative for chest pain, palpitations and leg swelling.  Gastrointestinal: Negative  for blood in stool, heartburn, nausea and vomiting.  Genitourinary: Negative for frequency, hematuria and urgency.  Musculoskeletal: Negative for back pain and neck pain.  Neurological: Negative for dizziness and headaches.  Psychiatric/Behavioral: Negative for depression and memory loss. The patient is not nervous/anxious and does not have insomnia.     Health Maintenance  Topic Date Due  . TETANUS/TDAP  01/19/2017  . INFLUENZA VACCINE  01/19/2018  . HEMOGLOBIN A1C  03/18/2018  . OPHTHALMOLOGY EXAM  04/19/2018  . FOOT EXAM  05/26/2018  . COLONOSCOPY  02/22/2019  . PNEUMOCOCCAL POLYSACCHARIDE VACCINE  Completed  . Hepatitis C Screening  Completed  . HIV Screening  Addressed    Physical  Exam: Vitals:   09/19/17 0834  BP: 138/80  Pulse: 79  Temp: 98.7 F (37.1 C)  TempSrc: Oral  SpO2: 98%  Weight: 221 lb (100.2 kg)   Body mass index is 33.6 kg/m. Physical Exam  Constitutional: He is oriented to person, place, and time. He appears well-developed.  HENT:  Head: Normocephalic.  Cardiovascular: Normal rate, regular rhythm, normal heart sounds and intact distal pulses.  Pulmonary/Chest: Effort normal and breath sounds normal.  Abdominal: Soft. Bowel sounds are normal.  Musculoskeletal: Normal range of motion.  Neurological: He is alert and oriented to person, place, and time.  Skin: Skin is warm and dry. Capillary refill takes less than 2 seconds.  Psychiatric: He has a normal mood and affect. His behavior is normal. Judgment and thought content normal.  Vitals reviewed.   Labs reviewed: Basic Metabolic Panel: Recent Labs    01/13/17 0817 05/20/17 0813 09/15/17 0827  NA 139 138 140  K 3.8 4.2 3.7  CL 103 103 107  CO2 '20 27 24  '$ GLUCOSE 121* 147* 175*  BUN '15 13 15  '$ CREATININE 0.97 0.86 0.99  CALCIUM 8.7 8.8 9.2   Liver Function Tests: Recent Labs    01/13/17 0817  AST 17  ALT 17  ALKPHOS 54  BILITOT 1.3*  PROT 6.1  ALBUMIN 4.1   No results for input(s): LIPASE, AMYLASE in the last 8760 hours. No results for input(s): AMMONIA in the last 8760 hours. CBC: Recent Labs    01/13/17 0817 05/20/17 0813 09/15/17 0827  WBC 4.4 5.3 6.6  NEUTROABS 2,640 3,445 4,990  HGB 13.8 14.1 15.0  HCT 40.7 40.6 42.9  MCV 90.8 87.1 87.4  PLT 249 256 262   Lipid Panel: Recent Labs    01/13/17 0817 09/15/17 0827  CHOL 106 106  HDL 40* 41  LDLCALC 52 46  TRIG 69 108  CHOLHDL 2.7 2.6   Lab Results  Component Value Date   HGBA1C 7.1 (H) 09/15/2017    Procedures since last visit: No results found.  Assessment/Plan  1. Controlled type 2 diabetes mellitus without complication, without long-term current use of insulin ([place]) His A1c is elevated at  7.1% which has slowly increased since last July of 6.6%. His diet has not changed and he walks occasionally, but not consistently. He also reports eating more carb heavy snacks. Educated about healthy snacks that offer the crunch of crackers and pretzels, such as celery and PB and apples. Will also increase his metformin to give him an extra '500mg'$  daily with breakfast and '1000mg'$  daily with lunch and dinner. Will reassess A1c and can reduce metformin if better and if diet is reported to be better. No signs of end target damage seen on labs or exam.   - Hemoglobin A1c; Future - BMP with eGFR([facility]);  Future - metFORMIN (GLUCOPHAGE) 1000 MG tablet; Take 1 tablet (1,000 mg total) by mouth 2 (two) times daily with a meal AND 0.5 tablets (500 mg total) daily with breakfast.  Dispense: 75 tablet; Refill: 5  2. Hyperlipidemia associated with type 2 diabetes mellitus ([place]) Continue Lipitor, but encourage eating a well balanced diet.   3. Essential hypertension BP today is stable, but SBP is more elevated than desired at 138. Advised to reduce sodium intake to help with reduction of extra retention of fluid. Will assess kidneys at future labs secondary to Lotensin, to watch for function.  - BMP with eGFR([facility]); Future  4. Body mass index (BMI) of 34.0-34.9 in adult Educated about weight loss with diet and exercise.  - Hemoglobin A1c; Future  5. Mild obstructive sleep apnea Is now on CPAP with a recent changed to pressure range of 10 to 18 cm H2O, per Dr. Halford Chessman. He reports no trouble or discomfort with this new change. We appreciate the collaboration in his care.   6. Need for Tdap vaccination Due for this, provided today in office. - Tdap vaccine greater than or equal to 7yo IM  7. Tear of medial meniscus of right knee Educated about premedication of tylenol as needed for activity. As well as icing post activity. Reports pain is just intermittent at this time.   Labs/tests ordered:   Orders  Placed This Encounter  Procedures  . Hemoglobin A1c    Standing Status:   Future    Standing Expiration Date:   05/21/2018  . BMP with eGFR([facility])    Standing Status:   Future    Standing Expiration Date:   05/21/2018    Next appt:  01/26/2018   Karen Kays, RN, DNP Student Ger[facility]s [facility] [phone], [place] [address] Cell Phone (Mon-Fri 8am-5pm):  [phone] On Call:  [phone] & follow prompts after 5pm & weekends Office Phone:  [phone] Office Fax:  [phone]

## 2017-09-19 NOTE — Patient Instructions (Signed)
Increase your metformin so you are taking 500mg  (1/2 tab) with breakfast and 1000mg  with your lunch and supper.

## 2017-10-01 ENCOUNTER — Other Ambulatory Visit: Payer: Self-pay | Admitting: Internal Medicine

## 2017-10-01 DIAGNOSIS — I10 Essential (primary) hypertension: Secondary | ICD-10-CM

## 2017-10-29 ENCOUNTER — Other Ambulatory Visit: Payer: Self-pay | Admitting: Internal Medicine

## 2017-10-29 DIAGNOSIS — I10 Essential (primary) hypertension: Secondary | ICD-10-CM

## 2017-11-12 ENCOUNTER — Other Ambulatory Visit: Payer: Self-pay | Admitting: Internal Medicine

## 2017-11-19 ENCOUNTER — Other Ambulatory Visit: Payer: Self-pay | Admitting: Internal Medicine

## 2017-11-21 ENCOUNTER — Other Ambulatory Visit: Payer: Self-pay | Admitting: *Deleted

## 2018-01-23 ENCOUNTER — Other Ambulatory Visit: Payer: Self-pay | Admitting: Internal Medicine

## 2018-01-23 DIAGNOSIS — E785 Hyperlipidemia, unspecified: Secondary | ICD-10-CM

## 2018-01-23 DIAGNOSIS — E1169 Type 2 diabetes mellitus with other specified complication: Secondary | ICD-10-CM

## 2018-01-23 DIAGNOSIS — I10 Essential (primary) hypertension: Secondary | ICD-10-CM

## 2018-01-24 ENCOUNTER — Other Ambulatory Visit: Payer: PRIVATE HEALTH INSURANCE

## 2018-01-24 DIAGNOSIS — E119 Type 2 diabetes mellitus without complications: Secondary | ICD-10-CM

## 2018-01-24 DIAGNOSIS — Z6832 Body mass index (BMI) 32.0-32.9, adult: Secondary | ICD-10-CM

## 2018-01-24 DIAGNOSIS — I10 Essential (primary) hypertension: Secondary | ICD-10-CM

## 2018-01-25 LAB — HEMOGLOBIN A1C
Hgb A1c MFr Bld: 6.1 % of total Hgb — ABNORMAL HIGH (ref <5.7)
Mean Plasma Glucose: 128 (calc)
eAG (mmol/L): 7.1 (calc)

## 2018-01-25 LAB — BASIC METABOLIC PANEL WITH GFR
BUN: 13 mg/dL (ref 7–25)
CO2: 27 mmol/L (ref 20–32)
Calcium: 9 mg/dL (ref 8.6–10.3)
Chloride: 104 mmol/L (ref 98–110)
Creat: 0.85 mg/dL (ref 0.70–1.25)
GFR, Est African American: 108 mL/min/{1.73_m2} (ref >=60)
GFR, Est Non African American: 93 mL/min/{1.73_m2} (ref >=60)
Glucose, Bld: 118 mg/dL — ABNORMAL HIGH (ref 65–99)
Potassium: 3.9 mmol/L (ref 3.5–5.3)
Sodium: 139 mmol/L (ref 135–146)

## 2018-01-26 ENCOUNTER — Ambulatory Visit: Payer: PRIVATE HEALTH INSURANCE | Admitting: Internal Medicine

## 2018-01-26 ENCOUNTER — Encounter: Payer: Self-pay | Admitting: Internal Medicine

## 2018-01-26 VITALS — BP 138/80 | HR 74 | Temp 98.6°F | Ht 68.0 in | Wt 211.0 lb

## 2018-01-26 DIAGNOSIS — E1169 Type 2 diabetes mellitus with other specified complication: Secondary | ICD-10-CM

## 2018-01-26 DIAGNOSIS — E6609 Other obesity due to excess calories: Secondary | ICD-10-CM | POA: Diagnosis not present

## 2018-01-26 DIAGNOSIS — Z6832 Body mass index (BMI) 32.0-32.9, adult: Secondary | ICD-10-CM

## 2018-01-26 DIAGNOSIS — G4733 Obstructive sleep apnea (adult) (pediatric): Secondary | ICD-10-CM

## 2018-01-26 DIAGNOSIS — E785 Hyperlipidemia, unspecified: Secondary | ICD-10-CM

## 2018-01-26 DIAGNOSIS — E119 Type 2 diabetes mellitus without complications: Secondary | ICD-10-CM

## 2018-01-26 DIAGNOSIS — Z0000 Encounter for general adult medical examination without abnormal findings: Secondary | ICD-10-CM | POA: Diagnosis not present

## 2018-01-26 DIAGNOSIS — I10 Essential (primary) hypertension: Secondary | ICD-10-CM | POA: Diagnosis not present

## 2018-01-26 NOTE — Progress Notes (Signed)
Provider:  Rexene Edison. Mariea Clonts, D.O., C.M.D. Location:   [facility]  Place of Service:   clinic  Previous PCP: Gayland Curry, DO Patient Care Team: Gayland Curry, DO as PCP - General (Geriatric Medicine) Lavonna Monarch, MD as Consulting Physician (Dermatology) Leandrew Koyanagi, MD as Attending Physician (Orthopedic Surgery) Roel Cluck, MD as Referring Physician (Ophthalmology) Deneise Lever, MD as Consulting Physician (Pulmonary Disease)  Extended Emergency Contact Information Primary Emergency Contact: Zea,Kathy Address: [address]          [place], [place] [address] Johnnette Litter of [place] Phone: [phone] Relation: Spouse  Code Status:full code Goals of Care: Advanced Directive information Advanced Directives 01/26/2018  Does Patient Have a Medical Advance Directive? No  Copy of Healthcare Power of Attorney in Chart? -  Would patient like information on creating a medical advance directive? No - Patient declined   Chief Complaint  Patient presents with  . Annual Exam    CPE    HPI: Patient is a 62 y.o. male seen today for an annual physical exam.  EKG at 22 with RBBB and LAFB is unchanged from July of last year.  It's scanned in 2000 b/c ekg machine was printed incorrectly.    Feels good.    He went to dermatology July 31st--[facility].  Took a biopsy on his left chin and his scalp.  No cancer.  He was not told what they were.  They also froze a spot in front of his left ear.  He's going to do the PDT light treatment Nov 7th as recommended.  He continues to get these spots.  Had his full skin exam.    He had a tick under his leg in June--went to urgent care--was cleaned out and gave him doxycycline for 5 days as a precaution--had been there embedded for 24 hrs.  Bump persists.  Had two bugbites under right arm--one was a tick.  Using a cream and it's gone away.  The one was nasty looking and inflamed. He did not have erythema migrans.  [facility] did  a fingerstick June 21st at his employer.  LDL was 41 on his fingerstick there.  He met with their pharmacist in July and meets with the nutritionist in [place].  hba1c is 6.1.  Is on '2500mg'$  of metformin rather than '2000mg'$  and they questioned that.  He has also controlled his snacks.  Fasting glucose was 118 which is an improvement.  He notes he needs to think more about what he's eating.  He continues with his one banana at breakfast.  They suggested adding a protein.  He was concerned about the carbs of the oatmeal.  He thinks he's going to go back to a low fat buttermilk cup.  Talked about low sugar greek yogurt.    He knows to watch his salt.  BP at work manually was 142/88 July 16th.  Initially it was higher with the auto cuff.  Discussed goal less than 130/80.    Had two different polyps on his cscope in 2015 in [facility] after he had bleeding. Also had diverticulosis and internal hemorrhoids.  Next is 02/2019.  He's good with going to  GI next time.   Past Medical History:  Diagnosis Date  . Diabetes mellitus without complication (HCC)    Type 2  . H/O lymph node excision [place] , [place]  . Hypertension   . Viral pneumonia 1965   Past Surgical History:  Procedure  Laterality Date  . SKIN BIOPSY Right 05/21/15   forearm Robyne Askew PA    reports that he has never smoked. He has never used smokeless tobacco. He reports that he drinks about 1.0 standard drinks of alcohol per week. He reports that he does not use drugs.  Functional Status Survey:    Family History  Problem Relation Age of Onset  . Hypertension Mother   . Diabetes Mother 14       Diabetes type 2  . Heart disease Father 3       Heart Attack    Health Maintenance  Topic Date Due  . INFLUENZA VACCINE  01/19/2018  . OPHTHALMOLOGY EXAM  04/19/2018  . FOOT EXAM  05/26/2018  . HEMOGLOBIN A1C  07/27/2018  . COLONOSCOPY  02/22/2019  . TETANUS/TDAP  09/20/2027  . PNEUMOCOCCAL POLYSACCHARIDE VACCINE   Completed  . Hepatitis C Screening  Completed  . HIV Screening  Addressed    No Known Allergies  Outpatient Encounter Medications as of 01/26/2018  Medication Sig  . aspirin 81 MG tablet Take 81 mg by mouth daily.  Marland Kitchen atorvastatin (LIPITOR) 20 MG tablet TAKE 1 TABLET BY MOUTH ONCE DAILY 6 IN THE EVENING  . benazepril-hydrochlorthiazide (LOTENSIN HCT) 20-12.5 MG tablet TAKE 1 TABLET BY MOUTH ONCE DAILY  . cetirizine (ZYRTEC) 10 MG tablet Take 10 mg by mouth as needed.   . fluticasone (FLONASE) 50 MCG/ACT nasal spray USE 2 SPRAY(S) IN EACH NOSTRIL ONCE DAILY  . guaiFENesin (MUCINEX) 600 MG 12 hr tablet Take 600 mg by mouth 2 (two) times daily.  . metFORMIN (GLUCOPHAGE) 1000 MG tablet Take 1 tablet (1,000 mg total) by mouth 2 (two) times daily with a meal AND 0.5 tablets (500 mg total) daily with breakfast.  . psyllium (REGULOID) 0.52 G capsule Take 0.52 g by mouth daily.  . TRADJENTA 5 MG TABS tablet TAKE 1 TABLET BY MOUTH ONCE DAILY  . TRULICITY 0.97 DZ/3.2DJ SOPN INJECT 1 PEN SUBCUTANEOUSLY ONCE A WEEK  . [DISCONTINUED] amoxicillin (AMOXIL) 875 MG tablet    No facility-administered encounter medications on file as of 01/26/2018.     Review of Systems  Constitutional: Negative for chills, fever and malaise/fatigue.  HENT: Negative for hearing loss.   Eyes: Negative for blurred vision.  Respiratory: Negative for cough and shortness of breath.   Cardiovascular: Negative for chest pain, palpitations and leg swelling.  Gastrointestinal: Negative for abdominal pain, blood in stool, constipation, diarrhea and melena.       Some loose stools day after trulicity  Genitourinary: Negative for dysuria.  Musculoskeletal: Positive for joint pain. Negative for falls.       Right knee  Skin: Negative for itching and rash.       Precancerous skin lesions keep showing up--sees derm  Neurological: Negative for dizziness and loss of consciousness.  Endo/Heme/Allergies: Does not bruise/bleed easily.    Psychiatric/Behavioral: Negative for depression and memory loss. The patient is not nervous/anxious and does not have insomnia.     Vitals:   01/26/18 0908  BP: 138/80  Pulse: 74  Temp: 98.6 F (37 C)  TempSrc: Oral  SpO2: 96%  Weight: 211 lb (95.7 kg)  Height: '5\' 8"'$  (1.727 m)   Body mass index is 32.08 kg/m. Physical Exam  Constitutional: He is oriented to person, place, and time. He appears well-developed and well-nourished. No distress.  HENT:  Head: Normocephalic and atraumatic.  Right Ear: External ear normal.  Left Ear: External ear normal.  Nose:  Nose normal.  Mouth/Throat: Oropharynx is clear and moist. No oropharyngeal exudate.  Eyes: Pupils are equal, round, and reactive to light. Conjunctivae and EOM are normal.  Neck: Normal range of motion. Neck supple. No JVD present. No tracheal deviation present. No thyromegaly present.  Cardiovascular: Normal rate, regular rhythm, normal heart sounds and intact distal pulses.  Pulmonary/Chest: Effort normal and breath sounds normal. No respiratory distress.  Abdominal: Soft. Bowel sounds are normal. He exhibits no distension and no mass. There is no tenderness. There is no rebound and no guarding.  Musculoskeletal: Normal range of motion. He exhibits no tenderness.  Lymphadenopathy:    He has no cervical adenopathy.  Neurological: He is alert and oriented to person, place, and time. He displays normal reflexes. No cranial nerve deficit. Coordination normal.  Skin: Skin is warm and dry. Capillary refill takes less than 2 seconds.  Psychiatric: He has a normal mood and affect. His behavior is normal. Judgment and thought content normal.    Labs reviewed: Basic Metabolic Panel: Recent Labs    05/20/17 0813 09/15/17 0827 01/24/18 0830  NA 138 140 139  K 4.2 3.7 3.9  CL 103 107 104  CO2 '27 24 27  '$ GLUCOSE 147* 175* 118*  BUN '13 15 13  '$ CREATININE 0.86 0.99 0.85  CALCIUM 8.8 9.2 9.0   Liver Function Tests: No results  for input(s): AST, ALT, ALKPHOS, BILITOT, PROT, ALBUMIN in the last 8760 hours. No results for input(s): LIPASE, AMYLASE in the last 8760 hours. No results for input(s): AMMONIA in the last 8760 hours. CBC: Recent Labs    05/20/17 0813 09/15/17 0827  WBC 5.3 6.6  NEUTROABS 3,445 4,990  HGB 14.1 15.0  HCT 40.6 42.9  MCV 87.1 87.4  PLT 256 262   Lab Results  Component Value Date   HGBA1C 6.1 (H) 01/24/2018   Assessment/Plan 1. Essential hypertension - bp sometimes running high--he's going to check again before his visit and bring me the readings to see if adjustments must be made and decrease sodium intake - EKG 12-Lead as in hpi was unchanged - CBC with Differential/Platelet; Future - COMPLETE METABOLIC PANEL WITH GFR; Future  2. Body mass index (BMI) of 32.0-32.9 in adult - cont to work on diet, exercise as able with right knee injury - Lipid panel; Future  3. Class 1 obesity due to excess calories with serious comorbidity and body mass index (BMI) of 32.0 to 32.9 in adult - cont as above, has diabetes and hyperlipidemia at goal - Lipid panel; Future  4. Hyperlipidemia associated with type 2 diabetes mellitus ([place]) - at goal, cont statin therapy and dietary changes - Lipid panel; Future  5. Annual physical exam -performed today - Hemoglobin A1c; Future - Lipid panel; Future - CBC with Differential/Platelet; Future - COMPLETE METABOLIC PANEL WITH GFR; Future  7. Controlled type 2 diabetes mellitus without complication, without long-term current use of insulin ([place]) - well controlled at this time with trulicity, tradjenta, and high dose metformin, cont asa and statin - Hemoglobin A1c; Future - Lipid panel; Future - COMPLETE METABOLIC PANEL WITH GFR; Future   Labs/tests ordered:   Orders Placed This Encounter  Procedures  . Hemoglobin A1c    Standing Status:   Future    Standing Expiration Date:   09/27/2018  . Lipid panel    Standing Status:   Future     Standing Expiration Date:   09/27/2018  . CBC with Differential/Platelet    Standing Status:  Future    Standing Expiration Date:   09/27/2018  . COMPLETE METABOLIC PANEL WITH GFR    Standing Status:   Future    Standing Expiration Date:   09/27/2018  . EKG 12-Lead   F/u 4 mos med mgt, fasting labs before  Ladislaus Repsher L. Asmi Fugere, D.O. [facility] [address],  [address] Cell Phone (Mon-Fri 8am-5pm):  [phone] On Call:  [phone] & follow prompts after 5pm & weekends Office Phone:  [phone] Office Fax:  [phone]

## 2018-01-26 NOTE — Patient Instructions (Addendum)
Check bp daily for a week before your next visit.   Fat and Cholesterol Restricted Diet Getting too much fat and cholesterol in your diet may cause health problems. Following this diet helps keep your fat and cholesterol at normal levels. This can keep you from getting sick. What types of fat should I choose?  Choose monosaturated and polyunsaturated fats. These are found in foods such as olive oil, canola oil, flaxseeds, walnuts, almonds, and seeds.  Eat more omega-3 fats. Good choices include salmon, mackerel, sardines, tuna, flaxseed oil, and ground flaxseeds.  Limit saturated fats. These are in animal products such as meats, butter, and cream. They can also be in plant products such as palm oil, palm kernel oil, and coconut oil.  Avoid foods with partially hydrogenated oils in them. These contain trans fats. Examples of foods that have trans fats are stick margarine, some tub margarines, cookies, crackers, and other baked goods. What general guidelines do I need to follow?  Check food labels. Look for the words "trans fat" and "saturated fat."  When preparing a meal: ? Fill half of your plate with vegetables and green salads. ? Fill one fourth of your plate with whole grains. Look for the word "whole" as the first word in the ingredient list. ? Fill one fourth of your plate with lean protein foods.  Eat more foods that have fiber, like apples, carrots, beans, peas, and barley.  Eat more home-cooked foods. Eat less at restaurants and buffets.  Limit or avoid alcohol.  Limit foods high in starch and sugar.  Limit fried foods.  Cook foods without frying them. Baking, boiling, grilling, and broiling are all great options.  Lose weight if you are overweight. Losing even a small amount of weight can help your overall health. It can also help prevent diseases such as diabetes and heart disease. What foods can I eat? Grains Whole grains, such as whole wheat or whole grain breads,  crackers, cereals, and pasta. Unsweetened oatmeal, bulgur, barley, quinoa, or brown rice. Corn or whole wheat flour tortillas. Vegetables Fresh or frozen vegetables (raw, steamed, roasted, or grilled). Green salads. Fruits All fresh, canned (in natural juice), or frozen fruits. Meat and Other Protein Products Ground beef (85% or leaner), grass-fed beef, or beef trimmed of fat. Skinless chicken or [place]. Ground chicken or [place]. Pork trimmed of fat. All fish and seafood. Eggs. Dried beans, peas, or lentils. Unsalted nuts or seeds. Unsalted canned or dry beans. Dairy Low-fat dairy products, such as skim or 1% milk, 2% or reduced-fat cheeses, low-fat ricotta or cottage cheese, or plain low-fat yogurt. Fats and Oils Tub margarines without trans fats. Light or reduced-fat mayonnaise and salad dressings. Avocado. Olive, canola, sesame, or safflower oils. Natural peanut or almond butter (choose ones without added sugar and oil). The items listed above may not be a complete list of recommended foods or beverages. Contact your dietitian for more options. What foods are not recommended? Grains White bread. White pasta. White rice. Cornbread. Bagels, pastries, and croissants. Crackers that contain trans fat. Vegetables White potatoes. Corn. Creamed or fried vegetables. Vegetables in a cheese sauce. Fruits Dried fruits. Canned fruit in light or heavy syrup. Fruit juice. Meat and Other Protein Products Fatty cuts of meat. Ribs, chicken wings, bacon, sausage, bologna, salami, chitterlings, fatback, hot dogs, bratwurst, and packaged luncheon meats. Liver and organ meats. Dairy Whole or 2% milk, cream, half-and-half, and cream cheese. Whole milk cheeses. Whole-fat or sweetened yogurt. Full-fat cheeses. Nondairy creamers and whipped  toppings. Processed cheese, cheese spreads, or cheese curds. Sweets and Desserts Corn syrup, sugars, honey, and molasses. Candy. Jam and jelly. Syrup. Sweetened cereals.  Cookies, pies, cakes, donuts, muffins, and ice cream. Fats and Oils Butter, stick margarine, lard, shortening, ghee, or bacon fat. Coconut, palm kernel, or palm oils. Beverages Alcohol. Sweetened drinks (such as sodas, lemonade, and fruit drinks or punches). The items listed above may not be a complete list of foods and beverages to avoid. Contact your dietitian for more information. This information is not intended to replace advice given to you by your health care provider. Make sure you discuss any questions you have with your health care provider. Document Released: 12/07/2011 Document Revised: 02/12/2016 Document Reviewed: 09/06/2013 Elsevier Interactive Patient Education  [employer].

## 2018-03-25 ENCOUNTER — Other Ambulatory Visit: Payer: Self-pay | Admitting: Internal Medicine

## 2018-03-25 DIAGNOSIS — E119 Type 2 diabetes mellitus without complications: Secondary | ICD-10-CM

## 2018-04-20 LAB — HM DIABETES EYE EXAM

## 2018-04-21 ENCOUNTER — Encounter: Payer: Self-pay | Admitting: Internal Medicine

## 2018-05-06 ENCOUNTER — Other Ambulatory Visit: Payer: Self-pay | Admitting: Internal Medicine

## 2018-05-13 ENCOUNTER — Other Ambulatory Visit: Payer: Self-pay | Admitting: Internal Medicine

## 2018-05-29 ENCOUNTER — Other Ambulatory Visit: Payer: PRIVATE HEALTH INSURANCE

## 2018-05-29 DIAGNOSIS — E66811 Obesity, class 1: Secondary | ICD-10-CM

## 2018-05-29 DIAGNOSIS — I10 Essential (primary) hypertension: Secondary | ICD-10-CM

## 2018-05-29 DIAGNOSIS — Z6832 Body mass index (BMI) 32.0-32.9, adult: Secondary | ICD-10-CM

## 2018-05-29 DIAGNOSIS — Z0000 Encounter for general adult medical examination without abnormal findings: Secondary | ICD-10-CM

## 2018-05-29 DIAGNOSIS — E1169 Type 2 diabetes mellitus with other specified complication: Secondary | ICD-10-CM

## 2018-05-29 DIAGNOSIS — E6609 Other obesity due to excess calories: Secondary | ICD-10-CM

## 2018-05-29 DIAGNOSIS — E119 Type 2 diabetes mellitus without complications: Secondary | ICD-10-CM

## 2018-05-29 DIAGNOSIS — E785 Hyperlipidemia, unspecified: Secondary | ICD-10-CM

## 2018-05-30 LAB — COMPLETE METABOLIC PANEL WITH GFR
AG Ratio: 1.9 (calc) (ref 1.0–2.5)
ALT: 14 U/L (ref 9–46)
AST: 17 U/L (ref 10–35)
Albumin: 4.2 g/dL (ref 3.6–5.1)
Alkaline phosphatase (APISO): 53 U/L (ref 40–115)
BUN: 13 mg/dL (ref 7–25)
CO2: 29 mmol/L (ref 20–32)
Calcium: 9 mg/dL (ref 8.6–10.3)
Chloride: 104 mmol/L (ref 98–110)
Creat: 0.93 mg/dL (ref 0.70–1.25)
GFR, Est African American: 102 mL/min/{1.73_m2} (ref >=60)
GFR, Est Non African American: 88 mL/min/{1.73_m2} (ref >=60)
Globulin: 2.2 g/dL (calc) (ref 1.9–3.7)
Glucose, Bld: 122 mg/dL — ABNORMAL HIGH (ref 65–99)
Potassium: 4.1 mmol/L (ref 3.5–5.3)
Sodium: 141 mmol/L (ref 135–146)
Total Bilirubin: 1.3 mg/dL — ABNORMAL HIGH (ref 0.2–1.2)
Total Protein: 6.4 g/dL (ref 6.1–8.1)

## 2018-05-30 LAB — CBC WITH DIFFERENTIAL/PLATELET
Basophils Absolute: 53 cells/uL (ref 0–200)
Basophils Relative: 0.9 %
Eosinophils Absolute: 71 cells/uL (ref 15–500)
Eosinophils Relative: 1.2 %
HCT: 44.1 % (ref 38.5–50.0)
Hemoglobin: 15 g/dL (ref 13.2–17.1)
Lymphs Abs: 1333 cells/uL (ref 850–3900)
MCH: 30.3 pg (ref 27.0–33.0)
MCHC: 34 g/dL (ref 32.0–36.0)
MCV: 89.1 fL (ref 80.0–100.0)
MPV: 9.1 fL (ref 7.5–12.5)
Monocytes Relative: 6.5 %
Neutro Abs: 4059 cells/uL (ref 1500–7800)
Neutrophils Relative %: 68.8 %
Platelets: 292 10*3/uL (ref 140–400)
RBC: 4.95 10*6/uL (ref 4.20–5.80)
RDW: 12.6 % (ref 11.0–15.0)
Total Lymphocyte: 22.6 %
WBC mixed population: 384 cells/uL (ref 200–950)
WBC: 5.9 10*3/uL (ref 3.8–10.8)

## 2018-05-30 LAB — LIPID PANEL
Cholesterol: 103 mg/dL (ref <200)
HDL: 45 mg/dL (ref >40)
LDL Cholesterol (Calc): 44 mg/dL (calc)
Non-HDL Cholesterol (Calc): 58 mg/dL (calc) (ref <130)
Total CHOL/HDL Ratio: 2.3 (calc) (ref <5.0)
Triglycerides: 59 mg/dL (ref <150)

## 2018-05-30 LAB — HEMOGLOBIN A1C
Hgb A1c MFr Bld: 6.2 % of total Hgb — ABNORMAL HIGH (ref <5.7)
Mean Plasma Glucose: 131 (calc)
eAG (mmol/L): 7.3 (calc)

## 2018-06-01 ENCOUNTER — Encounter: Payer: Self-pay | Admitting: Internal Medicine

## 2018-06-01 ENCOUNTER — Ambulatory Visit: Payer: PRIVATE HEALTH INSURANCE | Admitting: Internal Medicine

## 2018-06-01 VITALS — BP 120/80 | HR 72 | Temp 98.4°F | Ht 68.0 in | Wt 206.0 lb

## 2018-06-01 DIAGNOSIS — Z6831 Body mass index (BMI) 31.0-31.9, adult: Secondary | ICD-10-CM | POA: Diagnosis not present

## 2018-06-01 DIAGNOSIS — E119 Type 2 diabetes mellitus without complications: Secondary | ICD-10-CM

## 2018-06-01 DIAGNOSIS — E6609 Other obesity due to excess calories: Secondary | ICD-10-CM | POA: Diagnosis not present

## 2018-06-01 DIAGNOSIS — Z6830 Body mass index (BMI) 30.0-30.9, adult: Secondary | ICD-10-CM

## 2018-06-01 DIAGNOSIS — E785 Hyperlipidemia, unspecified: Secondary | ICD-10-CM

## 2018-06-01 DIAGNOSIS — E1169 Type 2 diabetes mellitus with other specified complication: Secondary | ICD-10-CM | POA: Diagnosis not present

## 2018-06-01 DIAGNOSIS — I10 Essential (primary) hypertension: Secondary | ICD-10-CM

## 2018-06-01 NOTE — Progress Notes (Signed)
Location:  [facility] clinic Provider:  Mycheal Veldhuizen L. Mariea Clonts, D.O., C.M.D.  Goals of Care:  Advanced Directives 01/26/2018  Does Patient Have a Medical Advance Directive? No  Copy of Healthcare Power of Attorney in Chart? -  Would patient like information on creating a medical advance directive? No - Patient declined  Has still not brought in his advance directives  Chief Complaint  Patient presents with  . Medical Management of Chronic Issues    3mth follow-up    HPI: Patient is a 62 y.o. male seen today for medical management of chronic diseases.    BP ranged at home:  120-133/75-91.     Did see Dr. Rex Kras on [facility].  No new issues.  Cataracts are not due to come off yet--not bothersome.  No retinopathy.    He is down 5 lbs.  No changes recently to his diet.  Not able to do a lot of exercise.   Has watched the carbs.  He has low fat buttermilk at breakfast to get more protein in his diet.    He went to derm in November and had the light treatment.  He goes back 1/17.  He had peeling.  No new skin cancers, but he does have a worrisome spot on his right forearm.  Had previous squamous cell there that he had before.  He had a small bruise develop after his trulicity injection most recently.  There was a small swelling where he gave it in his left abdomen, then a bruise developed--he thinks it's normal but it's ok.    Reviewed labs with him.    Past Medical History:  Diagnosis Date  . Diabetes mellitus without complication (HCC)    Type 2  . H/O lymph node excision [place] , [place]  . Hypertension   . Viral pneumonia 1965    Past Surgical History:  Procedure Laterality Date  . SKIN BIOPSY Right 05/21/15   forearm Robyne Askew PA    No Known Allergies  Outpatient Encounter Medications as of 06/01/2018  Medication Sig  . aspirin 81 MG tablet Take 81 mg by mouth daily.  Marland Kitchen atorvastatin (LIPITOR) 20 MG tablet TAKE 1 TABLET BY MOUTH ONCE DAILY 6 IN THE EVENING  .  benazepril-hydrochlorthiazide (LOTENSIN HCT) 20-12.5 MG tablet TAKE 1 TABLET BY MOUTH ONCE DAILY  . cetirizine (ZYRTEC) 10 MG tablet Take 10 mg by mouth as needed.   . fluticasone (FLONASE) 50 MCG/ACT nasal spray USE 2 SPRAY(S) IN EACH NOSTRIL ONCE DAILY  . guaiFENesin (MUCINEX) 600 MG 12 hr tablet Take 600 mg by mouth 2 (two) times daily.  . metFORMIN (GLUCOPHAGE) 1000 MG tablet TAKE 1 & 1/2 TABLETS BY MOUTH ONCE DAILY IN THE MORNING WITH BREAKFAST AND TAKE 1 TABLET WITH EVENING MEAL  . psyllium (REGULOID) 0.52 G capsule Take 0.52 g by mouth daily.  . TRADJENTA 5 MG TABS tablet TAKE 1 TABLET BY MOUTH ONCE DAILY  . TRULICITY 8.46 KZ/9.9JT SOPN INJECT 1 PEN SUBCUTANEOUSLY ONCE A WEEK   No facility-administered encounter medications on file as of 06/01/2018.     Review of Systems:  Review of Systems  Constitutional: Negative for chills, fever and malaise/fatigue.  HENT: Negative for congestion and hearing loss.   Eyes: Negative for blurred vision.  Respiratory: Negative for cough and shortness of breath.   Cardiovascular: Negative for chest pain, palpitations and leg swelling.  Gastrointestinal: Negative for abdominal pain, blood in stool, constipation, diarrhea and melena.  Genitourinary: Negative for dysuria.  Musculoskeletal: Negative for falls and joint pain.  Skin: Negative for itching and rash.       Small ecchymoses of left lower abdomen  Neurological: Negative for dizziness and loss of consciousness.  Endo/Heme/Allergies: Bruises/bleeds easily.  Psychiatric/Behavioral: Negative for memory loss. The patient is not nervous/anxious and does not have insomnia.     Health Maintenance  Topic Date Due  . FOOT EXAM  05/26/2018  . HEMOGLOBIN A1C  11/28/2018  . COLONOSCOPY  02/22/2019  . OPHTHALMOLOGY EXAM  04/21/2019  . TETANUS/TDAP  09/20/2027  . INFLUENZA VACCINE  Completed  . PNEUMOCOCCAL POLYSACCHARIDE VACCINE AGE 8-64 HIGH RISK  Completed  . Hepatitis C Screening  Completed    . HIV Screening  Addressed    Physical Exam: Vitals:   06/01/18 1107  BP: 120/80  Pulse: 72  Temp: 98.4 F (36.9 C)  TempSrc: Oral  SpO2: 97%  Weight: 206 lb (93.4 kg)  Height: 5\' 8"  (1.727 m)   Body mass index is 31.32 kg/m. Physical Exam Constitutional:      General: He is not in acute distress.    Appearance: Normal appearance. He is obese.  HENT:     Head: Normocephalic and atraumatic.  Cardiovascular:     Rate and Rhythm: Normal rate and regular rhythm.     Heart sounds: Normal heart sounds.  Pulmonary:     Effort: Pulmonary effort is normal.     Breath sounds: Normal breath sounds.  Abdominal:     General: Bowel sounds are normal.  Musculoskeletal: Normal range of motion.  Skin:    General: Skin is warm and dry.  Neurological:     General: No focal deficit present.     Mental Status: He is alert and oriented to person, place, and time.     Cranial Nerves: No cranial nerve deficit.     Sensory: No sensory deficit.     Motor: No weakness.     Coordination: Coordination normal.     Gait: Gait normal.     Comments: Diabetic Foot Exam - Simple   Simple Foot Form Diabetic Foot exam was performed with the following findings:  Yes  06/01/2018 11:52 AM  Visual Inspection No deformities, no ulcerations, no other skin breakdown bilaterally:  Yes Sensation Testing Intact to touch and monofilament testing bilaterally:  Yes Pulse Check Posterior Tibialis and Dorsalis pulse intact bilaterally:  Yes Comments     Psychiatric:        Mood and Affect: Mood normal.        Behavior: Behavior normal.        Thought Content: Thought content normal.        Judgment: Judgment normal.     Labs reviewed: Basic Metabolic Panel: Recent Labs    09/15/17 0827 01/24/18 0830 05/29/18 0817  NA 140 139 141  K 3.7 3.9 4.1  CL 107 104 104  CO2 24 27 29   GLUCOSE 175* 118* 122*  BUN 15 13 13   CREATININE 0.99 0.85 0.93  CALCIUM 9.2 9.0 9.0   Liver Function  Tests: Recent Labs    05/29/18 0817  AST 17  ALT 14  BILITOT 1.3*  PROT 6.4   No results for input(s): LIPASE, AMYLASE in the last 8760 hours. No results for input(s): AMMONIA in the last 8760 hours. CBC: Recent Labs    09/15/17 0827 05/29/18 0817  WBC 6.6 5.9  NEUTROABS 4,990 4,059  HGB 15.0 15.0  HCT 42.9 44.1  MCV 87.4 89.1  PLT  262 292   Lipid Panel: Recent Labs    09/15/17 0827 05/29/18 0817  CHOL 106 103  HDL 41 45  LDLCALC 46 44  TRIG 108 59  CHOLHDL 2.6 2.3   Lab Results  Component Value Date   HGBA1C 6.2 (H) 05/29/2018    Assessment/Plan 1. Body mass index (BMI) of 31.0-31.9 in adult -again counseled on dietary changes and trying to exercise as able -is losing weight gradually which is the best way  2. Class 1 obesity due to excess calories with serious comorbidity and body mass index (BMI) of 30.0 to 30.9 in adult -as above, bmi corresponds to obese category with diabetes and htn  3. Hyperlipidemia associated with type 2 diabetes mellitus ([place]) -well controlled with current statin therapy and diabetes meds  4. Controlled type 2 diabetes mellitus without complication, without long-term current use of insulin ([place]) -well controlled, cont current regimen, readdress metformin dosing in april  5.  Essential hypertension: bp readings at home have mostly been under 130/80 and I'm happy with this for him Cont some checks after the holiday season 2-3 days per week to monitor  Labs/tests ordered:   Orders Placed This Encounter  Procedures  . Lipid panel    Standing Status:   Future    Standing Expiration Date:   06/02/2019    Order Specific Question:   Has the patient fasted?    Answer:   Yes  . Hemoglobin A1c    Standing Status:   Future    Standing Expiration Date:   06/02/2019  . CBC with Differential/Platelet    Standing Status:   Future    Standing Expiration Date:   06/02/2019  . BASIC METABOLIC PANEL WITH GFR    Standing Status:   Future     Standing Expiration Date:   06/02/2019    Next appt:  10/09/2018 med mgt, fasting labs before  Shadiamond Koska L. Eshal Propps, D.O. [facility] [address], [place] [address] Cell Phone (Mon-Fri 8am-5pm):  [phone] On Call:  [phone] & follow prompts after 5pm & weekends Office Phone:  [phone] Office Fax:  [phone]

## 2018-06-26 ENCOUNTER — Other Ambulatory Visit: Payer: Self-pay | Admitting: Internal Medicine

## 2018-07-29 ENCOUNTER — Other Ambulatory Visit: Payer: Self-pay | Admitting: Internal Medicine

## 2018-07-29 DIAGNOSIS — I10 Essential (primary) hypertension: Secondary | ICD-10-CM

## 2018-07-29 DIAGNOSIS — E1169 Type 2 diabetes mellitus with other specified complication: Secondary | ICD-10-CM

## 2018-07-29 DIAGNOSIS — E785 Hyperlipidemia, unspecified: Secondary | ICD-10-CM

## 2018-09-23 ENCOUNTER — Other Ambulatory Visit: Payer: Self-pay | Admitting: Internal Medicine

## 2018-09-23 DIAGNOSIS — E119 Type 2 diabetes mellitus without complications: Secondary | ICD-10-CM

## 2018-10-02 ENCOUNTER — Other Ambulatory Visit: Payer: PRIVATE HEALTH INSURANCE

## 2018-10-04 ENCOUNTER — Encounter: Payer: Self-pay | Admitting: Internal Medicine

## 2018-10-09 ENCOUNTER — Encounter: Payer: Self-pay | Admitting: Internal Medicine

## 2018-10-09 ENCOUNTER — Ambulatory Visit (INDEPENDENT_AMBULATORY_CARE_PROVIDER_SITE_OTHER): Payer: PRIVATE HEALTH INSURANCE | Admitting: Internal Medicine

## 2018-10-09 ENCOUNTER — Other Ambulatory Visit: Payer: Self-pay

## 2018-10-09 DIAGNOSIS — E6609 Other obesity due to excess calories: Secondary | ICD-10-CM

## 2018-10-09 DIAGNOSIS — E119 Type 2 diabetes mellitus without complications: Secondary | ICD-10-CM | POA: Diagnosis not present

## 2018-10-09 DIAGNOSIS — I10 Essential (primary) hypertension: Secondary | ICD-10-CM

## 2018-10-09 DIAGNOSIS — E1169 Type 2 diabetes mellitus with other specified complication: Secondary | ICD-10-CM | POA: Diagnosis not present

## 2018-10-09 DIAGNOSIS — Z6832 Body mass index (BMI) 32.0-32.9, adult: Secondary | ICD-10-CM

## 2018-10-09 DIAGNOSIS — E785 Hyperlipidemia, unspecified: Secondary | ICD-10-CM

## 2018-10-09 DIAGNOSIS — G4733 Obstructive sleep apnea (adult) (pediatric): Secondary | ICD-10-CM

## 2018-10-09 NOTE — Progress Notes (Signed)
Patient ID: [name], male   DOB: [birth year], 63 y.o.   MRN: [id number] This service is provided via telemedicine  No vital signs collected/recorded due to the encounter was a telemedicine visit.   Location of patient (ex: home, work):  HOME  Patient consents to a telephone visit:  YES  Location of the provider (ex: office, home):  OFFICE  Name of any referring provider:  DR [facility] Chundra Sauerwein  Names of all persons participating in the telemedicine service and their role in the encounter:  PATIENT, [name], [name], [name], DO  Time spent on call:  3:55    Provider:  Christell Steinmiller L. Mariea Clonts, D.O., C.M.D.  Goals of Care:  Advanced Directives 01/26/2018  Does Patient Have a Medical Advance Directive? No  Copy of Healthcare Power of Attorney in Chart? -  Would patient like information on creating a medical advance directive? No - Patient declined     Chief Complaint  Patient presents with  . TELEPHONE VISIT    4MTH FOLLOW-UP    HPI: Patient is a 63 y.o. male seen today for medical management of chronic diseases.    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  They wear masks in the common areas.  The tracking he's had to do has increased.  Overall, he's doing ok.  He's hoping the craziness has plateaued.  He is using a lot of hand sanitizer and washing hands well.  Only going to store once a week and being cautious.  He opted not to come in for labs--wants to wait until next time which is fine.  Eating habits:  About the same pattern.  He's trying to watch desserts--not buying at home.     He's been trying to go out walking at night.  Weight was 207 this morning.      Reviewed his bps with him.  He runs higher after taking the dog for a walk.  He does sit for a while after that.  They are better either before he goes or a good bit after he comes back.  Goal is less than 130/80.    Sugars:  Checks occasionally in the mornings:  115-125 fasting.  He's had two  sinus infections.  He went to urgent care 07/22/18.    Got amoxicillin/clavulanic acid 875/125mg  bid for 10 days.  Then it either came back or he got a new one.  Went back to same urgent care on 3/16--he had a fever so he got tested.  99 stg at his house and 100 at the urgent care.  Flu test was negative.  They did not suspect coronavirus then b/c he had no known travel or contacts.  Then had doxycycline hyclate 100mg  bid for 7 days.  It knocked it out.  No problem since except that he's been using nasal saline to keep his nose cleaned out--uses in am and once at work around noon and then at night.   Pollen will stuff him up when he goes outside.  He was trying to plan what he does if this happens again--counseled to call us or send [place] a mychart message.  He had to sleep in a chair for a while due to drainage and coughing.  It meant it disrupted his cpap therapy.  He cleans his equipment well.  He does have a h/o chronic issues from narrow sinus canals.  He had imaging of his sinuses before.  Usually just has one per year.  He already does flonase after showering and before going to work.  He has also been using zyrtec and mucinex high grade regularly.    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  He had two PPDs done for that.  That was late feb and early mar.    Past Medical History:  Diagnosis Date  . Diabetes mellitus without complication (HCC)    Type 2  . H/O lymph node excision [place] , [place]  . Hypertension   . Viral pneumonia 1965    Past Surgical History:  Procedure Laterality Date  . SKIN BIOPSY Right 05/21/15   forearm Robyne Askew PA    No Known Allergies  Outpatient Encounter Medications as of 10/09/2018  Medication Sig  . aspirin 81 MG tablet Take 81 mg by mouth daily.  Marland Kitchen atorvastatin (LIPITOR) 20 MG tablet TAKE 1 TABLET BY MOUTH ONCE DAILY 6 IN THE EVENING  . benazepril-hydrochlorthiazide (LOTENSIN HCT) 20-12.5 MG tablet TAKE 1 TABLET BY MOUTH ONCE  DAILY  . cetirizine (ZYRTEC) 10 MG tablet Take 10 mg by mouth as needed.   . fluticasone (FLONASE) 50 MCG/ACT nasal spray USE 2 SPRAY(S) IN EACH NOSTRIL ONCE DAILY  . guaiFENesin (MUCINEX) 600 MG 12 hr tablet Take 600 mg by mouth 2 (two) times daily.  . metFORMIN (GLUCOPHAGE) 1000 MG tablet Take 1 & 1/2 tablets by mouth once daily in the morning with breakfast and take 1 tablet with evening meal. E11.9  . psyllium (REGULOID) 0.52 G capsule Take 0.52 g by mouth daily.  . TRADJENTA 5 MG TABS tablet TAKE 1 TABLET BY MOUTH ONCE DAILY  . TRULICITY 4.23 NT/6.1WE SOPN INJECT 1 PEN SUBCUTANEOUSLY ONCE A WEEK   No facility-administered encounter medications on file as of 10/09/2018.     Review of Systems:  Review of Systems  Constitutional: Negative for chills, fever and malaise/fatigue.  HENT: Positive for congestion. Negative for hearing loss.        Allergic rhinitis  Eyes: Negative for blurred vision.  Respiratory: Negative for cough and shortness of breath.   Cardiovascular: Negative for chest pain and leg swelling.  Gastrointestinal: Negative for abdominal pain, blood in stool, constipation, diarrhea and melena.  Genitourinary: Negative for dysuria.  Musculoskeletal: Negative for myalgias.  Skin: Negative for itching and rash.  Neurological: Negative for dizziness and loss of consciousness.  Endo/Heme/Allergies: Positive for environmental allergies.       Diabetes  Psychiatric/Behavioral: Negative for depression and memory loss. The patient is not nervous/anxious and does not have insomnia.     Health Maintenance  Topic Date Due  . HEMOGLOBIN A1C  11/28/2018  . INFLUENZA VACCINE  01/20/2019  . COLONOSCOPY  02/22/2019  . OPHTHALMOLOGY EXAM  04/21/2019  . FOOT EXAM  06/02/2019  . TETANUS/TDAP  09/20/2027  . PNEUMOCOCCAL POLYSACCHARIDE VACCINE AGE 7-64 HIGH RISK  Completed  . Hepatitis C Screening  Completed  . HIV Screening  Addressed    Physical Exam: Could not be performed as  visit non face-to-face via phone  Labs reviewed: Basic Metabolic Panel: Recent Labs    01/24/18 0830 05/29/18 0817  NA 139 141  K 3.9 4.1  CL 104 104  CO2 27 29  GLUCOSE 118* 122*  BUN 13 13  CREATININE 0.85 0.93  CALCIUM 9.0 9.0   Liver Function Tests: Recent Labs    05/29/18 0817  AST 17  ALT 14  BILITOT 1.3*  PROT 6.4   No results for input(s):  LIPASE, AMYLASE in the last 8760 hours. No results for input(s): AMMONIA in the last 8760 hours. CBC: Recent Labs    05/29/18 0817  WBC 5.9  NEUTROABS 4,059  HGB 15.0  HCT 44.1  MCV 89.1  PLT 292   Lipid Panel: Recent Labs    05/29/18 0817  CHOL 103  HDL 45  LDLCALC 44  TRIG 59  CHOLHDL 2.3   Lab Results  Component Value Date   HGBA1C 6.2 (H) 05/29/2018    Assessment/Plan 1. Essential hypertension -bps are running slightly above goal with his home meter -he will continue to check but more consistently twice a week and send me his results via mychart -if bps staying consistently over 130/80, we will plan to change him to an arb/hctz in place of his ace/hctz  2. Controlled type 2 diabetes mellitus without complication, without long-term current use of insulin (HCC) -cont current regimen -may make a change at next visit due to higher dose of metformin per day than is typically recommended -cont avoiding sweets at home and walking for exercise  3. Hyperlipidemia associated with type 2 diabetes mellitus ([place]) -cont current statin therapy with lipitor, diet and exercise  4. Class 1 obesity due to excess calories with serious comorbidity and body mass index (BMI) of 32.0 to 32.9 in adult -cont diet and exercise program  5. Mild obstructive sleep apnea -clean machine again and get back on cpap since sinus infection cleared for about a month now, be very careful about cleanliness of it  Labs/tests ordered:   Orders Placed This Encounter  Procedures  . CBC with Differential/Platelet    Standing Status:    Future    Standing Expiration Date:   10/09/2019  . COMPLETE METABOLIC PANEL WITH GFR    Standing Status:   Future    Standing Expiration Date:   10/09/2019  . Hemoglobin A1c    Standing Status:   Future    Standing Expiration Date:   10/09/2019  . Lipid panel    Standing Status:   Future    Standing Expiration Date:   10/09/2019    Order Specific Question:   Has the patient fasted?    Answer:   Yes   Next appt:  02/12/2019  Non face-to-face time spent on televisit:  22 minutes  Clio Gerhart L. Dilcia Rybarczyk, D.O. [facility] [address], [place] [address] Cell Phone (Mon-Fri 8am-5pm):  [phone] On Call:  [phone] & follow prompts after 5pm & weekends Office Phone:  [phone] Office Fax:  [phone]

## 2018-10-09 NOTE — Patient Instructions (Signed)
Please check your blood pressures twice a week through May and send me your results.  Goal is less than 130/80.  Check your sugars more often as well (ideally 3 times a week fasting).  We will check your average sugar before we meet in august.  Also, if you have problems with your sinuses, let me know and we can at least do a phone or possibly a web visit.  Stay well!

## 2018-10-21 ENCOUNTER — Other Ambulatory Visit: Payer: Self-pay | Admitting: Internal Medicine

## 2018-10-21 DIAGNOSIS — I10 Essential (primary) hypertension: Secondary | ICD-10-CM

## 2018-10-21 DIAGNOSIS — E119 Type 2 diabetes mellitus without complications: Secondary | ICD-10-CM

## 2018-10-21 DIAGNOSIS — E1169 Type 2 diabetes mellitus with other specified complication: Secondary | ICD-10-CM

## 2018-10-21 DIAGNOSIS — E785 Hyperlipidemia, unspecified: Secondary | ICD-10-CM

## 2018-10-26 ENCOUNTER — Other Ambulatory Visit: Payer: Self-pay | Admitting: Internal Medicine

## 2018-11-04 ENCOUNTER — Other Ambulatory Visit: Payer: Self-pay | Admitting: Internal Medicine

## 2018-11-27 ENCOUNTER — Encounter: Payer: Self-pay | Admitting: Internal Medicine

## 2018-11-27 NOTE — Telephone Encounter (Signed)
Routed to Reed, Tiffany L, DO  

## 2018-12-22 ENCOUNTER — Other Ambulatory Visit: Payer: Self-pay | Admitting: Internal Medicine

## 2018-12-22 DIAGNOSIS — E119 Type 2 diabetes mellitus without complications: Secondary | ICD-10-CM

## 2019-01-24 ENCOUNTER — Other Ambulatory Visit: Payer: Self-pay | Admitting: Internal Medicine

## 2019-01-24 DIAGNOSIS — E119 Type 2 diabetes mellitus without complications: Secondary | ICD-10-CM

## 2019-02-03 ENCOUNTER — Other Ambulatory Visit: Payer: Self-pay | Admitting: Internal Medicine

## 2019-02-08 ENCOUNTER — Other Ambulatory Visit: Payer: PRIVATE HEALTH INSURANCE

## 2019-02-08 ENCOUNTER — Other Ambulatory Visit: Payer: Self-pay

## 2019-02-08 DIAGNOSIS — I10 Essential (primary) hypertension: Secondary | ICD-10-CM

## 2019-02-08 DIAGNOSIS — G4733 Obstructive sleep apnea (adult) (pediatric): Secondary | ICD-10-CM

## 2019-02-08 DIAGNOSIS — E1169 Type 2 diabetes mellitus with other specified complication: Secondary | ICD-10-CM

## 2019-02-08 DIAGNOSIS — E119 Type 2 diabetes mellitus without complications: Secondary | ICD-10-CM

## 2019-02-08 DIAGNOSIS — E785 Hyperlipidemia, unspecified: Secondary | ICD-10-CM

## 2019-02-09 LAB — LIPID PANEL
Cholesterol: 96 mg/dL (ref <200)
HDL: 43 mg/dL (ref >=40)
LDL Cholesterol (Calc): 40 mg/dL (calc)
Non-HDL Cholesterol (Calc): 53 mg/dL (calc) (ref <130)
Total CHOL/HDL Ratio: 2.2 (calc) (ref <5.0)
Triglycerides: 53 mg/dL (ref <150)

## 2019-02-09 LAB — HEMOGLOBIN A1C
Hgb A1c MFr Bld: 6.1 % of total Hgb — ABNORMAL HIGH (ref <5.7)
Mean Plasma Glucose: 128 (calc)
eAG (mmol/L): 7.1 (calc)

## 2019-02-09 LAB — COMPLETE METABOLIC PANEL WITH GFR
AG Ratio: 2.2 (calc) (ref 1.0–2.5)
ALT: 15 U/L (ref 9–46)
AST: 17 U/L (ref 10–35)
Albumin: 4.1 g/dL (ref 3.6–5.1)
Alkaline phosphatase (APISO): 46 U/L (ref 35–144)
BUN: 15 mg/dL (ref 7–25)
CO2: 28 mmol/L (ref 20–32)
Calcium: 9.3 mg/dL (ref 8.6–10.3)
Chloride: 105 mmol/L (ref 98–110)
Creat: 0.88 mg/dL (ref 0.70–1.25)
GFR, Est African American: 106 mL/min/{1.73_m2} (ref >=60)
GFR, Est Non African American: 91 mL/min/{1.73_m2} (ref >=60)
Globulin: 1.9 g/dL (calc) (ref 1.9–3.7)
Glucose, Bld: 118 mg/dL — ABNORMAL HIGH (ref 65–99)
Potassium: 3.9 mmol/L (ref 3.5–5.3)
Sodium: 141 mmol/L (ref 135–146)
Total Bilirubin: 1.2 mg/dL (ref 0.2–1.2)
Total Protein: 6 g/dL — ABNORMAL LOW (ref 6.1–8.1)

## 2019-02-09 LAB — CBC WITH DIFFERENTIAL/PLATELET
Absolute Monocytes: 359 cells/uL (ref 200–950)
Basophils Absolute: 52 cells/uL (ref 0–200)
Basophils Relative: 1 %
Eosinophils Absolute: 120 cells/uL (ref 15–500)
Eosinophils Relative: 2.3 %
HCT: 40.7 % (ref 38.5–50.0)
Hemoglobin: 13.8 g/dL (ref 13.2–17.1)
Lymphs Abs: 1274 cells/uL (ref 850–3900)
MCH: 30.1 pg (ref 27.0–33.0)
MCHC: 33.9 g/dL (ref 32.0–36.0)
MCV: 88.7 fL (ref 80.0–100.0)
MPV: 9.1 fL (ref 7.5–12.5)
Monocytes Relative: 6.9 %
Neutro Abs: 3396 cells/uL (ref 1500–7800)
Neutrophils Relative %: 65.3 %
Platelets: 277 10*3/uL (ref 140–400)
RBC: 4.59 10*6/uL (ref 4.20–5.80)
RDW: 12.8 % (ref 11.0–15.0)
Total Lymphocyte: 24.5 %
WBC: 5.2 10*3/uL (ref 3.8–10.8)

## 2019-02-12 ENCOUNTER — Encounter: Payer: Self-pay | Admitting: Internal Medicine

## 2019-02-12 ENCOUNTER — Other Ambulatory Visit: Payer: Self-pay

## 2019-02-12 ENCOUNTER — Ambulatory Visit (INDEPENDENT_AMBULATORY_CARE_PROVIDER_SITE_OTHER): Payer: PRIVATE HEALTH INSURANCE | Admitting: Internal Medicine

## 2019-02-12 VITALS — BP 128/72 | HR 85 | Temp 97.9°F | Ht 68.0 in | Wt 206.0 lb

## 2019-02-12 DIAGNOSIS — E119 Type 2 diabetes mellitus without complications: Secondary | ICD-10-CM | POA: Diagnosis not present

## 2019-02-12 DIAGNOSIS — G4733 Obstructive sleep apnea (adult) (pediatric): Secondary | ICD-10-CM | POA: Diagnosis not present

## 2019-02-12 DIAGNOSIS — Z23 Encounter for immunization: Secondary | ICD-10-CM

## 2019-02-12 DIAGNOSIS — E1169 Type 2 diabetes mellitus with other specified complication: Secondary | ICD-10-CM

## 2019-02-12 DIAGNOSIS — Z1211 Encounter for screening for malignant neoplasm of colon: Secondary | ICD-10-CM

## 2019-02-12 DIAGNOSIS — E785 Hyperlipidemia, unspecified: Secondary | ICD-10-CM

## 2019-02-12 DIAGNOSIS — Z6831 Body mass index (BMI) 31.0-31.9, adult: Secondary | ICD-10-CM

## 2019-02-12 DIAGNOSIS — E6609 Other obesity due to excess calories: Secondary | ICD-10-CM

## 2019-02-12 MED ORDER — METFORMIN HCL 1000 MG PO TABS: 1000.0000 mg | ORAL_TABLET | Freq: Two times a day (BID) | ORAL | 3 refills | Status: DC

## 2019-02-12 NOTE — Patient Instructions (Addendum)
You may try xyzal instead of zyrtec for your allergies.  Fat and Cholesterol Restricted Eating Plan Getting too much fat and cholesterol in your diet may cause health problems. Choosing the right foods helps keep your fat and cholesterol at normal levels. This can keep you from getting certain diseases. What are tips for following this plan? Meal planning  At meals, divide your plate into four equal parts: ? Fill one-half of your plate with vegetables and green salads. ? Fill one-fourth of your plate with whole grains. ? Fill one-fourth of your plate with low-fat (lean) protein foods.  Eat fish that is high in omega-3 fats at least two times a week. This includes mackerel, tuna, sardines, and salmon.  Eat foods that are high in fiber, such as whole grains, beans, apples, broccoli, carrots, peas, and barley. General tips   Work with your doctor to lose weight if you need to.  Avoid: ? Foods with added sugar. ? Fried foods. ? Foods with partially hydrogenated oils.  Limit alcohol intake to no more than 1 drink a day for nonpregnant women and 2 drinks a day for men. One drink equals 12 oz of beer, 5 oz of wine, or 1 oz of hard liquor. Reading food labels  Check food labels for: ? Trans fats. ? Partially hydrogenated oils. ? Saturated fat (g) in each serving. ? Cholesterol (mg) in each serving. ? Fiber (g) in each serving.  Choose foods with healthy fats, such as: ? Monounsaturated fats. ? Polyunsaturated fats. ? Omega-3 fats.  Choose grain products that have whole grains. Look for the word "whole" as the first word in the ingredient list. Cooking  Cook foods using low-fat methods. These include baking, boiling, grilling, and broiling.  Eat more home-cooked foods. Eat at restaurants and buffets less often.  Avoid cooking using saturated fats, such as butter, cream, palm oil, palm kernel oil, and coconut oil. Recommended foods  Fruits  All fresh, canned (in natural  juice), or frozen fruits. Vegetables  Fresh or frozen vegetables (raw, steamed, roasted, or grilled). Green salads. Grains  Whole grains, such as whole wheat or whole grain breads, crackers, cereals, and pasta. Unsweetened oatmeal, bulgur, barley, quinoa, or brown rice. Corn or whole wheat flour tortillas. Meats and other protein foods  Ground beef (85% or leaner), grass-fed beef, or beef trimmed of fat. Skinless chicken or [place]. Ground chicken or [place]. Pork trimmed of fat. All fish and seafood. Egg whites. Dried beans, peas, or lentils. Unsalted nuts or seeds. Unsalted canned beans. Nut butters without added sugar or oil. Dairy  Low-fat or nonfat dairy products, such as skim or 1% milk, 2% or reduced-fat cheeses, low-fat and fat-free ricotta or cottage cheese, or plain low-fat and nonfat yogurt. Fats and oils  Tub margarine without trans fats. Light or reduced-fat mayonnaise and salad dressings. Avocado. Olive, canola, sesame, or safflower oils. The items listed above may not be a complete list of foods and beverages you can eat. Contact a dietitian for more information. Foods to avoid Fruits  Canned fruit in heavy syrup. Fruit in cream or butter sauce. Fried fruit. Vegetables  Vegetables cooked in cheese, cream, or butter sauce. Fried vegetables. Grains  White bread. White pasta. White rice. Cornbread. Bagels, pastries, and croissants. Crackers and snack foods that contain trans fat and hydrogenated oils. Meats and other protein foods  Fatty cuts of meat. Ribs, chicken wings, bacon, sausage, bologna, salami, chitterlings, fatback, hot dogs, bratwurst, and packaged lunch meats. Liver and organ  meats. Whole eggs and egg yolks. Chicken and [place] with skin. Fried meat. Dairy  Whole or 2% milk, cream, half-and-half, and cream cheese. Whole milk cheeses. Whole-fat or sweetened yogurt. Full-fat cheeses. Nondairy creamers and whipped toppings. Processed cheese, cheese spreads, and  cheese curds. Beverages  Alcohol. Sugar-sweetened drinks such as sodas, lemonade, and fruit drinks. Fats and oils  Butter, stick margarine, lard, shortening, ghee, or bacon fat. Coconut, palm kernel, and palm oils. Sweets and desserts  Corn syrup, sugars, honey, and molasses. Candy. Jam and jelly. Syrup. Sweetened cereals. Cookies, pies, cakes, donuts, muffins, and ice cream. The items listed above may not be a complete list of foods and beverages you should avoid. Contact a dietitian for more information. Summary  Choosing the right foods helps keep your fat and cholesterol at normal levels. This can keep you from getting certain diseases.  At meals, fill one-half of your plate with vegetables and green salads.  Eat high-fiber foods, like whole grains, beans, apples, carrots, peas, and barley.  Limit added sugar, saturated fats, alcohol, and fried foods. This information is not intended to replace advice given to you by your health care provider. Make sure you discuss any questions you have with your health care provider. Document Released: 12/07/2011 Document Revised: 02/08/2018 Document Reviewed: 02/22/2017 Elsevier Patient Education  2020 [employer].

## 2019-02-12 NOTE — Progress Notes (Signed)
Location:  [facility]   Advanced Directives 01/26/2018  Does Patient Have a Medical Advance Directive? No  Copy of Healthcare Power of Attorney in Chart? -  Would patient like information on creating a medical advance directive? No - Patient declined     Chief Complaint  Patient presents with  . Medical Management of Chronic Issues    22mth follow-up    HPI: Patient is a 63 y.o. male seen today for medical management of chronic diseases.    He presents with a main complaint of tick bite. His wife noticed the tick yesterday on his left hip. She removed the tick with tweezers and cleaned the area with some rubbing alcohol. He does not think the tick had latched for more than 24 hours. He denies any feelings of fatigue, joint pain or rash.   One month ago he had another tick bite on his right inner groin. He did not know how long the tick was latched and went to urgent care. Urgent care removed the tick and placed him on oral antibiotics. Since then he has not complaints of fatigue, joint pain or rash. His only complaint is that it has not gone away and feels like a pimple.   He continues to take his metformin daily. He is also limiting sugars and carbohydrates in his diet. No recent efforts to lose weight. At times he has right knee pain and worries pain will increase if he exercises.   Right knee pain is related to activity. If he is very active the pain will increase. He admits to taking advil maybe 1-2 times a month for knee pain. No recent falls or injuries present.   He is not using his CPAP machine at the moment. He claims past sinus infections made him stop. He has not scheduled a follow up with the sleep clinic. He denies any excessive day time sleepiness.   He had a dental exam June 2020.  He is scheduled for his yearly eye exam in November 2020  He is asking when his next colonoscopy is      Past Medical History:  Diagnosis Date  . Diabetes mellitus without  complication (HCC)    Type 2  . H/O lymph node excision [place] , [place]  . Hypertension   . Viral pneumonia 1965    Past Surgical History:  Procedure Laterality Date  . SKIN BIOPSY Right 05/21/15   forearm Robyne Askew PA    No Known Allergies  Outpatient Encounter Medications as of 02/12/2019  Medication Sig  . aspirin 81 MG tablet Take 81 mg by mouth daily.  Marland Kitchen atorvastatin (LIPITOR) 20 MG tablet TAKE 1 TABLET BY MOUTH ONCE DAILY AT 6 IN THE EVENING  . benazepril-hydrochlorthiazide (LOTENSIN HCT) 20-12.5 MG tablet Take 1 tablet by mouth once daily  . cetirizine (ZYRTEC) 10 MG tablet Take 10 mg by mouth as needed.   . fluticasone (FLONASE) 50 MCG/ACT nasal spray USE 2 SPRAY(S) IN EACH NOSTRIL ONCE DAILY  . guaiFENesin (MUCINEX) 600 MG 12 hr tablet Take 600 mg by mouth 2 (two) times daily.  . metFORMIN (GLUCOPHAGE) 1000 MG tablet TAKE 1 & 1/2  TABLETS BY MOUTH ONCE DAILY IN THE MORNING AND THEN TAKE 1 TABLET WITH SUPPER  . psyllium (REGULOID) 0.52 G capsule Take 0.52 g by mouth daily.  . TRADJENTA 5 MG TABS tablet Take 1 tablet by mouth once daily  . TRULICITY [id number] 0000000 SOPN INJECT 1 PEN SUBCUTANEOULY ONCE A  WEEK   No facility-administered encounter medications on file as of 02/12/2019.     Review of Systems:  Review of Systems  Constitutional: Negative for fatigue and fever.  HENT: Negative for dental problem, hearing loss and trouble swallowing.   Eyes: Negative for photophobia and visual disturbance.  Respiratory: Negative for cough and shortness of breath.   Cardiovascular: Negative for chest pain, palpitations and leg swelling.  Gastrointestinal: Negative for constipation, diarrhea and nausea.  Endocrine: Negative for polydipsia, polyphagia and polyuria.  Genitourinary: Negative for dysuria, frequency and hematuria.  Musculoskeletal: Positive for arthralgias. Negative for joint swelling and myalgias.       Right knee pain  Skin:       Tick bite on left hip and  right groin  Neurological: Negative for weakness, light-headedness and headaches.  Psychiatric/Behavioral: Negative for dysphoric mood and sleep disturbance. The patient is not nervous/anxious.     Health Maintenance  Topic Date Due  . INFLUENZA VACCINE  01/20/2019  . COLONOSCOPY  02/22/2019  . OPHTHALMOLOGY EXAM  04/21/2019  . FOOT EXAM  06/02/2019  . HEMOGLOBIN A1C  08/11/2019  . TETANUS/TDAP  09/20/2027  . PNEUMOCOCCAL POLYSACCHARIDE VACCINE AGE 79-64 HIGH RISK  Completed  . Hepatitis C Screening  Completed  . HIV Screening  Addressed    Physical Exam: Vitals:   02/12/19 0831  BP: 128/72  Pulse: 85  Temp: 97.9 F (36.6 C)  TempSrc: Oral  SpO2: 97%  Weight: 206 lb (93.4 kg)  Height: 5\' 8"  (1.727 m)   Body mass index is 31.32 kg/m. Physical Exam Vitals signs reviewed.  Constitutional:      General: He is not in acute distress.    Appearance: Normal appearance. He is not ill-appearing.  HENT:     Head: Normocephalic.  Cardiovascular:     Rate and Rhythm: Normal rate and regular rhythm.     Pulses: Normal pulses.     Heart sounds: Normal heart sounds. No murmur.  Pulmonary:     Effort: Pulmonary effort is normal. No respiratory distress.     Breath sounds: Normal breath sounds. No wheezing.  Abdominal:     General: Abdomen is flat. Bowel sounds are normal.     Palpations: Abdomen is soft.  Musculoskeletal: Normal range of motion.     Right lower leg: No edema.     Left lower leg: No edema.  Skin:    General: Skin is warm and dry.     Capillary Refill: Capillary refill takes less than 2 seconds.     Comments: 1 cm red papule to right inner groin. Hard to touch. No drainage. Surrounding skin with no discoloration or firmness. Left hip with 2 cm bite mark. Area red and swollen, no drainage present. No target rash or  tenderness noted during exam.   Neurological:     General: No focal deficit present.     Mental Status: He is alert and oriented to person, place,  and time. Mental status is at baseline.  Psychiatric:        Mood and Affect: Mood normal.        Behavior: Behavior normal.     Labs reviewed: Basic Metabolic Panel: Recent Labs    05/29/18 0817 02/08/19 0822  NA 141 141  K 4.1 3.9  CL 104 105  CO2 29 28  GLUCOSE 122* 118*  BUN 13 15  CREATININE 0.93 0.88  CALCIUM 9.0 9.3   Liver Function Tests: Recent Labs    05/29/18  [id number] 02/08/19 0822  AST 17 17  ALT 14 15  BILITOT 1.3* 1.2  PROT 6.4 6.0*   No results for input(s): LIPASE, AMYLASE in the last 8760 hours. No results for input(s): AMMONIA in the last 8760 hours. CBC: Recent Labs    05/29/18 0817 02/08/19 0822  WBC 5.9 5.2  NEUTROABS 4,059 3,396  HGB 15.0 13.8  HCT 44.1 40.7  MCV 89.1 88.7  PLT 292 277   Lipid Panel: Recent Labs    05/29/18 0817 02/08/19 0822  CHOL 103 96  HDL 45 43  LDLCALC 44 40  TRIG 59 53  CHOLHDL 2.3 2.2   Lab Results  Component Value Date   HGBA1C 6.1 (H) 02/08/2019    Procedures since last visit: No results found.  Assessment/Plan 1. Controlled type 2 diabetes mellitus without complication, without long-term current use of insulin (HCC) - hemoglobin A1C 6.1, at goal - at one point he was taking metformin 1500 mg every morning and 1000 mg every evening, he has been educated about this high dose - continue metformin 1000 mg twice a day with meals - continue diet low in sugar and fats - encourage light exercise -hemoglobin A1C- future - complete metabolic panel with GFR- future - microalbumin- future - diabetic foot exam- future  2. Colon cancer screening - last exam 5 years ago - referral to [facility] to schedule colonoscopy  3. Hyperlipidemia associated with type 2 diabetes mellitus (HCC) - lipid panel at goal - continue current medication regimen - encourage a diet low in fats and fried foods - lipid panel- future - complete blood count with differential/lplatelets- future  4. Mild obstructive sleep  apnea - educated about the need to restart using CPAP machine at night - encourage following up with sleep clinic - may take zyrtec or Xyzal allergu medication if allergies prevent CPAP compliance  5. Body mass index of 31.0 to 31.9 in adult - no change is weight since last visit - encourage light exercise and counting calories - may consider weight and wellness clinic referral in future  6. Class 1 obesity due to excess calories with serious comorbidity and body mass index (BMI) of 31.0 to 31.9 in adult - same as above   7. Need for influenza vaccination - administer Fluad Quad (high dose 65+) due to comorbidities  8.Tick bite - area benign with no signs of infection - patient educated when to seek medical attention    Labs/tests ordered:  Referral for colonoscopy, complete blood count with differential/ platelets, complete metabolic panel with GFR, hemoglobin A1C, lipid panel- future Next appt:  Follow up in 4 months

## 2019-03-26 ENCOUNTER — Telehealth: Payer: Self-pay | Admitting: Gastroenterology

## 2019-03-26 NOTE — Telephone Encounter (Signed)
Hi Dr. Tarri Glenn, we have received a referral from pt's PCP for a repeat colon. Pt had a colonoscopy in 2015. His records will be placed on your desk for review. Please advise on scheduling. Thank you.

## 2019-03-29 ENCOUNTER — Encounter: Payer: Self-pay | Admitting: Gastroenterology

## 2019-03-29 NOTE — Telephone Encounter (Signed)
Pt schedule for Direct colon on 11/20 at 9:00am at [facility] per Dr. Tarri Glenn' notes.

## 2019-04-23 LAB — HM DIABETES EYE EXAM

## 2019-04-27 ENCOUNTER — Encounter: Payer: Self-pay | Admitting: Gastroenterology

## 2019-04-27 ENCOUNTER — Other Ambulatory Visit: Payer: Self-pay

## 2019-04-27 ENCOUNTER — Ambulatory Visit (AMBULATORY_SURGERY_CENTER): Payer: PRIVATE HEALTH INSURANCE | Admitting: *Deleted

## 2019-04-27 VITALS — Temp 97.1°F | Ht 68.0 in | Wt 210.0 lb

## 2019-04-27 DIAGNOSIS — Z86010 Personal history of colonic polyps: Secondary | ICD-10-CM

## 2019-04-27 DIAGNOSIS — Z1159 Encounter for screening for other viral diseases: Secondary | ICD-10-CM

## 2019-04-27 MED ORDER — SUPREP BOWEL PREP KIT 17.5-3.13-1.6 GM/177ML PO SOLN: 1.0000 | Freq: Once | ORAL | 0 refills | Status: AC

## 2019-04-27 NOTE — Progress Notes (Signed)
No egg or soy allergy known to patient  No issues with past sedation with any surgeries  or procedures, no intubation problems  No diet pills per patient No home 02 use per patient  No blood thinners per patient  Pt denies issues with constipation  No A fib or A flutter  EMMI video sent to pt's e mail   covid test 11-17 1210 pm   Due to the COVID-19 pandemic we are asking patients to follow these guidelines. Please only bring one care partner. Please be aware that your care partner may wait in the car in the parking lot or if they feel like they will be too hot to wait in the car, they may wait in the lobby on the 4th floor. All care partners are required to wear a mask the entire time (we do not have any that we can provide them), they need to practice social distancing, and we will do a Covid check for all patient's and care partners when you arrive. Also we will check their temperature and your temperature. If the care partner waits in their car they need to stay in the parking lot the entire time and we will call them on their cell phone when the patient is ready for discharge so they can bring the car to the front of the building. Also all patient's will need to wear a mask into building.  suprep coupon $15

## 2019-04-28 ENCOUNTER — Other Ambulatory Visit: Payer: Self-pay | Admitting: Internal Medicine

## 2019-04-28 DIAGNOSIS — E1169 Type 2 diabetes mellitus with other specified complication: Secondary | ICD-10-CM

## 2019-04-28 DIAGNOSIS — E785 Hyperlipidemia, unspecified: Secondary | ICD-10-CM

## 2019-04-28 DIAGNOSIS — I10 Essential (primary) hypertension: Secondary | ICD-10-CM

## 2019-05-02 ENCOUNTER — Other Ambulatory Visit: Payer: Self-pay | Admitting: Internal Medicine

## 2019-05-05 ENCOUNTER — Other Ambulatory Visit: Payer: Self-pay | Admitting: Internal Medicine

## 2019-05-08 ENCOUNTER — Ambulatory Visit (INDEPENDENT_AMBULATORY_CARE_PROVIDER_SITE_OTHER): Payer: PRIVATE HEALTH INSURANCE

## 2019-05-08 ENCOUNTER — Other Ambulatory Visit: Payer: Self-pay | Admitting: Gastroenterology

## 2019-05-08 DIAGNOSIS — Z1159 Encounter for screening for other viral diseases: Secondary | ICD-10-CM

## 2019-05-09 LAB — SARS CORONAVIRUS 2 (TAT 6-24 HRS): SARS Coronavirus 2: NEGATIVE

## 2019-05-11 ENCOUNTER — Ambulatory Visit (AMBULATORY_SURGERY_CENTER): Payer: PRIVATE HEALTH INSURANCE | Admitting: Gastroenterology

## 2019-05-11 ENCOUNTER — Other Ambulatory Visit: Payer: Self-pay

## 2019-05-11 ENCOUNTER — Encounter: Payer: Self-pay | Admitting: Gastroenterology

## 2019-05-11 VITALS — BP 124/77 | HR 101 | Temp 98.6°F | Resp 18 | Ht 68.0 in | Wt 210.0 lb

## 2019-05-11 DIAGNOSIS — D128 Benign neoplasm of rectum: Secondary | ICD-10-CM

## 2019-05-11 DIAGNOSIS — Z86010 Personal history of colonic polyps: Secondary | ICD-10-CM | POA: Diagnosis present

## 2019-05-11 MED ORDER — SODIUM CHLORIDE 0.9 % IV SOLN: 500.0000 mL | Freq: Once | INTRAVENOUS | Status: DC

## 2019-05-11 NOTE — Op Note (Signed)
[facility] Patient Name: [name] Procedure Date: 05/11/2019 8:51 AM MRN: [id number] Endoscopist: Thornton Park MD, MD Age: 63 Referring MD:  Date of Birth: July 09, 1955 Gender: Male Account #: [id number] Procedure:                Colonoscopy Indications:              Surveillance: Personal history of colonic polyps                            (unknown histology) on last colonoscopy 5 years ago                           Colonoscopy 12/2006: tics, hyperplastic sigmoid and                            rectal polyps, ext hemorrhoids                           Colonoscopy 02/21/14 (Dr. Mitchell Heir): mid ascending                            tubular adenoma, sigmoid hyperplastic polyp Medicines:                Monitored Anesthesia Care Procedure:                Pre-Anesthesia Assessment:                           - Prior to the procedure, a History and Physical                            was performed, and patient medications and                            allergies were reviewed. The patient's tolerance of                            previous anesthesia was also reviewed. The risks                            and benefits of the procedure and the sedation                            options and risks were discussed with the patient.                            All questions were answered, and informed consent                            was obtained. Prior Anticoagulants: The patient has                            taken no previous anticoagulant or antiplatelet  agents. ASA Grade Assessment: III - A patient with                            severe systemic disease. After reviewing the risks                            and benefits, the patient was deemed in                            satisfactory condition to undergo the procedure.                           After obtaining informed consent, the colonoscope                            was passed under direct vision.  Throughout the                            procedure, the patient's blood pressure, pulse, and                            oxygen saturations were monitored continuously. The                            Colonoscope was introduced through the anus and                            advanced to the the terminal ileum, with                            identification of the appendiceal orifice and IC                            valve. A second forward view of the right colon was                            performed. The colonoscopy was performed without                            difficulty. The patient tolerated the procedure                            well. The quality of the bowel preparation was                            good. The terminal ileum, ileocecal valve,                            appendiceal orifice, and rectum were photographed. Scope In: 8:59:00 AM Scope Out: 9:13:38 AM Scope Withdrawal Time: 0 hours 10 minutes 49 seconds  Total Procedure Duration: 0 hours 14 minutes 38 seconds  Findings:                 Non-bleeding external and internal hemorrhoids  were                            found. The hemorrhoids were small.                           Multiple small and large-mouthed diverticula were                            found in the sigmoid colon and descending colon and                            ascending colon.                           A 10 mm polyp was found in the proximal rectum. The                            polyp was pedunculated. The polyp was removed with                            a hot snare. Resection and retrieval were complete.                            Estimated blood loss: none.                           The exam was otherwise without abnormality on                            direct and retroflexion views. Complications:            No immediate complications. Estimated Blood Loss:     Estimated blood loss: none. Impression:               - Non-bleeding external and  internal hemorrhoids.                           - Diverticulosis in the sigmoid colon and in the                            descending colon and in the ascending colon.                           - One 10 mm polyp in the proximal rectum, removed                            with a hot snare. Resected and retrieved.                           - The examination was otherwise normal on direct                            and retroflexion views. Recommendation:           - Patient has a contact number  available for                            emergencies. The signs and symptoms of potential                            delayed complications were discussed with the                            patient. Return to normal activities tomorrow.                            Written discharge instructions were provided to the                            patient.                           - High fiber diet.                           - Continue present medications.                           - Await pathology results.                           - Repeat colonoscopy date to be determined after                            pending pathology results are reviewed for                            surveillance based on pathology results. Thornton Park MD, MD 05/11/2019 9:23:04 AM This report has been signed electronically.

## 2019-05-11 NOTE — Progress Notes (Signed)
A/ox3, pleased with MAC, report to RN 

## 2019-05-11 NOTE — Progress Notes (Signed)
Called to room to assist during endoscopic procedure.  Patient ID and intended procedure confirmed with present staff. Received instructions for my participation in the procedure from the performing physician.  

## 2019-05-11 NOTE — Patient Instructions (Signed)
Non-bleeding external and internal hemorrhoids. Diverticulosis. 57mm polyp removed today. Dr Tarri Glenn will mail you a letter concerning the pathology of the polyp.  You should receive this letter in 1-3 weeks.    YOU HAD AN ENDOSCOPIC PROCEDURE TODAY AT [facility]:   Refer to the procedure report that was given to you for any specific questions about what was found during the examination.  If the procedure report does not answer your questions, please call your gastroenterologist to clarify.  If you requested that your care partner not be given the details of your procedure findings, then the procedure report has been included in a sealed envelope for you to review at your convenience later.  YOU SHOULD EXPECT: Some feelings of bloating in the abdomen. Passage of more gas than usual.  Walking can help get rid of the air that was put into your GI tract during the procedure and reduce the bloating. If you had a lower endoscopy (such as a colonoscopy or flexible sigmoidoscopy) you may notice spotting of blood in your stool or on the toilet paper. If you underwent a bowel prep for your procedure, you may not have a normal bowel movement for a few days.  Please Note:  You might notice some irritation and congestion in your nose or some drainage.  This is from the oxygen used during your procedure.  There is no need for concern and it should clear up in a day or so.  SYMPTOMS TO REPORT IMMEDIATELY:   Following lower endoscopy (colonoscopy or flexible sigmoidoscopy):  Excessive amounts of blood in the stool  Significant tenderness or worsening of abdominal pains  Swelling of the abdomen that is new, acute  Fever of 100F or higher   For urgent or emergent issues, a gastroenterologist can be reached at any hour by calling [phone].   DIET:  We do recommend a small meal at first, but then you may proceed to your regular diet.  Drink plenty of fluids but you should avoid  alcoholic beverages for 24 hours.  ACTIVITY:  You should plan to take it easy for the rest of today and you should NOT DRIVE or use heavy machinery until tomorrow (because of the sedation medicines used during the test).    FOLLOW UP: Our staff will call the number listed on your records 48-72 hours following your procedure to check on you and address any questions or concerns that you may have regarding the information given to you following your procedure. If we do not reach you, we will leave a message.  We will attempt to reach you two times.  During this call, we will ask if you have developed any symptoms of COVID 19. If you develop any symptoms (ie: fever, flu-like symptoms, shortness of breath, cough etc.) before then, please call [phone].  If you test positive for Covid 19 in the 2 weeks post procedure, please call and report this information to [place].    If any biopsies were taken you will be contacted by phone or by letter within the next 1-3 weeks.  Please call us at [phone] if you have not heard about the biopsies in 3 weeks.    SIGNATURES/CONFIDENTIALITY: You and/or your care partner have signed paperwork which will be entered into your electronic medical record.  These signatures attest to the fact that that the information above on your After Visit Summary has been reviewed and is understood.  Full responsibility of the confidentiality of this  discharge information lies with you and/or your care-partner. 

## 2019-05-15 ENCOUNTER — Telehealth: Payer: Self-pay

## 2019-05-15 NOTE — Telephone Encounter (Signed)
Covid-19 screening questions   Do you now or have you had a fever in the last 14 days? No.  Do you have any respiratory symptoms of shortness of breath or cough now or in the last 14 days? No.  Do you have any family members or close contacts with diagnosed or suspected Covid-19 in the past 14 days?  Np.  Have you been tested for Covid-19 and found to be positive? No.       Follow up Call-  Call back number 05/11/2019  Post procedure Call Back phone  # [phone]  Permission to leave phone message Yes  Some recent data might be hidden     Patient questions:  Do you have a fever, pain , or abdominal swelling? No. Pain Score  0 *  Have you tolerated food without any problems? Yes.    Have you been able to return to your normal activities? Yes.    Do you have any questions about your discharge instructions: Diet   No. Medications  No. Follow up visit  No.  Do you have questions or concerns about your Care? No.  Actions: * If pain score is 4 or above: No action needed, pain <4.

## 2019-05-15 NOTE — Telephone Encounter (Signed)
Left message on follow up call. 

## 2019-05-21 ENCOUNTER — Encounter: Payer: Self-pay | Admitting: Gastroenterology

## 2019-06-08 ENCOUNTER — Other Ambulatory Visit: Payer: Self-pay

## 2019-06-08 ENCOUNTER — Other Ambulatory Visit: Payer: PRIVATE HEALTH INSURANCE

## 2019-06-08 DIAGNOSIS — E1169 Type 2 diabetes mellitus with other specified complication: Secondary | ICD-10-CM

## 2019-06-08 DIAGNOSIS — Z6831 Body mass index (BMI) 31.0-31.9, adult: Secondary | ICD-10-CM

## 2019-06-08 DIAGNOSIS — E785 Hyperlipidemia, unspecified: Secondary | ICD-10-CM

## 2019-06-08 DIAGNOSIS — E119 Type 2 diabetes mellitus without complications: Secondary | ICD-10-CM

## 2019-06-09 LAB — COMPLETE METABOLIC PANEL WITH GFR
AG Ratio: 1.9 (calc) (ref 1.0–2.5)
ALT: 16 U/L (ref 9–46)
AST: 17 U/L (ref 10–35)
Albumin: 4.2 g/dL (ref 3.6–5.1)
Alkaline phosphatase (APISO): 56 U/L (ref 35–144)
BUN: 15 mg/dL (ref 7–25)
CO2: 24 mmol/L (ref 20–32)
Calcium: 8.8 mg/dL (ref 8.6–10.3)
Chloride: 106 mmol/L (ref 98–110)
Creat: 0.86 mg/dL (ref 0.70–1.25)
GFR, Est African American: 107 mL/min/{1.73_m2} (ref >=60)
GFR, Est Non African American: 92 mL/min/{1.73_m2} (ref >=60)
Globulin: 2.2 g/dL (calc) (ref 1.9–3.7)
Glucose, Bld: 123 mg/dL — ABNORMAL HIGH (ref 65–99)
Potassium: 4.2 mmol/L (ref 3.5–5.3)
Sodium: 141 mmol/L (ref 135–146)
Total Bilirubin: 0.9 mg/dL (ref 0.2–1.2)
Total Protein: 6.4 g/dL (ref 6.1–8.1)

## 2019-06-09 LAB — HEMOGLOBIN A1C
Hgb A1c MFr Bld: 6.3 % of total Hgb — ABNORMAL HIGH (ref <5.7)
Mean Plasma Glucose: 134 (calc)
eAG (mmol/L): 7.4 (calc)

## 2019-06-09 LAB — CBC WITH DIFFERENTIAL/PLATELET
Absolute Monocytes: 383 cells/uL (ref 200–950)
Basophils Absolute: 31 cells/uL (ref 0–200)
Basophils Relative: 0.6 %
Eosinophils Absolute: 112 cells/uL (ref 15–500)
Eosinophils Relative: 2.2 %
HCT: 43.3 % (ref 38.5–50.0)
Hemoglobin: 14.7 g/dL (ref 13.2–17.1)
Lymphs Abs: 1199 cells/uL (ref 850–3900)
MCH: 30.1 pg (ref 27.0–33.0)
MCHC: 33.9 g/dL (ref 32.0–36.0)
MCV: 88.5 fL (ref 80.0–100.0)
MPV: 9 fL (ref 7.5–12.5)
Monocytes Relative: 7.5 %
Neutro Abs: 3376 cells/uL (ref 1500–7800)
Neutrophils Relative %: 66.2 %
Platelets: 279 10*3/uL (ref 140–400)
RBC: 4.89 10*6/uL (ref 4.20–5.80)
RDW: 12.7 % (ref 11.0–15.0)
Total Lymphocyte: 23.5 %
WBC: 5.1 10*3/uL (ref 3.8–10.8)

## 2019-06-09 LAB — LIPID PANEL
Cholesterol: 106 mg/dL (ref <200)
HDL: 45 mg/dL (ref >=40)
LDL Cholesterol (Calc): 46 mg/dL (calc)
Non-HDL Cholesterol (Calc): 61 mg/dL (calc) (ref <130)
Total CHOL/HDL Ratio: 2.4 (calc) (ref <5.0)
Triglycerides: 70 mg/dL (ref <150)

## 2019-06-11 NOTE — Progress Notes (Signed)
Cholesterol is at goal.  I will plan to check annually going forward. Hba1c unfortunately has gone up to 6.3 from 6.1 since we reduced his metformin to the approved dosage.  I was afraid that would happen.   Electrolytes, liver and kidneys are normal. Blood counts are normal.

## 2019-06-12 ENCOUNTER — Encounter: Payer: Self-pay | Admitting: *Deleted

## 2019-06-18 ENCOUNTER — Encounter: Payer: PRIVATE HEALTH INSURANCE | Admitting: Internal Medicine

## 2019-06-23 ENCOUNTER — Other Ambulatory Visit: Payer: Self-pay | Admitting: Internal Medicine

## 2019-06-23 DIAGNOSIS — E119 Type 2 diabetes mellitus without complications: Secondary | ICD-10-CM

## 2019-06-29 ENCOUNTER — Encounter: Payer: PRIVATE HEALTH INSURANCE | Admitting: Internal Medicine

## 2019-07-07 ENCOUNTER — Other Ambulatory Visit: Payer: Self-pay | Admitting: Internal Medicine

## 2019-07-09 NOTE — Telephone Encounter (Signed)
rx sent to pharmacy by e-script  

## 2019-07-27 ENCOUNTER — Encounter: Payer: Self-pay | Admitting: Internal Medicine

## 2019-07-27 ENCOUNTER — Ambulatory Visit (INDEPENDENT_AMBULATORY_CARE_PROVIDER_SITE_OTHER): Payer: PRIVATE HEALTH INSURANCE | Admitting: Internal Medicine

## 2019-07-27 ENCOUNTER — Other Ambulatory Visit: Payer: Self-pay

## 2019-07-27 ENCOUNTER — Telehealth: Payer: Self-pay

## 2019-07-27 VITALS — BP 122/74 | HR 86 | Temp 97.1°F | Wt 214.0 lb

## 2019-07-27 DIAGNOSIS — E1169 Type 2 diabetes mellitus with other specified complication: Secondary | ICD-10-CM | POA: Diagnosis not present

## 2019-07-27 DIAGNOSIS — E119 Type 2 diabetes mellitus without complications: Secondary | ICD-10-CM

## 2019-07-27 DIAGNOSIS — K5790 Diverticulosis of intestine, part unspecified, without perforation or abscess without bleeding: Secondary | ICD-10-CM

## 2019-07-27 DIAGNOSIS — G4733 Obstructive sleep apnea (adult) (pediatric): Secondary | ICD-10-CM | POA: Diagnosis not present

## 2019-07-27 DIAGNOSIS — R0981 Nasal congestion: Secondary | ICD-10-CM

## 2019-07-27 DIAGNOSIS — Z0000 Encounter for general adult medical examination without abnormal findings: Secondary | ICD-10-CM | POA: Diagnosis not present

## 2019-07-27 DIAGNOSIS — E785 Hyperlipidemia, unspecified: Secondary | ICD-10-CM

## 2019-07-27 DIAGNOSIS — L853 Xerosis cutis: Secondary | ICD-10-CM

## 2019-07-27 NOTE — Progress Notes (Signed)
Provider:  Rexene Edison. Mariea Clonts, D.O., C.M.D. Location:   [facility]   Place of Service:   clinic  Previous PCP: Gayland Curry, DO Patient Care Team: Gayland Curry, DO as PCP - General (Geriatric Medicine) Lavonna Monarch, MD as Consulting Physician (Dermatology) Leandrew Koyanagi, MD as Attending Physician (Orthopedic Surgery) Roel Cluck, MD as Referring Physician (Ophthalmology) Deneise Lever, MD as Consulting Physician (Pulmonary Disease)  Extended Emergency Contact Information Primary Emergency Contact: Szymanowski,Kathy Address: [address]          [place], [place] [address] Johnnette Litter of [place] Phone: [phone] Relation: Spouse  Goals of Care: Advanced Directive information Advanced Directives 07/27/2019  Does Patient Have a Medical Advance Directive? No  Copy of Healthcare Power of Attorney in Chart? -  Would patient like information on creating a medical advance directive? No - Patient declined   Chief Complaint  Patient presents with  . Annual Exam    annual physical     HPI: Patient is a 64 y.o. male seen today for an annual physical exam.  COVID is creating anxiety for all.  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  He is for repeat cscope in 5 years.  Had one pedunculated polyp in his rectum.  He also has diverticulosis.  High fiber diet was recommended.    He feels like he has congestion in his ears.  Thinks it's the effect of winter in and out of dry heat.  Had two sinus infections last winter.  Trying to avoid this--uses saline flushes before bed and in the morning.  If he gets congested at work, he takes a benadryl.  He does not get sleepy from it.    Labs from Dec reviewed.  LDL at goal.  hba1c 6.1 to 6.3.  He is trying to do more walking and be careful what he eats.  We'd reduced the metformin to the recommended/approved dosage.    Asked about allergy, derm visits and f/u  on cpap and we decided he can postpone these interventions until he is vaccinated.  We discussed that urgent or emergent things should still be pursued.  He is struggling with wearing his CPAP mask, but cannot pinpoint any specific issue with it.  He's going to try to be better about using it consistently.  Past Medical History:  Diagnosis Date  . Allergy   . Cancer ([facility])    skin cancer- squamous cell   . Diabetes mellitus without complication (HCC)    Type 2  . H/O lymph node excision [place] , [place]  . Hypertension   . Sleep apnea    wears cpap   . Viral pneumonia 1965   Past Surgical History:  Procedure Laterality Date  . COLONOSCOPY    . LYMPH NODE DISSECTION  1998  . perirectal abcess  1989  . POLYPECTOMY    . SKIN BIOPSY Right 05/21/15   forearm Robyne Askew PA  . WISDOM TOOTH EXTRACTION      reports that he has never smoked. He has never used smokeless tobacco. He reports current alcohol use of about 1.0 standard drinks of alcohol per week. He reports that he does not use drugs.  Functional Status Survey:    Family History  Problem Relation Age of Onset  . Hypertension Mother   . Diabetes Mother 60       Diabetes type 2  . Heart  disease Father 35       Heart Attack  . Colon cancer Neg Hx   . Colon polyps Neg Hx   . Esophageal cancer Neg Hx   . Rectal cancer Neg Hx   . Stomach cancer Neg Hx     Health Maintenance  Topic Date Due  . OPHTHALMOLOGY EXAM  04/21/2019  . FOOT EXAM  06/02/2019  . HEMOGLOBIN A1C  12/07/2019  . COLONOSCOPY  05/10/2022  . TETANUS/TDAP  09/20/2027  . INFLUENZA VACCINE  Completed  . PNEUMOCOCCAL POLYSACCHARIDE VACCINE AGE 68-64 HIGH RISK  Completed  . Hepatitis C Screening  Completed  . HIV Screening  Addressed    Allergies  Allergen Reactions  . Fish Allergy Swelling    Tuna fish--- Swelling of lips     Outpatient Encounter Medications as of 07/27/2019  Medication Sig  . aspirin 81 MG tablet Take 81 mg by mouth daily.    Marland Kitchen atorvastatin (LIPITOR) 20 MG tablet TAKE 1 TABLET BY MOUTH ONCE DAILY 6 IN THE EVENING  . benazepril-hydrochlorthiazide (LOTENSIN HCT) 20-12.5 MG tablet Take 1 tablet by mouth once daily  . Dextromethorphan-guaiFENesin (MUCINEX DM MAXIMUM STRENGTH PO) Take by mouth 2 (two) times daily after a meal.  . fluticasone (FLONASE) 50 MCG/ACT nasal spray Use 2 spray(s) in each nostril once daily  . guaiFENesin (MUCINEX) 600 MG 12 hr tablet Take 600 mg by mouth 2 (two) times daily.   Marland Kitchen levocetirizine (XYZAL) 5 MG tablet Take 5 mg by mouth every evening.  . metFORMIN (GLUCOPHAGE) 1000 MG tablet TAKE 1 TABLET BY MOUTH TWICE DAILY WITH MEALS  . psyllium (REGULOID) 0.52 G capsule Take 0.52 g by mouth daily.  . TRADJENTA 5 MG TABS tablet Take 1 tablet by mouth once daily  . TRULICITY [id number] 0000000 SOPN INJECT 1 PEN SUBCUTANEOUSLY ONCE A WEEK  . [DISCONTINUED] cetirizine (ZYRTEC) 10 MG tablet Take 10 mg by mouth as needed.    No facility-administered encounter medications on file as of 07/27/2019.    Review of Systems  Constitutional: Negative for chills, fever and malaise/fatigue.  HENT: Positive for congestion. Negative for hearing loss, sinus pain and sore throat.        Ear pressure  Eyes: Negative for blurred vision.  Respiratory: Negative for cough and shortness of breath.   Cardiovascular: Negative for chest pain, palpitations and leg swelling.  Gastrointestinal: Negative for abdominal pain, blood in stool, constipation, diarrhea, heartburn, melena, nausea and vomiting.  Genitourinary: Negative for dysuria, frequency and urgency.  Musculoskeletal: Positive for joint pain. Negative for back pain, falls, myalgias and neck pain.       Knee is a nuisance but not to a point where he wants another MRI and ortho f/u  Skin: Negative for itching and rash.       No new areas of skin concern on arms--will see derm when covid vaccinated  Neurological: Negative for dizziness, tingling, sensory change and  loss of consciousness.  Psychiatric/Behavioral: Negative for depression and memory loss. The patient is not nervous/anxious and does not have insomnia.        Does admit to some covid-related anxiety    Vitals:   07/27/19 0809  BP: 122/74  Pulse: 86  Temp: (!) 97.1 F (36.2 C)  SpO2: 90%  Weight: 214 lb (97.1 kg)   Body mass index is 32.54 kg/m. Physical Exam Vitals reviewed.  Constitutional:      General: He is not in acute distress.    Appearance: Normal appearance.  He is not toxic-appearing.  HENT:     Head: Normocephalic and atraumatic.     Right Ear: Tympanic membrane, ear canal and external ear normal.     Left Ear: Tympanic membrane, ear canal and external ear normal.     Ears:     Comments: Slight dullness of left TM, but both do have light reflex intact, no erythema    Nose: Congestion present.     Mouth/Throat:     Pharynx: Oropharynx is clear. No oropharyngeal exudate.  Eyes:     Conjunctiva/sclera: Conjunctivae normal.     Pupils: Pupils are equal, round, and reactive to light.  Cardiovascular:     Rate and Rhythm: Normal rate and regular rhythm.     Pulses: Normal pulses.     Heart sounds: Normal heart sounds.  Pulmonary:     Effort: Pulmonary effort is normal.     Breath sounds: Normal breath sounds. No wheezing, rhonchi or rales.  Abdominal:     General: Bowel sounds are normal. There is no distension.     Palpations: Abdomen is soft. There is no mass.     Tenderness: There is no abdominal tenderness. There is no guarding or rebound.     Hernia: No hernia is present.  Musculoskeletal:        General: Normal range of motion.     Cervical back: Neck supple.     Right lower leg: No edema.     Left lower leg: No edema.  Skin:    General: Skin is warm and dry.     Comments: Dry scaly skin with open areas on hands and right great toe from cracking open amid winter  Neurological:     General: No focal deficit present.     Mental Status: He is alert and  oriented to person, place, and time.     Cranial Nerves: No cranial nerve deficit.     Sensory: No sensory deficit.     Motor: No weakness.     Coordination: Coordination normal.     Gait: Gait normal.     Deep Tendon Reflexes: Reflexes normal.  Psychiatric:        Mood and Affect: Mood normal.        Behavior: Behavior normal.        Thought Content: Thought content normal.        Judgment: Judgment normal.     Labs reviewed: Basic Metabolic Panel: Recent Labs    02/08/19 0822 06/08/19 0815  NA 141 141  K 3.9 4.2  CL 105 106  CO2 28 24  GLUCOSE 118* 123*  BUN 15 15  CREATININE 0.88 0.86  CALCIUM 9.3 8.8   Liver Function Tests: Recent Labs    02/08/19 0822 06/08/19 0815  AST 17 17  ALT 15 16  BILITOT 1.2 0.9  PROT 6.0* 6.4   No results for input(s): LIPASE, AMYLASE in the last 8760 hours. No results for input(s): AMMONIA in the last 8760 hours. CBC: Recent Labs    02/08/19 0822 06/08/19 0815  WBC 5.2 5.1  NEUTROABS 3,396 3,376  HGB 13.8 14.7  HCT 40.7 43.3  MCV 88.7 88.5  PLT 277 279   Cardiac Enzymes: No results for input(s): CKTOTAL, CKMB, CKMBINDEX, TROPONINI in the last 8760 hours. BNP: Invalid input(s): POCBNP Lab Results  Component Value Date   HGBA1C 6.3 (H) 06/08/2019    Assessment/Plan 1. Annual physical exam - performed today including diabetic foot exam - CBC with  Differential/Platelet; Future - COMPLETE METABOLIC PANEL WITH GFR; Future - Lipid panel; Future - Hemoglobin A1c; Future  2. Hyperlipidemia associated with type 2 diabetes mellitus ([facility]) - at goal, cont lipitor 20mg   - Lipid panel; Future  3. Mild obstructive sleep apnea -cont CPAP and use faithfully--if problems detected, f/u with Dr. Annamaria Boots, et al  4. Controlled type 2 diabetes mellitus without complication, without long-term current use of insulin ([place]) - control worsened (but still great) when metformin decreased to usual recommended dose and holiday/covid  times -he is trying to walk more and follow a healthy diet - Hemoglobin A1c; Future  5. Diverticulosis -cont high fiber diet, hydration - CBC with Differential/Platelet; Future -next cscope in 5 years with [facility]  6. Xerosis cutis -recommended using his working hands and feet cream on hands and all over feet not just on heels due to skin breakdown on great toe noted (to prevent breakdown)  7. Sinus congestion -appears to have a little fluid behind left TM related to this -cont nettipot bid and nasal spray  Labs/tests ordered:   Lab Orders     CBC with Differential/Platelet     COMPLETE METABOLIC PANEL WITH GFR     Lipid panel     Hemoglobin A1c  Needs routine EKG at annual in 2022 (last in 2019) F/u in 4 mos for med mgt, fasting labs as above before  Jayel Scaduto L. Porschea Borys, D.O. [facility] [address], [place] [address] Cell Phone (Mon-Fri 8am-5pm):  [phone] On Call:  [phone] & follow prompts after 5pm & weekends Office Phone:  [phone] Office Fax:  [phone]

## 2019-07-27 NOTE — Telephone Encounter (Signed)
[facility] was called I spoke with Lewis Shock last eye exam was April 23, 2019

## 2019-07-27 NOTE — Telephone Encounter (Signed)
Are they going to fax it to [place]?

## 2019-08-03 NOTE — Telephone Encounter (Signed)
When reviewing open encounters I seen that this encounter is still open from 7 days ago.  Dr.Reed have you seen this report since asking if it was going to faxed, if not [facility] Patient will need to call back and ask for a copy to be sent.

## 2019-08-06 NOTE — Telephone Encounter (Signed)
Melvin Cluck, MD Referring Physician Ophthalmology 10/02/2015 End  10/02/15  Phone: [phone]     Called Dr.Tepedino's office at the number above and requested eye exam. Per receptionist report will be sent right over

## 2019-08-06 NOTE — Telephone Encounter (Signed)
Please call and ask them to fax again.

## 2019-08-07 ENCOUNTER — Other Ambulatory Visit: Payer: Self-pay | Admitting: Internal Medicine

## 2019-08-10 ENCOUNTER — Encounter: Payer: Self-pay | Admitting: Internal Medicine

## 2019-09-01 ENCOUNTER — Other Ambulatory Visit: Payer: Self-pay | Admitting: Internal Medicine

## 2019-09-03 NOTE — Telephone Encounter (Signed)
rx sent to pharmacy by e-script  

## 2019-10-06 ENCOUNTER — Other Ambulatory Visit: Payer: Self-pay | Admitting: Internal Medicine

## 2019-10-08 NOTE — Telephone Encounter (Signed)
rx sent to pharmacy by e-script  

## 2019-10-14 ENCOUNTER — Other Ambulatory Visit: Payer: Self-pay | Admitting: Internal Medicine

## 2019-10-14 DIAGNOSIS — E119 Type 2 diabetes mellitus without complications: Secondary | ICD-10-CM

## 2019-10-15 NOTE — Telephone Encounter (Signed)
rx sent to pharmacy by e-script  

## 2019-10-27 ENCOUNTER — Other Ambulatory Visit: Payer: Self-pay | Admitting: Internal Medicine

## 2019-10-27 DIAGNOSIS — I10 Essential (primary) hypertension: Secondary | ICD-10-CM

## 2019-10-27 DIAGNOSIS — E785 Hyperlipidemia, unspecified: Secondary | ICD-10-CM

## 2019-10-29 NOTE — Telephone Encounter (Signed)
rx sent to pharmacy by e-script  

## 2019-11-03 ENCOUNTER — Other Ambulatory Visit: Payer: Self-pay | Admitting: Internal Medicine

## 2019-11-17 ENCOUNTER — Other Ambulatory Visit: Payer: Self-pay | Admitting: Internal Medicine

## 2019-11-26 ENCOUNTER — Other Ambulatory Visit: Payer: Self-pay

## 2019-11-26 ENCOUNTER — Other Ambulatory Visit: Payer: PRIVATE HEALTH INSURANCE

## 2019-11-26 DIAGNOSIS — Z0000 Encounter for general adult medical examination without abnormal findings: Secondary | ICD-10-CM

## 2019-11-26 DIAGNOSIS — E1169 Type 2 diabetes mellitus with other specified complication: Secondary | ICD-10-CM

## 2019-11-26 DIAGNOSIS — K5790 Diverticulosis of intestine, part unspecified, without perforation or abscess without bleeding: Secondary | ICD-10-CM

## 2019-11-26 DIAGNOSIS — E119 Type 2 diabetes mellitus without complications: Secondary | ICD-10-CM

## 2019-11-27 LAB — COMPLETE METABOLIC PANEL WITH GFR
AG Ratio: 2 (calc) (ref 1.0–2.5)
ALT: 15 U/L (ref 9–46)
AST: 14 U/L (ref 10–35)
Albumin: 3.9 g/dL (ref 3.6–5.1)
Alkaline phosphatase (APISO): 55 U/L (ref 35–144)
BUN: 15 mg/dL (ref 7–25)
CO2: 24 mmol/L (ref 20–32)
Calcium: 8.6 mg/dL (ref 8.6–10.3)
Chloride: 109 mmol/L (ref 98–110)
Creat: 0.84 mg/dL (ref 0.70–1.25)
GFR, Est African American: 108 mL/min/{1.73_m2} (ref >=60)
GFR, Est Non African American: 93 mL/min/{1.73_m2} (ref >=60)
Globulin: 2 g/dL (calc) (ref 1.9–3.7)
Glucose, Bld: 119 mg/dL — ABNORMAL HIGH (ref 65–99)
Potassium: 3.8 mmol/L (ref 3.5–5.3)
Sodium: 140 mmol/L (ref 135–146)
Total Bilirubin: 1 mg/dL (ref 0.2–1.2)
Total Protein: 5.9 g/dL — ABNORMAL LOW (ref 6.1–8.1)

## 2019-11-27 LAB — LIPID PANEL
Cholesterol: 102 mg/dL (ref <200)
HDL: 40 mg/dL (ref >=40)
LDL Cholesterol (Calc): 49 mg/dL (calc)
Non-HDL Cholesterol (Calc): 62 mg/dL (calc) (ref <130)
Total CHOL/HDL Ratio: 2.6 (calc) (ref <5.0)
Triglycerides: 58 mg/dL (ref <150)

## 2019-11-27 LAB — CBC WITH DIFFERENTIAL/PLATELET
Absolute Monocytes: 410 cells/uL (ref 200–950)
Basophils Absolute: 40 cells/uL (ref 0–200)
Basophils Relative: 0.7 %
Eosinophils Absolute: 103 cells/uL (ref 15–500)
Eosinophils Relative: 1.8 %
HCT: 40.4 % (ref 38.5–50.0)
Hemoglobin: 13.9 g/dL (ref 13.2–17.1)
Lymphs Abs: 1220 cells/uL (ref 850–3900)
MCH: 30.9 pg (ref 27.0–33.0)
MCHC: 34.4 g/dL (ref 32.0–36.0)
MCV: 89.8 fL (ref 80.0–100.0)
MPV: 9 fL (ref 7.5–12.5)
Monocytes Relative: 7.2 %
Neutro Abs: 3927 cells/uL (ref 1500–7800)
Neutrophils Relative %: 68.9 %
Platelets: 252 10*3/uL (ref 140–400)
RBC: 4.5 10*6/uL (ref 4.20–5.80)
RDW: 12.7 % (ref 11.0–15.0)
Total Lymphocyte: 21.4 %
WBC: 5.7 10*3/uL (ref 3.8–10.8)

## 2019-11-27 LAB — HEMOGLOBIN A1C
Hgb A1c MFr Bld: 6.3 % of total Hgb — ABNORMAL HIGH (ref <5.7)
Mean Plasma Glucose: 134 (calc)
eAG (mmol/L): 7.4 (calc)

## 2019-11-27 NOTE — Progress Notes (Signed)
Sugar average and cholesterol are stable.  Other labs are all fine.

## 2019-11-29 ENCOUNTER — Other Ambulatory Visit: Payer: Self-pay

## 2019-11-29 ENCOUNTER — Ambulatory Visit (INDEPENDENT_AMBULATORY_CARE_PROVIDER_SITE_OTHER): Payer: PRIVATE HEALTH INSURANCE | Admitting: Internal Medicine

## 2019-11-29 ENCOUNTER — Encounter: Payer: Self-pay | Admitting: Internal Medicine

## 2019-11-29 VITALS — BP 142/84 | HR 84 | Temp 97.0°F | Ht 68.0 in | Wt 214.0 lb

## 2019-11-29 DIAGNOSIS — E1169 Type 2 diabetes mellitus with other specified complication: Secondary | ICD-10-CM

## 2019-11-29 DIAGNOSIS — E785 Hyperlipidemia, unspecified: Secondary | ICD-10-CM

## 2019-11-29 DIAGNOSIS — Z6831 Body mass index (BMI) 31.0-31.9, adult: Secondary | ICD-10-CM

## 2019-11-29 DIAGNOSIS — J3089 Other allergic rhinitis: Secondary | ICD-10-CM | POA: Diagnosis not present

## 2019-11-29 DIAGNOSIS — E6609 Other obesity due to excess calories: Secondary | ICD-10-CM

## 2019-11-29 DIAGNOSIS — S83241D Other tear of medial meniscus, current injury, right knee, subsequent encounter: Secondary | ICD-10-CM

## 2019-11-29 DIAGNOSIS — Z6832 Body mass index (BMI) 32.0-32.9, adult: Secondary | ICD-10-CM

## 2019-11-29 NOTE — Progress Notes (Signed)
Location:  [facility] clinic Provider:  Mikaiya Tramble L. Mariea Clonts, D.O., C.M.D.  Goals of Care:  Advanced Directives 11/29/2019  Does Patient Have a Medical Advance Directive? No  Copy of Healthcare Power of Attorney in Chart? -  Would patient like information on creating a medical advance directive? -   Chief Complaint  Patient presents with  . Medical Management of Chronic Issues    4 month follow up / Rt knee pain     HPI: Patient is a 64 y.o. male seen today for medical management of chronic diseases.    He has concerns about other folks not masking who may or may not be vaccinated.  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    Reviewed labs with stable hba1c and lipids at goal.    His right knee is acting up more.  Hasn't been to orthopedics for 3-4 years.  Had seen Dr. Erlinda Hong before.  If he sits for a while, he has to get up slowly.  Takes him a while to limber up.  Does not have constant pain.  He feels it when he first gets going.  He's still able to go up and down a step ladder.  He's very careful.  He's still doing some walking for exercise.  Meniscus tear could not be repaired, he was told before.  He's not ready for a TKA and all that goes with it.  Had a cortisone shot.    He needs to return to derm for a skin exam.  He'd had light treatment 11/19, then was to have f/u in April '20 but canceled due to covid.  He's trying to get back into using his cpap.  It's a slow process.  He does not do well with it.   He is a little more congested since the humidity kicked it.    Past Medical History:  Diagnosis Date  . Allergy   . Cancer ([place])    skin cancer- squamous cell   . Diabetes mellitus without complication (HCC)    Type 2  . H/O lymph node excision [place] , [place]  . Hypertension   . Sleep apnea    wears cpap   . Viral pneumonia 1965    Past Surgical History:  Procedure Laterality Date  . COLONOSCOPY    . LYMPH NODE DISSECTION  1998  . perirectal abcess  1989   . POLYPECTOMY    . SKIN BIOPSY Right 05/21/15   forearm Robyne Askew PA  . WISDOM TOOTH EXTRACTION      Allergies  Allergen Reactions  . Fish Allergy Swelling    Tuna fish--- Swelling of lips     Outpatient Encounter Medications as of 11/29/2019  Medication Sig  . aspirin 81 MG tablet Take 81 mg by mouth daily.  Marland Kitchen atorvastatin (LIPITOR) 20 MG tablet TAKE 1 TABLET BY MOUTH ONCE DAILY IN THE EVENING AT  6  PM  . benazepril-hydrochlorthiazide (LOTENSIN HCT) 20-12.5 MG tablet Take 1 tablet by mouth once daily  . Dextromethorphan-guaiFENesin (MUCINEX DM MAXIMUM STRENGTH PO) Take by mouth 2 (two) times daily after a meal.  . fluticasone (FLONASE) 50 MCG/ACT nasal spray Use 2 spray(s) in each nostril once daily  . guaiFENesin (MUCINEX) 600 MG 12 hr tablet Take 600 mg by mouth 2 (two) times daily.   Marland Kitchen levocetirizine (XYZAL) 5 MG tablet Take 5 mg by mouth every evening.  . metFORMIN (GLUCOPHAGE) 1000 MG tablet TAKE 1 TABLET BY MOUTH TWICE  DAILY WITH MEALS  . psyllium (REGULOID) 0.52 G capsule Take 0.52 g by mouth daily.  . TRADJENTA 5 MG TABS tablet Take 1 tablet by mouth once daily  . TRULICITY 4.09 WJ/1.9JY SOPN INJECT 1 PEN SUBCUTANEOUSLY ONCE A WEEK   No facility-administered encounter medications on file as of 11/29/2019.    Review of Systems:  Review of Systems  Constitutional: Negative for chills, fever and malaise/fatigue.  HENT: Positive for congestion. Negative for hearing loss.   Eyes: Negative for blurred vision.  Respiratory: Negative for cough and shortness of breath.   Cardiovascular: Negative for chest pain, palpitations and leg swelling.  Gastrointestinal: Negative for abdominal pain, blood in stool, constipation, diarrhea and melena.  Genitourinary: Negative for dysuria.  Musculoskeletal: Positive for joint pain. Negative for falls.  Skin: Negative for itching and rash.  Neurological: Negative for dizziness and loss of consciousness.  Endo/Heme/Allergies: Does not  bruise/bleed easily.  Psychiatric/Behavioral: Negative for depression and memory loss. The patient is not nervous/anxious and does not have insomnia.     Health Maintenance  Topic Date Due  . COVID-19 Vaccine (1) Never done  . INFLUENZA VACCINE  01/20/2020  . OPHTHALMOLOGY EXAM  04/22/2020  . HEMOGLOBIN A1C  05/27/2020  . FOOT EXAM  07/26/2020  . COLONOSCOPY  05/10/2022  . TETANUS/TDAP  09/20/2027  . PNEUMOCOCCAL POLYSACCHARIDE VACCINE AGE 67-64 HIGH RISK  Completed  . Hepatitis C Screening  Completed  . HIV Screening  Addressed    Physical Exam: Vitals:   11/29/19 0809  Weight: 214 lb (97.1 kg)  Height: 5\' 8"  (1.727 m)   Body mass index is 32.54 kg/m. Physical Exam Vitals reviewed.  Constitutional:      Appearance: Normal appearance. He is obese.  HENT:     Head: Normocephalic and atraumatic.  Cardiovascular:     Rate and Rhythm: Normal rate and regular rhythm.     Pulses: Normal pulses.     Heart sounds: Normal heart sounds.  Pulmonary:     Effort: Pulmonary effort is normal.     Breath sounds: Wheezing present. No rhonchi or rales.  Musculoskeletal:        General: Normal range of motion.     Right lower leg: No edema.     Left lower leg: No edema.  Skin:    General: Skin is warm and dry.  Neurological:     General: No focal deficit present.     Mental Status: He is alert and oriented to person, place, and time.     Labs reviewed: Basic Metabolic Panel: Recent Labs    02/08/19 0822 06/08/19 0815 11/26/19 0823  NA 141 141 140  K 3.9 4.2 3.8  CL 105 106 109  CO2 28 24 24   GLUCOSE 118* 123* 119*  BUN 15 15 15   CREATININE 0.88 0.86 0.84  CALCIUM 9.3 8.8 8.6   Liver Function Tests: Recent Labs    02/08/19 0822 06/08/19 0815 11/26/19 0823  AST 17 17 14   ALT 15 16 15   BILITOT 1.2 0.9 1.0  PROT 6.0* 6.4 5.9*   No results for input(s): LIPASE, AMYLASE in the last 8760 hours. No results for input(s): AMMONIA in the last 8760 hours. CBC: Recent  Labs    02/08/19 0822 06/08/19 0815 11/26/19 0823  WBC 5.2 5.1 5.7  NEUTROABS 3,396 3,376 3,927  HGB 13.8 14.7 13.9  HCT 40.7 43.3 40.4  MCV 88.7 88.5 89.8  PLT 277 279 252   Lipid Panel: Recent Labs  02/08/19 0822 06/08/19 0815 11/26/19 0823  CHOL 96 106 102  HDL 43 45 40  LDLCALC 40 46 49  TRIG 53 70 58  CHOLHDL 2.2 2.4 2.6   Lab Results  Component Value Date   HGBA1C 6.3 (H) 11/26/2019    Assessment/Plan 1. Type 2 diabetes mellitus with other specified complication, without long-term current use of insulin (HCC) - well-controlled, cont same regimen with metformin, tradjenta and trulicity - CBC with Differential/Platelet; Future - BASIC METABOLIC PANEL WITH GFR; Future - Hemoglobin A1c; Future - Lipid panel; Future  2. Hyperlipidemia associated with type 2 diabetes mellitus ([place]) -cont lipitor--at goal - Lipid panel; Future  3. Environmental and seasonal allergies -cont xyzal and flonase  4. Class 1 obesity due to excess calories with serious comorbidity and body mass index (BMI) of 31.0 to 31.9 in adult -cont to walk, modified diet  5. Body mass index 32.0-32.9, adult -cont exercise and modified diet  6. Tear of medial meniscus of right knee, unspecified tear type, unspecified whether old or current tear, subsequent encounter -he wants to hold off on any further imaging or surgery at this time, cont to monitor  Labs/tests ordered:   Lab Orders     CBC with Differential/Platelet     BASIC METABOLIC PANEL WITH GFR     Hemoglobin A1c     Lipid panel  Next appt:  4 mos med mgt, fasting labs before   Alia Parsley L. Walther Sanagustin, D.O. [facility] [address], [place] [address] Cell Phone (Mon-Fri 8am-5pm):  [phone] On Call:  [phone] & follow prompts after 5pm & weekends Office Phone:  [phone] Office Fax:  [phone]

## 2019-11-30 ENCOUNTER — Ambulatory Visit: Payer: PRIVATE HEALTH INSURANCE | Admitting: Internal Medicine

## 2019-12-07 ENCOUNTER — Other Ambulatory Visit: Payer: Self-pay | Admitting: Internal Medicine

## 2019-12-15 ENCOUNTER — Other Ambulatory Visit: Payer: Self-pay | Admitting: Internal Medicine

## 2019-12-29 ENCOUNTER — Other Ambulatory Visit: Payer: Self-pay | Admitting: Internal Medicine

## 2019-12-31 NOTE — Telephone Encounter (Signed)
Refill is to soon last filled on 12/17/2019

## 2020-01-02 ENCOUNTER — Other Ambulatory Visit: Payer: Self-pay | Admitting: Internal Medicine

## 2020-01-02 ENCOUNTER — Other Ambulatory Visit: Payer: Self-pay

## 2020-01-02 MED ORDER — LINAGLIPTIN 5 MG PO TABS: 5.0000 mg | ORAL_TABLET | Freq: Every day | ORAL | 0 refills | Status: DC

## 2020-01-03 MED ORDER — TRULICITY 0.75 MG/0.5ML ~~LOC~~ SOAJ: SUBCUTANEOUS | 5 refills | Status: DC

## 2020-01-04 ENCOUNTER — Encounter: Payer: Self-pay | Admitting: Internal Medicine

## 2020-01-04 ENCOUNTER — Telehealth: Payer: Self-pay

## 2020-01-04 ENCOUNTER — Other Ambulatory Visit: Payer: Self-pay | Admitting: Internal Medicine

## 2020-01-04 DIAGNOSIS — E1169 Type 2 diabetes mellitus with other specified complication: Secondary | ICD-10-CM

## 2020-01-04 MED ORDER — SITAGLIPTIN PHOSPHATE 100 MG PO TABS: 100.0000 mg | ORAL_TABLET | Freq: Every day | ORAL | 3 refills | Status: DC

## 2020-01-04 NOTE — Telephone Encounter (Signed)
I have sent in Januvia 100mg  in place of tradjenta 5mg , but we need to know what he has to try before they will cover the trulicity (which he's been on for sometime and has been working quite well for him).  Please get this information for me.

## 2020-01-04 NOTE — Telephone Encounter (Signed)
Message Routed to Dr. Reed 

## 2020-01-04 NOTE — Telephone Encounter (Signed)
Needs to be tried on Januvia first if not going to do prior authorization for Tradjenta.

## 2020-01-04 NOTE — Telephone Encounter (Signed)
Pharmacy called and said that the script needed a PA, but was sent in as a refill. Hildred Alamin was the techs name the phone number is [phone]. Please advise.  The medication that was called in by me on 06/05/2445 was trulicity that was received in the inbasket as a refill.  Called pharmacy back to see what medication they were talking about. Spoke with [place] and she explained that the Trulicity is $950 with insurance. She stated that she would have patient see if he could request the discount coupon. Then she stated that the Tradjenta will not be covered by his insurance until he tries Januvia fi.rst There is a discount coupon for the Januvia of $150 , but will not know how much it actually is until prescription sent in for Januvia. Please advise.

## 2020-01-04 NOTE — Telephone Encounter (Signed)
Called pharmacy and found out they were talking about the [place]. Januvia needs to be used first if not going to go ahead and obtain prior authorization. The trulicity (which was requested for refill throught our inbasket) will cost patient [phone] unless patient can get coupon. Spoke with [place]

## 2020-01-04 NOTE — Telephone Encounter (Signed)
Pharmacy called and said that the script needed a PA, but was sent in as a refill. Hildred Alamin was the techs name the phone number is [phone]. Please advise.

## 2020-01-07 NOTE — Telephone Encounter (Signed)
[facility] and spoke with Sheran Spine, he stated that patient does not need to try anything before using Trulicity. He will be faxing this information over with insurance company information if we would like to call them.

## 2020-01-12 ENCOUNTER — Other Ambulatory Visit: Payer: Self-pay | Admitting: Internal Medicine

## 2020-01-12 DIAGNOSIS — E119 Type 2 diabetes mellitus without complications: Secondary | ICD-10-CM

## 2020-01-14 ENCOUNTER — Telehealth: Payer: Self-pay | Admitting: *Deleted

## 2020-01-14 NOTE — Telephone Encounter (Signed)
[name] with [name] called requesting Prior Authorization through Cover My Meds. For Trulicity.   Initiated through [employer] Key: [place] into determination.  Awaiting response.  ID: [id number] Grp: [id number] BIN: [id number]

## 2020-01-21 ENCOUNTER — Encounter: Payer: Self-pay | Admitting: Internal Medicine

## 2020-01-21 DIAGNOSIS — E1169 Type 2 diabetes mellitus with other specified complication: Secondary | ICD-10-CM

## 2020-01-21 MED ORDER — TRULICITY 0.75 MG/0.5ML ~~LOC~~ SOAJ: SUBCUTANEOUS | 5 refills | Status: DC

## 2020-01-26 ENCOUNTER — Other Ambulatory Visit: Payer: Self-pay | Admitting: Internal Medicine

## 2020-01-26 DIAGNOSIS — I10 Essential (primary) hypertension: Secondary | ICD-10-CM

## 2020-01-26 DIAGNOSIS — E785 Hyperlipidemia, unspecified: Secondary | ICD-10-CM

## 2020-02-09 ENCOUNTER — Other Ambulatory Visit: Payer: Self-pay | Admitting: Internal Medicine

## 2020-03-08 ENCOUNTER — Other Ambulatory Visit: Payer: Self-pay | Admitting: Internal Medicine

## 2020-03-27 ENCOUNTER — Other Ambulatory Visit: Payer: Self-pay

## 2020-03-27 ENCOUNTER — Other Ambulatory Visit: Payer: PRIVATE HEALTH INSURANCE

## 2020-03-27 DIAGNOSIS — E1169 Type 2 diabetes mellitus with other specified complication: Secondary | ICD-10-CM | POA: Diagnosis not present

## 2020-03-27 DIAGNOSIS — E785 Hyperlipidemia, unspecified: Secondary | ICD-10-CM | POA: Diagnosis not present

## 2020-03-28 LAB — LIPID PANEL
Cholesterol: 105 mg/dL (ref <200)
HDL: 43 mg/dL (ref >=40)
LDL Cholesterol (Calc): 48 mg/dL (calc)
Non-HDL Cholesterol (Calc): 62 mg/dL (calc) (ref <130)
Total CHOL/HDL Ratio: 2.4 (calc) (ref <5.0)
Triglycerides: 64 mg/dL (ref <150)

## 2020-03-28 LAB — HEMOGLOBIN A1C
Hgb A1c MFr Bld: 6.5 % of total Hgb — ABNORMAL HIGH (ref <5.7)
Mean Plasma Glucose: 140 (calc)
eAG (mmol/L): 7.7 (calc)

## 2020-03-28 LAB — CBC WITH DIFFERENTIAL/PLATELET
Absolute Monocytes: 423 cells/uL (ref 200–950)
Basophils Absolute: 32 cells/uL (ref 0–200)
Basophils Relative: 0.7 %
Eosinophils Absolute: 110 cells/uL (ref 15–500)
Eosinophils Relative: 2.4 %
HCT: 42.6 % (ref 38.5–50.0)
Hemoglobin: 14.7 g/dL (ref 13.2–17.1)
Lymphs Abs: 1155 cells/uL (ref 850–3900)
MCH: 30.6 pg (ref 27.0–33.0)
MCHC: 34.5 g/dL (ref 32.0–36.0)
MCV: 88.6 fL (ref 80.0–100.0)
MPV: 9.2 fL (ref 7.5–12.5)
Monocytes Relative: 9.2 %
Neutro Abs: 2880 cells/uL (ref 1500–7800)
Neutrophils Relative %: 62.6 %
Platelets: 257 10*3/uL (ref 140–400)
RBC: 4.81 10*6/uL (ref 4.20–5.80)
RDW: 13 % (ref 11.0–15.0)
Total Lymphocyte: 25.1 %
WBC: 4.6 10*3/uL (ref 3.8–10.8)

## 2020-03-28 LAB — BASIC METABOLIC PANEL WITH GFR
BUN: 15 mg/dL (ref 7–25)
CO2: 24 mmol/L (ref 20–32)
Calcium: 9 mg/dL (ref 8.6–10.3)
Chloride: 107 mmol/L (ref 98–110)
Creat: 0.76 mg/dL (ref 0.70–1.25)
GFR, Est African American: 112 mL/min/{1.73_m2} (ref >=60)
GFR, Est Non African American: 96 mL/min/{1.73_m2} (ref >=60)
Glucose, Bld: 130 mg/dL — ABNORMAL HIGH (ref 65–99)
Potassium: 3.8 mmol/L (ref 3.5–5.3)
Sodium: 141 mmol/L (ref 135–146)

## 2020-03-28 NOTE — Progress Notes (Signed)
Cholesterol is still good Sugar average (a1c) trended up to 6.5.  still good, but we don't want the trend to continue Electrolytes, liver, kidneys and blood counts are all normal Sent in [place]

## 2020-03-31 ENCOUNTER — Ambulatory Visit (INDEPENDENT_AMBULATORY_CARE_PROVIDER_SITE_OTHER): Payer: PRIVATE HEALTH INSURANCE | Admitting: Internal Medicine

## 2020-03-31 ENCOUNTER — Encounter: Payer: Self-pay | Admitting: Internal Medicine

## 2020-03-31 ENCOUNTER — Other Ambulatory Visit: Payer: Self-pay

## 2020-03-31 VITALS — BP 122/78 | HR 79 | Temp 97.7°F | Ht 68.0 in | Wt 214.4 lb

## 2020-03-31 DIAGNOSIS — E785 Hyperlipidemia, unspecified: Secondary | ICD-10-CM

## 2020-03-31 DIAGNOSIS — Z6832 Body mass index (BMI) 32.0-32.9, adult: Secondary | ICD-10-CM

## 2020-03-31 DIAGNOSIS — J3089 Other allergic rhinitis: Secondary | ICD-10-CM

## 2020-03-31 DIAGNOSIS — E6609 Other obesity due to excess calories: Secondary | ICD-10-CM | POA: Diagnosis not present

## 2020-03-31 DIAGNOSIS — E1169 Type 2 diabetes mellitus with other specified complication: Secondary | ICD-10-CM

## 2020-03-31 DIAGNOSIS — Z23 Encounter for immunization: Secondary | ICD-10-CM

## 2020-03-31 NOTE — Progress Notes (Signed)
Location:  [facility] clinic Provider:  Sianna Garofano L. Mariea Clonts, D.O., C.M.D.  Goals of Care:  Advanced Directives 03/31/2020  Does Patient Have a Medical Advance Directive? No  Copy of Healthcare Power of Attorney in Chart? -  Would patient like information on creating a medical advance directive? No - Patient declined   Chief Complaint  Patient presents with  . Medical Management of Chronic Issues    4 month follow up   . Acute Visit    congestion behind ears   . Health Maintenance    influenza,     HPI: Patient is a 64 y.o. male seen today for medical management of chronic diseases.    He c/o congestion behind his ears.  He's guessing it's his allergies.  Occasionally, with weather changes, he has had more congestion.  He is taking extra strength mucinex which has been alright.  He's trying to avoid doctor's offices.  It has been clearing.    His sugar average trended up some.  He'd been taking pretzels for snacks at work.  He notes it's usually some diet thing.  He was also occasionally having ice cream.  He's now cut that out since his lab returned.  He's got to reign it back in.  He has not changed his exercise program.    Weight is the same.  He notes he had some covid pounds--5 of them or so.    He got his flu shot.    Past Medical History:  Diagnosis Date  . Allergy   . Cancer ([place])    skin cancer- squamous cell   . Diabetes mellitus without complication (HCC)    Type 2  . H/O lymph node excision [place] , [place]  . Hypertension   . Sleep apnea    wears cpap   . Viral pneumonia 1965    Past Surgical History:  Procedure Laterality Date  . COLONOSCOPY    . LYMPH NODE DISSECTION  1998  . perirectal abcess  1989  . POLYPECTOMY    . SKIN BIOPSY Right 05/21/15   forearm Robyne Askew PA  . WISDOM TOOTH EXTRACTION      Allergies  Allergen Reactions  . Fish Allergy Swelling    Tuna fish--- Swelling of lips     Outpatient Encounter Medications as of 03/31/2020    Medication Sig  . aspirin 81 MG tablet Take 81 mg by mouth daily.  Marland Kitchen atorvastatin (LIPITOR) 20 MG tablet TAKE 1 TABLET BY MOUTH ONCE DAILY IN THE EVENING AT  6  PM  . benazepril-hydrochlorthiazide (LOTENSIN HCT) 20-12.5 MG tablet Take 1 tablet by mouth once daily  . Dextromethorphan-guaiFENesin (MUCINEX DM MAXIMUM STRENGTH PO) Take by mouth 2 (two) times daily after a meal.  . Dulaglutide (TRULICITY) 1.93 XT/0.2IO SOPN INJECT 1 PEN SUBCUTANEOUSLY ONCE A WEEK  . fluticasone (FLONASE) 50 MCG/ACT nasal spray Use 2 spray(s) in each nostril once daily  . levocetirizine (XYZAL) 5 MG tablet Take 5 mg by mouth every evening.  . metFORMIN (GLUCOPHAGE) 1000 MG tablet TAKE 1 TABLET BY MOUTH TWICE DAILY WITH MEALS  . psyllium (REGULOID) 0.52 G capsule Take 0.52 g by mouth daily.  . sitaGLIPtin (JANUVIA) 100 MG tablet Take 1 tablet (100 mg total) by mouth daily. With largest meal  . [DISCONTINUED] guaiFENesin (MUCINEX) 600 MG 12 hr tablet Take 600 mg by mouth 2 (two) times daily.    No facility-administered encounter medications on file as of 03/31/2020.    Review of  Systems:  Review of Systems  Constitutional: Negative for chills, fever and malaise/fatigue.  HENT: Positive for congestion and ear pain. Negative for hearing loss, sinus pain and sore throat.   Eyes: Negative for blurred vision.  Respiratory: Negative for cough and shortness of breath.   Cardiovascular: Negative for chest pain, palpitations and leg swelling.  Gastrointestinal: Negative for abdominal pain, blood in stool, constipation, diarrhea and melena.  Genitourinary: Negative for dysuria.  Musculoskeletal: Positive for joint pain. Negative for falls.  Skin: Negative for itching and rash.  Neurological: Negative for dizziness and loss of consciousness.  Endo/Heme/Allergies: Positive for environmental allergies.  Psychiatric/Behavioral: Negative for depression and memory loss. The patient is not nervous/anxious and does not have  insomnia.     Health Maintenance  Topic Date Due  . INFLUENZA VACCINE  01/20/2020  . OPHTHALMOLOGY EXAM  04/22/2020  . FOOT EXAM  07/26/2020  . HEMOGLOBIN A1C  09/25/2020  . COLONOSCOPY  05/10/2022  . TETANUS/TDAP  09/20/2027  . COVID-19 Vaccine  Completed  . Hepatitis C Screening  Completed  . HIV Screening  Addressed    Physical Exam: Vitals:   03/31/20 0834  BP: 122/78  Pulse: 79  Temp: 97.7 F (36.5 C)  SpO2: 97%  Weight: 214 lb 6.4 oz (97.3 kg)  Height: 5\' 8"  (1.727 m)   Body mass index is 32.6 kg/m. Physical Exam Vitals reviewed.  Constitutional:      General: He is not in acute distress.    Appearance: Normal appearance. He is obese. He is not toxic-appearing.  HENT:     Head: Normocephalic and atraumatic.     Right Ear: Tympanic membrane, ear canal and external ear normal.     Left Ear: Tympanic membrane, ear canal and external ear normal.     Nose: Congestion present.  Cardiovascular:     Rate and Rhythm: Normal rate and regular rhythm.     Pulses: Normal pulses.     Heart sounds: Normal heart sounds.  Pulmonary:     Effort: Pulmonary effort is normal.     Breath sounds: Normal breath sounds. No wheezing, rhonchi or rales.  Abdominal:     General: Bowel sounds are normal.  Musculoskeletal:        General: Normal range of motion.     Right lower leg: No edema.     Left lower leg: No edema.  Skin:    General: Skin is warm and dry.  Neurological:     General: No focal deficit present.     Mental Status: He is alert and oriented to person, place, and time.    Diabetic foot exam was performed with the following findings:   No deformities, ulcerations, or other skin breakdown Normal sensation of 10g monofilament Intact posterior tibialis and dorsalis pedis pulses    Labs reviewed: Basic Metabolic Panel: Recent Labs    06/08/19 0815 11/26/19 0823 03/27/20 0820  NA 141 140 141  K 4.2 3.8 3.8  CL 106 109 107  CO2 24 24 24   GLUCOSE 123* 119*  130*  BUN 15 15 15   CREATININE 0.86 0.84 0.76  CALCIUM 8.8 8.6 9.0   Liver Function Tests: Recent Labs    06/08/19 0815 11/26/19 0823  AST 17 14  ALT 16 15  BILITOT 0.9 1.0  PROT 6.4 5.9*   No results for input(s): LIPASE, AMYLASE in the last 8760 hours. No results for input(s): AMMONIA in the last 8760 hours. CBC: Recent Labs    06/08/19  0815 11/26/19 0823 03/27/20 0820  WBC 5.1 5.7 4.6  NEUTROABS 3,376 3,927 2,880  HGB 14.7 13.9 14.7  HCT 43.3 40.4 42.6  MCV 88.5 89.8 88.6  PLT 279 252 257   Lipid Panel: Recent Labs    06/08/19 0815 11/26/19 0823 03/27/20 0820  CHOL 106 102 105  HDL 45 40 43  LDLCALC 46 49 48  TRIG 70 58 64  CHOLHDL 2.4 2.6 2.4   Lab Results  Component Value Date   HGBA1C 6.5 (H) 03/27/2020     Assessment/Plan 1. Type 2 diabetes mellitus with other specified complication, without long-term current use of insulin (HCC) - control remains very good, but hba1c has trended up--he's aware of the changes he needs to make with pretzels and ice cream primarily so he will implement those -recheck in 4 mos - Hemoglobin A1c; Future - Lipid panel; Future - COMPLETE METABOLIC PANEL WITH GFR; Future - CBC with Differential/Platelet; Future  2. Hyperlipidemia associated with type 2 diabetes mellitus (HCC) - cont current regimen and diet and exercise - Lipid panel; Future - COMPLETE METABOLIC PANEL WITH GFR; Future - CBC with Differential/Platelet; Future  3. Environmental and seasonal allergies -having a bit of behind the ear discomfort but no visible effusions on ear exam, seems to have some sinus congestion that is likely due to this w/o any s/s of infection present  4. BMI 32.0-32.9,adult - counseled on diet and exercise as always, he also gets extensive counseling through his work [employer]er, [employer] - Hemoglobin A1c; Future - Lipid panel; Future  5. Class 1 obesity due to excess calories with serious comorbidity and body mass index (BMI) of  32.0 to 32.9 in adult -as in#4, cont diet and exercise, trying to get rid of his "covid lbs" - Hemoglobin A1c; Future - Lipid panel; Future  6. Need for influenza vaccination - Flu Vaccine QUAD High Dose(Fluad)--given  Labs/tests ordered:   Lab Orders     Hemoglobin A1c     Lipid panel     COMPLETE METABOLIC PANEL WITH GFR     CBC with Differential/Platelet  Next appt: 08/07/2020 for CPE, fasting labs before   Izel Hochberg L. Marcele Kosta, D.O. [facility] [address], [place] [address] Cell Phone (Mon-Fri 8am-5pm):  [phone] On Call:  [phone] & follow prompts after 5pm & weekends Office Phone:  [phone] Office Fax:  [phone]

## 2020-03-31 NOTE — Patient Instructions (Addendum)
[facility] will provide third COVID-19 Vaccine:   Booster shots will be at all [place] vaccination clinics at [email or web address] You may visit that website or call [phone] Monday thru Friday from 7am to 7pm.    Wait two weeks after your flu shot.     Calorie Counting for Weight Loss Calories are units of energy. Your body needs a certain amount of calories from food to keep you going throughout the day. When you eat more calories than your body needs, your body stores the extra calories as fat. When you eat fewer calories than your body needs, your body burns fat to get the energy it needs. Calorie counting means keeping track of how many calories you eat and drink each day. Calorie counting can be helpful if you need to lose weight. If you make sure to eat fewer calories than your body needs, you should lose weight. Ask your health care provider what a healthy weight is for you. For calorie counting to work, you will need to eat the right number of calories in a day in order to lose a healthy amount of weight per week. A dietitian can help you determine how many calories you need in a day and will give you suggestions on how to reach your calorie goal.  A healthy amount of weight to lose per week is usually 1-2 lb (0.5-0.9 kg). This usually means that your daily calorie intake should be reduced by 500-750 calories.  Eating 1,200 - 1,500 calories per day can help most women lose weight.  Eating 1,500 - 1,800 calories per day can help most men lose weight. What is my plan? My goal is to have __________ calories per day. If I have this many calories per day, I should lose around __________ pounds per week. What do I need to know about calorie counting? In order to meet your daily calorie goal, you will need to:  Find out how many calories are in each food you would like to eat. Try to do this before you eat.  Decide how much of the food you plan to eat.  Write down  what you ate and how many calories it had. Doing this is called keeping a food log. To successfully lose weight, it is important to balance calorie counting with a healthy lifestyle that includes regular activity. Aim for 150 minutes of moderate exercise (such as walking) or 75 minutes of vigorous exercise (such as running) each week. Where do I find calorie information?  The number of calories in a food can be found on a Nutrition Facts label. If a food does not have a Nutrition Facts label, try to look up the calories online or ask your dietitian for help. Remember that calories are listed per serving. If you choose to have more than one serving of a food, you will have to multiply the calories per serving by the amount of servings you plan to eat. For example, the label on a package of bread might say that a serving size is 1 slice and that there are 90 calories in a serving. If you eat 1 slice, you will have eaten 90 calories. If you eat 2 slices, you will have eaten 180 calories. How do I keep a food log? Immediately after each meal, record the following information in your food log:  What you ate. Don't forget to include toppings, sauces, and other extras on the food.  How much you ate. This can be measured  in cups, ounces, or number of items.  How many calories each food and drink had.  The total number of calories in the meal. Keep your food log near you, such as in a small notebook in your pocket, or use a mobile app or website. Some programs will calculate calories for you and show you how many calories you have left for the day to meet your goal. What are some calorie counting tips?   Use your calories on foods and drinks that will fill you up and not leave you hungry: ? Some examples of foods that fill you up are nuts and nut butters, vegetables, lean proteins, and high-fiber foods like whole grains. High-fiber foods are foods with more than 5 g fiber per serving. ? Drinks such as  sodas, specialty coffee drinks, alcohol, and juices have a lot of calories, yet do not fill you up.  Eat nutritious foods and avoid empty calories. Empty calories are calories you get from foods or beverages that do not have many vitamins or protein, such as candy, sweets, and soda. It is better to have a nutritious high-calorie food (such as an avocado) than a food with few nutrients (such as a bag of chips).  Know how many calories are in the foods you eat most often. This will help you calculate calorie counts faster.  Pay attention to calories in drinks. Low-calorie drinks include water and unsweetened drinks.  Pay attention to nutrition labels for "low fat" or "fat free" foods. These foods sometimes have the same amount of calories or more calories than the full fat versions. They also often have added sugar, starch, or salt, to make up for flavor that was removed with the fat.  Find a way of tracking calories that works for you. Get creative. Try different apps or programs if writing down calories does not work for you. What are some portion control tips?  Know how many calories are in a serving. This will help you know how many servings of a certain food you can have.  Use a measuring cup to measure serving sizes. You could also try weighing out portions on a kitchen scale. With time, you will be able to estimate serving sizes for some foods.  Take some time to put servings of different foods on your favorite plates, bowls, and cups so you know what a serving looks like.  Try not to eat straight from a bag or box. Doing this can lead to overeating. Put the amount you would like to eat in a cup or on a plate to make sure you are eating the right portion.  Use smaller plates, glasses, and bowls to prevent overeating.  Try not to multitask (for example, watch TV or use your computer) while eating. If it is time to eat, sit down at a table and enjoy your food. This will help you to know  when you are full. It will also help you to be aware of what you are eating and how much you are eating. What are tips for following this plan? Reading food labels  Check the calorie count compared to the serving size. The serving size may be smaller than what you are used to eating.  Check the source of the calories. Make sure the food you are eating is high in vitamins and protein and low in saturated and trans fats. Shopping  Read nutrition labels while you shop. This will help you make healthy decisions before you decide to  purchase your food.  Make a grocery list and stick to it. Cooking  Try to cook your favorite foods in a healthier way. For example, try baking instead of frying.  Use low-fat dairy products. Meal planning  Use more fruits and vegetables. Half of your plate should be fruits and vegetables.  Include lean proteins like poultry and fish. How do I count calories when eating out?  Ask for smaller portion sizes.  Consider sharing an entree and sides instead of getting your own entree.  If you get your own entree, eat only half. Ask for a box at the beginning of your meal and put the rest of your entree in it so you are not tempted to eat it.  If calories are listed on the menu, choose the lower calorie options.  Choose dishes that include vegetables, fruits, whole grains, low-fat dairy products, and lean protein.  Choose items that are boiled, broiled, grilled, or steamed. Stay away from items that are buttered, battered, fried, or served with cream sauce. Items labeled "crispy" are usually fried, unless stated otherwise.  Choose water, low-fat milk, unsweetened iced tea, or other drinks without added sugar. If you want an alcoholic beverage, choose a lower calorie option such as a glass of wine or light beer.  Ask for dressings, sauces, and syrups on the side. These are usually high in calories, so you should limit the amount you eat.  If you want a salad,  choose a garden salad and ask for grilled meats. Avoid extra toppings like bacon, cheese, or fried items. Ask for the dressing on the side, or ask for olive oil and vinegar or lemon to use as dressing.  Estimate how many servings of a food you are given. For example, a serving of cooked rice is  cup or about the size of half a baseball. Knowing serving sizes will help you be aware of how much food you are eating at restaurants. The list below tells you how big or small some common portion sizes are based on everyday objects: ? 1 oz--4 stacked dice. ? 3 oz--1 deck of cards. ? 1 tsp--1 die. ? 1 Tbsp-- a ping-pong ball. ? 2 Tbsp--1 ping-pong ball. ?  cup-- baseball. ? 1 cup--1 baseball. Summary  Calorie counting means keeping track of how many calories you eat and drink each day. If you eat fewer calories than your body needs, you should lose weight.  A healthy amount of weight to lose per week is usually 1-2 lb (0.5-0.9 kg). This usually means reducing your daily calorie intake by 500-750 calories.  The number of calories in a food can be found on a Nutrition Facts label. If a food does not have a Nutrition Facts label, try to look up the calories online or ask your dietitian for help.  Use your calories on foods and drinks that will fill you up, and not on foods and drinks that will leave you hungry.  Use smaller plates, glasses, and bowls to prevent overeating. This information is not intended to replace advice given to you by your health care provider. Make sure you discuss any questions you have with your health care provider. Document Revised: 02/24/2018 Document Reviewed: 05/07/2016 Elsevier Patient Education  [place].

## 2020-04-20 ENCOUNTER — Other Ambulatory Visit: Payer: Self-pay | Admitting: Internal Medicine

## 2020-04-28 DIAGNOSIS — H2513 Age-related nuclear cataract, bilateral: Secondary | ICD-10-CM | POA: Diagnosis not present

## 2020-04-28 DIAGNOSIS — Z7984 Long term (current) use of oral hypoglycemic drugs: Secondary | ICD-10-CM | POA: Diagnosis not present

## 2020-04-28 DIAGNOSIS — E119 Type 2 diabetes mellitus without complications: Secondary | ICD-10-CM | POA: Diagnosis not present

## 2020-04-28 DIAGNOSIS — H43393 Other vitreous opacities, bilateral: Secondary | ICD-10-CM | POA: Diagnosis not present

## 2020-06-22 ENCOUNTER — Other Ambulatory Visit: Payer: Self-pay | Admitting: Internal Medicine

## 2020-06-23 NOTE — Telephone Encounter (Signed)
rx sent to pharmacy by e-script  

## 2020-07-20 ENCOUNTER — Other Ambulatory Visit: Payer: Self-pay | Admitting: Internal Medicine

## 2020-07-20 DIAGNOSIS — E1169 Type 2 diabetes mellitus with other specified complication: Secondary | ICD-10-CM

## 2020-07-20 DIAGNOSIS — E119 Type 2 diabetes mellitus without complications: Secondary | ICD-10-CM

## 2020-07-21 NOTE — Telephone Encounter (Signed)
rx sent to pharmacy by e-script  

## 2020-07-25 ENCOUNTER — Other Ambulatory Visit: Payer: Self-pay | Admitting: Internal Medicine

## 2020-07-29 ENCOUNTER — Other Ambulatory Visit: Payer: Self-pay | Admitting: Internal Medicine

## 2020-07-29 DIAGNOSIS — E785 Hyperlipidemia, unspecified: Secondary | ICD-10-CM

## 2020-07-29 DIAGNOSIS — E1169 Type 2 diabetes mellitus with other specified complication: Secondary | ICD-10-CM

## 2020-07-29 DIAGNOSIS — I10 Essential (primary) hypertension: Secondary | ICD-10-CM

## 2020-07-29 NOTE — Telephone Encounter (Signed)
rx sent to pharmacy by e-script  

## 2020-08-04 ENCOUNTER — Other Ambulatory Visit: Payer: Self-pay

## 2020-08-04 ENCOUNTER — Other Ambulatory Visit: Payer: PRIVATE HEALTH INSURANCE

## 2020-08-04 DIAGNOSIS — Z6832 Body mass index (BMI) 32.0-32.9, adult: Secondary | ICD-10-CM | POA: Diagnosis not present

## 2020-08-04 DIAGNOSIS — E6609 Other obesity due to excess calories: Secondary | ICD-10-CM

## 2020-08-04 DIAGNOSIS — E1169 Type 2 diabetes mellitus with other specified complication: Secondary | ICD-10-CM | POA: Diagnosis not present

## 2020-08-04 DIAGNOSIS — E785 Hyperlipidemia, unspecified: Secondary | ICD-10-CM | POA: Diagnosis not present

## 2020-08-05 LAB — COMPLETE METABOLIC PANEL WITH GFR
AG Ratio: 1.9 (calc) (ref 1.0–2.5)
ALT: 16 U/L (ref 9–46)
AST: 16 U/L (ref 10–35)
Albumin: 4.3 g/dL (ref 3.6–5.1)
Alkaline phosphatase (APISO): 59 U/L (ref 35–144)
BUN: 16 mg/dL (ref 7–25)
CO2: 24 mmol/L (ref 20–32)
Calcium: 9.3 mg/dL (ref 8.6–10.3)
Chloride: 105 mmol/L (ref 98–110)
Creat: 0.93 mg/dL (ref 0.70–1.25)
GFR, Est African American: 100 mL/min/{1.73_m2} (ref >=60)
GFR, Est Non African American: 86 mL/min/{1.73_m2} (ref >=60)
Globulin: 2.3 g/dL (calc) (ref 1.9–3.7)
Glucose, Bld: 112 mg/dL — ABNORMAL HIGH (ref 65–99)
Potassium: 3.9 mmol/L (ref 3.5–5.3)
Sodium: 139 mmol/L (ref 135–146)
Total Bilirubin: 1.4 mg/dL — ABNORMAL HIGH (ref 0.2–1.2)
Total Protein: 6.6 g/dL (ref 6.1–8.1)

## 2020-08-05 LAB — CBC WITH DIFFERENTIAL/PLATELET
Absolute Monocytes: 414 cells/uL (ref 200–950)
Basophils Absolute: 42 cells/uL (ref 0–200)
Basophils Relative: 0.7 %
Eosinophils Absolute: 78 cells/uL (ref 15–500)
Eosinophils Relative: 1.3 %
HCT: 42.8 % (ref 38.5–50.0)
Hemoglobin: 14.6 g/dL (ref 13.2–17.1)
Lymphs Abs: 1392 cells/uL (ref 850–3900)
MCH: 30.2 pg (ref 27.0–33.0)
MCHC: 34.1 g/dL (ref 32.0–36.0)
MCV: 88.4 fL (ref 80.0–100.0)
MPV: 9.7 fL (ref 7.5–12.5)
Monocytes Relative: 6.9 %
Neutro Abs: 4074 cells/uL (ref 1500–7800)
Neutrophils Relative %: 67.9 %
Platelets: 270 10*3/uL (ref 140–400)
RBC: 4.84 10*6/uL (ref 4.20–5.80)
RDW: 12.7 % (ref 11.0–15.0)
Total Lymphocyte: 23.2 %
WBC: 6 10*3/uL (ref 3.8–10.8)

## 2020-08-05 LAB — HEMOGLOBIN A1C
Hgb A1c MFr Bld: 6.7 % of total Hgb — ABNORMAL HIGH (ref <5.7)
Mean Plasma Glucose: 146 mg/dL
eAG (mmol/L): 8.1 mmol/L

## 2020-08-05 LAB — LIPID PANEL
Cholesterol: 110 mg/dL (ref <200)
HDL: 45 mg/dL (ref >=40)
LDL Cholesterol (Calc): 50 mg/dL (calc)
Non-HDL Cholesterol (Calc): 65 mg/dL (calc) (ref <130)
Total CHOL/HDL Ratio: 2.4 (calc) (ref <5.0)
Triglycerides: 73 mg/dL (ref <150)

## 2020-08-05 NOTE — Progress Notes (Signed)
Labs are ok except sugar average trended up so we'll discuss that at his visit and what to do about it.

## 2020-08-07 ENCOUNTER — Ambulatory Visit (INDEPENDENT_AMBULATORY_CARE_PROVIDER_SITE_OTHER): Payer: BC Managed Care – PPO | Admitting: Internal Medicine

## 2020-08-07 ENCOUNTER — Encounter: Payer: Self-pay | Admitting: Internal Medicine

## 2020-08-07 ENCOUNTER — Other Ambulatory Visit: Payer: Self-pay

## 2020-08-07 VITALS — BP 138/82 | HR 96 | Temp 97.5°F | Ht 68.0 in | Wt 215.2 lb

## 2020-08-07 DIAGNOSIS — E6609 Other obesity due to excess calories: Secondary | ICD-10-CM

## 2020-08-07 DIAGNOSIS — E785 Hyperlipidemia, unspecified: Secondary | ICD-10-CM

## 2020-08-07 DIAGNOSIS — Z6832 Body mass index (BMI) 32.0-32.9, adult: Secondary | ICD-10-CM

## 2020-08-07 DIAGNOSIS — G4733 Obstructive sleep apnea (adult) (pediatric): Secondary | ICD-10-CM

## 2020-08-07 DIAGNOSIS — J3089 Other allergic rhinitis: Secondary | ICD-10-CM | POA: Diagnosis not present

## 2020-08-07 DIAGNOSIS — E1169 Type 2 diabetes mellitus with other specified complication: Secondary | ICD-10-CM | POA: Diagnosis not present

## 2020-08-07 DIAGNOSIS — S83241D Other tear of medial meniscus, current injury, right knee, subsequent encounter: Secondary | ICD-10-CM

## 2020-08-07 NOTE — Progress Notes (Signed)
Location:  [facility] clinic Provider:  Dorreen Valiente L. Mariea Clonts, D.O., C.M.D.  Goals of Care:  Advanced Directives 08/07/2020  Does Patient Have a Medical Advance Directive? No  Copy of Healthcare Power of Attorney in Chart? -  Would patient like information on creating a medical advance directive? No - Patient declined     Chief Complaint  Patient presents with  . Medical Management of Chronic Issues    4 month follow up  and discuss elevated A1C  . Health Maintenance    HPI: Patient is a 65 y.o. male seen today for medical management of chronic diseases.    Has had pressure behind his ears he associates with allergies and sinuses.  Comes and goes.    [facility] visit in November [facility].  No diabetic retinopathy.  He still has nuclear cataracts but not in need of removal.    Hba1c is up:  Taking [place] 100mg  daily, metformin 1000mg  bid, trulicity 0.75mg  weekly.  Thinks too much snacking on carbs impacted this and limited exercise with cold weather.  He has to cut those things out again.  Could do nuts, fruit, yogurt, veggies as snacks.  Avoid vending machine pretzels and chips.  Holidays impacted.    Weight is stable.   Knee is the same.  Just pressing on with it.    He's still not back on track with using his CPAP.  He knows that's hurting him in the long-run.    Past Medical History:  Diagnosis Date  . Allergy   . Cancer ([place])    skin cancer- squamous cell   . Diabetes mellitus without complication (HCC)    Type 2  . H/O lymph node excision [place] , [place]  . Hypertension   . Sleep apnea    wears cpap   . Viral pneumonia 1965    Past Surgical History:  Procedure Laterality Date  . COLONOSCOPY    . LYMPH NODE DISSECTION  1998  . perirectal abcess  1989  . POLYPECTOMY    . SKIN BIOPSY Right 05/21/15   forearm Robyne Askew PA  . WISDOM TOOTH EXTRACTION      Allergies  Allergen Reactions  . Fish Allergy Swelling    Tuna  fish--- Swelling of lips     Outpatient Encounter Medications as of 08/07/2020  Medication Sig  . aspirin 81 MG tablet Take 81 mg by mouth daily.  Marland Kitchen atorvastatin (LIPITOR) 20 MG tablet TAKE 1 TABLET BY MOUTH ONCE DAILY IN THE EVENING AT  6  PM  . benazepril-hydrochlorthiazide (LOTENSIN HCT) 20-12.5 MG tablet Take 1 tablet by mouth once daily  . Dextromethorphan-guaiFENesin (MUCINEX DM MAXIMUM STRENGTH PO) Take by mouth 2 (two) times daily after a meal.  . Dulaglutide (TRULICITY) 0.35 WS/5.6CL SOPN INJECT 1 PRE-FILLED PEN SUBCUTANEOUSLY ONCE A WEEK  . fluticasone (FLONASE) 50 MCG/ACT nasal spray Use 2 spray(s) in each nostril once daily  . levocetirizine (XYZAL) 5 MG tablet Take 5 mg by mouth every evening.  . metFORMIN (GLUCOPHAGE) 1000 MG tablet TAKE 1 TABLET BY MOUTH TWICE DAILY WITH MEALS  . psyllium (REGULOID) 0.52 G capsule Take 0.52 g by mouth daily.  . sitaGLIPtin (JANUVIA) 100 MG tablet Take 1 tablet (100 mg total) by mouth daily. With largest meal   No facility-administered encounter medications on file as of 08/07/2020.    Review of Systems:  Review of Systems  Constitutional: Negative for chills, fever and malaise/fatigue.  HENT:  Positive for congestion. Negative for sore throat.   Eyes: Negative for blurred vision.  Respiratory: Negative for cough and shortness of breath.   Cardiovascular: Negative for chest pain, palpitations and leg swelling.  Gastrointestinal: Negative for abdominal pain, blood in stool, constipation, diarrhea and melena.  Genitourinary: Negative for dysuria.  Musculoskeletal: Positive for joint pain. Negative for falls.  Skin: Negative for itching and rash.  Neurological: Negative for dizziness and loss of consciousness.  Endo/Heme/Allergies: Positive for environmental allergies. Does not bruise/bleed easily.  Psychiatric/Behavioral: Negative for depression and memory loss. The patient is not nervous/anxious and does not have insomnia.     Health  Maintenance  Topic Date Due  . COVID-19 Vaccine (4 - Booster for Pfizer series) 10/15/2020  . HEMOGLOBIN A1C  02/01/2021  . FOOT EXAM  03/31/2021  . OPHTHALMOLOGY EXAM  05/05/2021  . COLONOSCOPY (Pts 45-82yrs Insurance coverage will need to be confirmed)  05/10/2022  . TETANUS/TDAP  09/20/2027  . INFLUENZA VACCINE  Completed  . Hepatitis C Screening  Completed  . HIV Screening  Addressed    Physical Exam: Vitals:   08/07/20 0834  BP: 138/82  Pulse: 96  Temp: (!) 97.5 F (36.4 C)  TempSrc: Temporal  SpO2: 97%  Weight: 215 lb 3.2 oz (97.6 kg)  Height: 5\' 8"  (1.727 m)   Body mass index is 32.72 kg/m. Physical Exam Vitals reviewed.  Constitutional:      General: He is not in acute distress.    Appearance: Normal appearance. He is not ill-appearing or toxic-appearing.  HENT:     Head: Normocephalic and atraumatic.     Right Ear: Tympanic membrane, ear canal and external ear normal.     Left Ear: Tympanic membrane, ear canal and external ear normal.  Eyes:     Conjunctiva/sclera: Conjunctivae normal.     Pupils: Pupils are equal, round, and reactive to light.  Cardiovascular:     Rate and Rhythm: Normal rate and regular rhythm.     Pulses: Normal pulses.     Heart sounds: Normal heart sounds. No murmur heard.   Pulmonary:     Effort: Pulmonary effort is normal.     Breath sounds: Normal breath sounds. No wheezing, rhonchi or rales.  Abdominal:     General: Bowel sounds are normal.     Palpations: Abdomen is soft.     Tenderness: There is no abdominal tenderness.  Musculoskeletal:        General: Normal range of motion.     Cervical back: Neck supple.     Right lower leg: No edema.     Left lower leg: No edema.  Skin:    General: Skin is warm and dry.  Neurological:     General: No focal deficit present.     Mental Status: He is alert and oriented to person, place, and time.     Gait: Gait normal.  Psychiatric:        Mood and Affect: Mood normal.         Behavior: Behavior normal.    Labs reviewed: Basic Metabolic Panel: Recent Labs    11/26/19 0823 03/27/20 0820 08/04/20 0820  NA 140 141 139  K 3.8 3.8 3.9  CL 109 107 105  CO2 24 24 24   GLUCOSE 119* 130* 112*  BUN 15 15 16   CREATININE 0.84 0.76 0.93  CALCIUM 8.6 9.0 9.3   Liver Function Tests: Recent Labs    11/26/19 0823 08/04/20 0820  AST 14 16  ALT 15 16  BILITOT 1.0 1.4*  PROT 5.9* 6.6   No results for input(s): LIPASE, AMYLASE in the last 8760 hours. No results for input(s): AMMONIA in the last 8760 hours. CBC: Recent Labs    11/26/19 0823 03/27/20 0820 08/04/20 0820  WBC 5.7 4.6 6.0  NEUTROABS 3,927 2,880 4,074  HGB 13.9 14.7 14.6  HCT 40.4 42.6 42.8  MCV 89.8 88.6 88.4  PLT 252 257 270   Lipid Panel: Recent Labs    11/26/19 0823 03/27/20 0820 08/04/20 0820  CHOL 102 105 110  HDL 40 43 45  LDLCALC 49 48 50  TRIG 58 64 73  CHOLHDL 2.6 2.4 2.4   Lab Results  Component Value Date   HGBA1C 6.7 (H) 08/04/2020    Procedures since last visit: No results found.  Assessment/Plan 1. Type 2 diabetes mellitus with other specified complication, without long-term current use of insulin (HCC) - control worsened but remains fair -encouraged him to avoid carb snacks - CBC with Differential/Platelet; Future - Lipid panel; Future - Hemoglobin A1c; Future - COMPLETE METABOLIC PANEL WITH GFR; Future  2. Body mass index (BMI) of 32.0-32.9 in adult - counseled about diet and resuming walking as weather warms - Lipid panel; Future - Hemoglobin A1c; Future - COMPLETE METABOLIC PANEL WITH GFR; Future  3. Class 1 obesity due to excess calories with serious comorbidity and body mass index (BMI) of 32.0 to 32.9 in adult - cont to work on diet and exercise - Lipid panel; Future - Hemoglobin A1c; Future - COMPLETE METABOLIC PANEL WITH GFR; Future  4. Hyperlipidemia associated with type 2 diabetes mellitus ([place]) -cont current lipitor therapy and low  cholesterol diet, resume more walking as tolerated - Lipid panel; Future - Hemoglobin A1c; Future - COMPLETE METABOLIC PANEL WITH GFR; Future  5. Environmental and seasonal allergies -continues to have intermittent nasal/sinus congestion with ear pressure, but no signs of otitis or effusions or infection  6. Mild obstructive sleep apnea -not using cpap as advised  7. Tear of medial meniscus of right knee, unspecified tear type, unspecified whether old or current tear, subsequent encounter -continue to monitor, use tylenol and topicals if needed -he is not to where he wants to f/u with ortho  Labs/tests ordered:   Lab Orders     CBC with Differential/Platelet     Lipid panel     Hemoglobin A1c     COMPLETE METABOLIC PANEL WITH GFR  Next appt:  4 mos with Janett Billow, NP  Bettymae Yott L. Mardel Grudzien, D.O. [facility] [address], [place] [address] Cell Phone (Mon-Fri 8am-5pm):  [phone] On Call:  [phone] & follow prompts after 5pm & weekends Office Phone:  [phone] Office Fax:  [phone]

## 2020-08-11 ENCOUNTER — Encounter: Payer: Self-pay | Admitting: Internal Medicine

## 2020-08-17 ENCOUNTER — Other Ambulatory Visit: Payer: Self-pay | Admitting: Internal Medicine

## 2020-08-17 DIAGNOSIS — E1169 Type 2 diabetes mellitus with other specified complication: Secondary | ICD-10-CM

## 2020-08-27 ENCOUNTER — Other Ambulatory Visit: Payer: Self-pay

## 2020-08-27 ENCOUNTER — Ambulatory Visit: Payer: BC Managed Care – PPO | Admitting: Physician Assistant

## 2020-08-27 ENCOUNTER — Encounter: Payer: Self-pay | Admitting: Physician Assistant

## 2020-08-27 DIAGNOSIS — D1800 Hemangioma unspecified site: Secondary | ICD-10-CM | POA: Diagnosis not present

## 2020-08-27 DIAGNOSIS — R21 Rash and other nonspecific skin eruption: Secondary | ICD-10-CM

## 2020-08-27 DIAGNOSIS — Z1283 Encounter for screening for malignant neoplasm of skin: Secondary | ICD-10-CM

## 2020-08-27 DIAGNOSIS — L570 Actinic keratosis: Secondary | ICD-10-CM

## 2020-08-27 DIAGNOSIS — Z85828 Personal history of other malignant neoplasm of skin: Secondary | ICD-10-CM

## 2020-08-27 MED ORDER — KETOCONAZOLE 2 % EX SHAM: 1.0000 "application " | MEDICATED_SHAMPOO | Freq: Once | CUTANEOUS | 8 refills | Status: AC

## 2020-08-27 MED ORDER — CLOBETASOL PROPIONATE 0.05 % EX SOLN: 1.0000 "application " | Freq: Two times a day (BID) | CUTANEOUS | 6 refills | Status: DC

## 2020-09-07 ENCOUNTER — Other Ambulatory Visit: Payer: Self-pay | Admitting: Internal Medicine

## 2020-09-11 ENCOUNTER — Encounter: Payer: Self-pay | Admitting: Physician Assistant

## 2020-09-11 NOTE — Progress Notes (Signed)
   Follow-Up Visit   Subjective  [name] is a 65 y.o. male who presents for the following: Annual Exam (LEFT JAWLINE WHITE SPOTS AFTER PDT, LEFT FOREARM TAN SPOTS, FRONT SCALP SCALE PINK AREA AND CROWN).   The following portions of the chart were reviewed this encounter and updated as appropriate:  Tobacco  Allergies  Meds  Problems  Med Hx  Surg Hx  Fam Hx      Objective  Well appearing patient in no apparent distress; mood and affect are within normal limits.  All skin waist up examined.  Objective  waist up: No atypical nevi No signs of non-mole skin cancer.   Objective  Right Forearm: White scar- clear  Objective  Left Buccal Cheek, Left Forearm - Anterior, Left Parotid Area, Left Temple, Mid Forehead (2), Right Malar Cheek, Right Parotid Area, Right Scaphoid Fossa, Right Superior Helix, Right Temple: Erythematous patches with gritty scale.  Objective  lower extremities: Erythematous, scaly plaques involving the ankle and distal lower leg with lower leg edema.   Objective  Right Upper Arm - Anterior: Multiply red raised papile   Assessment & Plan  Encounter for screening for malignant neoplasm of skin waist up  Yearly skin check  History of squamous cell carcinoma of skin Right Forearm  Yearly skin check  AK (actinic keratosis) (11) Left Forearm - Anterior; Right Superior Helix; Right Scaphoid Fossa; Mid Forehead (2); Left Temple; Right Temple; Left Parotid Area; Right Parotid Area; Right Malar Cheek; Left Buccal Cheek  Destruction of lesion - Left Buccal Cheek, Left Forearm - Anterior, Left Parotid Area, Left Temple, Mid Forehead (2), Right Malar Cheek, Right Parotid Area, Right Scaphoid Fossa, Right Superior Helix, Right Temple  Destruction method: cryotherapy   Cryotherapy cycles:  3  Rash and other nonspecific skin eruption lower extremities  clobetasol (TEMOVATE) 0.05 % external solution - lower extremities  Hemangioma, unspecified  site Right Upper Arm - Anterior  Okay to leave if stable    I, Tykel Badie, PA-C, have reviewed all documentation's for this visit.  The documentation on 09/11/20 for the exam, diagnosis, procedures and orders are all accurate and complete.

## 2020-09-14 ENCOUNTER — Other Ambulatory Visit: Payer: Self-pay | Admitting: Internal Medicine

## 2020-09-14 DIAGNOSIS — E1169 Type 2 diabetes mellitus with other specified complication: Secondary | ICD-10-CM

## 2020-10-10 ENCOUNTER — Other Ambulatory Visit: Payer: Self-pay | Admitting: Nurse Practitioner

## 2020-10-10 DIAGNOSIS — E1169 Type 2 diabetes mellitus with other specified complication: Secondary | ICD-10-CM

## 2020-10-12 ENCOUNTER — Encounter: Payer: Self-pay | Admitting: Internal Medicine

## 2020-10-27 ENCOUNTER — Other Ambulatory Visit: Payer: Self-pay | Admitting: *Deleted

## 2020-10-27 DIAGNOSIS — E119 Type 2 diabetes mellitus without complications: Secondary | ICD-10-CM

## 2020-10-27 DIAGNOSIS — E1169 Type 2 diabetes mellitus with other specified complication: Secondary | ICD-10-CM

## 2020-10-27 MED ORDER — METFORMIN HCL 1000 MG PO TABS: 1.0000 | ORAL_TABLET | Freq: Two times a day (BID) | ORAL | 0 refills | Status: DC

## 2020-10-27 MED ORDER — ATORVASTATIN CALCIUM 20 MG PO TABS: ORAL_TABLET | ORAL | 0 refills | Status: DC

## 2020-10-27 NOTE — Addendum Note (Signed)
Addended by: Rafael Bihari A on: 10/27/2020 12:56 PM   Modules accepted: Orders

## 2020-10-27 NOTE — Telephone Encounter (Signed)
Pharmacy requested refill

## 2020-11-02 ENCOUNTER — Other Ambulatory Visit: Payer: Self-pay | Admitting: Nurse Practitioner

## 2020-11-03 ENCOUNTER — Other Ambulatory Visit: Payer: Self-pay | Admitting: *Deleted

## 2020-11-03 DIAGNOSIS — I10 Essential (primary) hypertension: Secondary | ICD-10-CM

## 2020-11-03 MED ORDER — BENAZEPRIL-HYDROCHLOROTHIAZIDE 20-12.5 MG PO TABS: 1.0000 | ORAL_TABLET | Freq: Every day | ORAL | 0 refills | Status: DC

## 2020-11-03 NOTE — Telephone Encounter (Signed)
[facility] requested refill.

## 2020-11-12 ENCOUNTER — Other Ambulatory Visit: Payer: Self-pay | Admitting: Nurse Practitioner

## 2020-11-12 DIAGNOSIS — E1169 Type 2 diabetes mellitus with other specified complication: Secondary | ICD-10-CM

## 2020-11-13 NOTE — Telephone Encounter (Signed)
Patient has request refill on medication "Trulicity". Patient last refill was 10/10/2020. I tried to send medication into pharmacy but warnings appeared. Medication pend and sent to Windell Moulding, NP due to PCP Dewaine Oats Carlos American, NP being out of office.

## 2020-12-05 ENCOUNTER — Other Ambulatory Visit: Payer: Self-pay

## 2020-12-05 DIAGNOSIS — Z6832 Body mass index (BMI) 32.0-32.9, adult: Secondary | ICD-10-CM

## 2020-12-05 DIAGNOSIS — E1169 Type 2 diabetes mellitus with other specified complication: Secondary | ICD-10-CM

## 2020-12-09 ENCOUNTER — Other Ambulatory Visit: Payer: Self-pay

## 2020-12-09 ENCOUNTER — Other Ambulatory Visit: Payer: BC Managed Care – PPO

## 2020-12-09 DIAGNOSIS — E6609 Other obesity due to excess calories: Secondary | ICD-10-CM

## 2020-12-09 DIAGNOSIS — E1169 Type 2 diabetes mellitus with other specified complication: Secondary | ICD-10-CM | POA: Diagnosis not present

## 2020-12-09 DIAGNOSIS — E785 Hyperlipidemia, unspecified: Secondary | ICD-10-CM

## 2020-12-09 DIAGNOSIS — Z6832 Body mass index (BMI) 32.0-32.9, adult: Secondary | ICD-10-CM

## 2020-12-10 LAB — COMPLETE METABOLIC PANEL WITH GFR
AG Ratio: 1.9 (calc) (ref 1.0–2.5)
ALT: 14 U/L (ref 9–46)
AST: 17 U/L (ref 10–35)
Albumin: 4.2 g/dL (ref 3.6–5.1)
Alkaline phosphatase (APISO): 59 U/L (ref 35–144)
BUN: 18 mg/dL (ref 7–25)
CO2: 23 mmol/L (ref 20–32)
Calcium: 9 mg/dL (ref 8.6–10.3)
Chloride: 105 mmol/L (ref 98–110)
Creat: 0.84 mg/dL (ref 0.70–1.25)
GFR, Est African American: 107 mL/min/{1.73_m2} (ref >=60)
GFR, Est Non African American: 93 mL/min/{1.73_m2} (ref >=60)
Globulin: 2.2 g/dL (calc) (ref 1.9–3.7)
Glucose, Bld: 128 mg/dL — ABNORMAL HIGH (ref 65–99)
Potassium: 3.6 mmol/L (ref 3.5–5.3)
Sodium: 140 mmol/L (ref 135–146)
Total Bilirubin: 1.1 mg/dL (ref 0.2–1.2)
Total Protein: 6.4 g/dL (ref 6.1–8.1)

## 2020-12-10 LAB — LIPID PANEL
Cholesterol: 116 mg/dL (ref <200)
HDL: 40 mg/dL (ref >=40)
LDL Cholesterol (Calc): 56 mg/dL (calc)
Non-HDL Cholesterol (Calc): 76 mg/dL (calc) (ref <130)
Total CHOL/HDL Ratio: 2.9 (calc) (ref <5.0)
Triglycerides: 116 mg/dL (ref <150)

## 2020-12-10 LAB — CBC WITH DIFFERENTIAL/PLATELET
Absolute Monocytes: 478 cells/uL (ref 200–950)
Basophils Absolute: 59 cells/uL (ref 0–200)
Basophils Relative: 1 %
Eosinophils Absolute: 159 cells/uL (ref 15–500)
Eosinophils Relative: 2.7 %
HCT: 44.5 % (ref 38.5–50.0)
Hemoglobin: 15 g/dL (ref 13.2–17.1)
Lymphs Abs: 1469 cells/uL (ref 850–3900)
MCH: 30.1 pg (ref 27.0–33.0)
MCHC: 33.7 g/dL (ref 32.0–36.0)
MCV: 89.2 fL (ref 80.0–100.0)
MPV: 9.7 fL (ref 7.5–12.5)
Monocytes Relative: 8.1 %
Neutro Abs: 3735 cells/uL (ref 1500–7800)
Neutrophils Relative %: 63.3 %
Platelets: 258 10*3/uL (ref 140–400)
RBC: 4.99 10*6/uL (ref 4.20–5.80)
RDW: 12.9 % (ref 11.0–15.0)
Total Lymphocyte: 24.9 %
WBC: 5.9 10*3/uL (ref 3.8–10.8)

## 2020-12-10 LAB — HEMOGLOBIN A1C
Hgb A1c MFr Bld: 6.7 % of total Hgb — ABNORMAL HIGH (ref <5.7)
Mean Plasma Glucose: 146 mg/dL
eAG (mmol/L): 8.1 mmol/L

## 2020-12-12 ENCOUNTER — Ambulatory Visit: Payer: BC Managed Care – PPO | Admitting: Nurse Practitioner

## 2020-12-12 ENCOUNTER — Other Ambulatory Visit: Payer: Self-pay

## 2020-12-12 ENCOUNTER — Encounter: Payer: Self-pay | Admitting: Nurse Practitioner

## 2020-12-12 VITALS — BP 142/98 | HR 83 | Temp 97.1°F | Ht 68.0 in | Wt 212.0 lb

## 2020-12-12 DIAGNOSIS — I10 Essential (primary) hypertension: Secondary | ICD-10-CM

## 2020-12-12 DIAGNOSIS — E785 Hyperlipidemia, unspecified: Secondary | ICD-10-CM

## 2020-12-12 DIAGNOSIS — G4733 Obstructive sleep apnea (adult) (pediatric): Secondary | ICD-10-CM | POA: Diagnosis not present

## 2020-12-12 DIAGNOSIS — J3089 Other allergic rhinitis: Secondary | ICD-10-CM | POA: Diagnosis not present

## 2020-12-12 DIAGNOSIS — E1169 Type 2 diabetes mellitus with other specified complication: Secondary | ICD-10-CM

## 2020-12-12 DIAGNOSIS — S83241D Other tear of medial meniscus, current injury, right knee, subsequent encounter: Secondary | ICD-10-CM

## 2020-12-12 NOTE — Progress Notes (Signed)
Careteam: Patient Care Team: Lauree Chandler, NP as PCP - General (Geriatric Medicine) Lavonna Monarch, MD as Consulting Physician (Dermatology) Leandrew Koyanagi, MD as Attending Physician (Orthopedic Surgery) Roel Cluck, MD as Referring Physician (Ophthalmology) Deneise Lever, MD as Consulting Physician (Pulmonary Disease) Sheffield, Ronalee Red, PA-C as Physician Assistant (Dermatology)  PLACE OF SERVICE:  [place] Directive information Does Patient Have a Medical Advance Directive?: Yes, Does patient want to make changes to medical advance directive?: Yes (MAU/Ambulatory/Procedural Areas - Information given)  Allergies  Allergen Reactions   Fish Allergy Swelling    Tuna fish--- Swelling of lips     Chief Complaint  Patient presents with   Medical Management of Chronic Issues    4 month follow-up. Patient would like his ears examined and injury on right lower leg. Discuss need for pneu vaccine or exclude.      HPI: Patient is a 65 y.o. male for routine follow up.   DM- a1c stable. No hypoglycemia, compliant with medication.   Htn- took medication this morning, does not check blood pressure at home.  Generally 130s/80s  Obesity- walks but not consistent. Needs to make dietary modifications.   Right knee pain- Reports he has a torn meniscus back in 2016. Still able to move and walk. Does not wish to back with ortho at this time.   OSA- not using CPAP.   Seasonal allergies- using xyzal, flonase and saline sprays. This keeps things at [place].    Review of Systems:  Review of Systems  Constitutional:  Negative for chills, fever and weight loss.  HENT:  Negative for tinnitus.   Respiratory:  Negative for cough, sputum production and shortness of breath.   Cardiovascular:  Negative for chest pain, palpitations and leg swelling.  Gastrointestinal:  Negative for abdominal pain, constipation, diarrhea and heartburn.  Genitourinary:  Negative for  dysuria, frequency and urgency.  Musculoskeletal:  Positive for joint pain. Negative for back pain, falls and myalgias.  Skin: Negative.   Neurological:  Negative for dizziness and headaches.  Endo/Heme/Allergies:  Positive for environmental allergies.  Psychiatric/Behavioral:  Negative for depression and memory loss. The patient does not have insomnia.    Past Medical History:  Diagnosis Date   Allergy    Cancer ([place])    skin cancer- squamous cell    Diabetes mellitus without complication ([place])    Type 2   H/O lymph node excision 1998   [place] , [place]   Hypertension    Sleep apnea    wears cpap    Squamous cell carcinoma of skin 12/31/2014   RIGHT FOREARM TX = CX3 5FU   Viral pneumonia 1965   Past Surgical History:  Procedure Laterality Date   COLONOSCOPY     LYMPH NODE DISSECTION  1998   perirectal abcess  1989   POLYPECTOMY     SKIN BIOPSY Right 05/21/15   forearm Robyne Askew PA   WISDOM TOOTH EXTRACTION     Social History:   reports that he has never smoked. He has never used smokeless tobacco. He reports current alcohol use of about 1.0 standard drink of alcohol per week. He reports that he does not use drugs.  Family History  Problem Relation Age of Onset   Hypertension Mother    Diabetes Mother 43       Diabetes type 2   Heart disease Father 55       Heart Attack   Colon cancer Neg Hx  Colon polyps Neg Hx    Esophageal cancer Neg Hx    Rectal cancer Neg Hx    Stomach cancer Neg Hx     Medications: Patient's Medications  New Prescriptions   No medications on file  Previous Medications   ASPIRIN 81 MG TABLET    Take 81 mg by mouth daily.   ATORVASTATIN (LIPITOR) 20 MG TABLET    Take one tablet by mouth once daily in the evening at 6pm   BENAZEPRIL-HYDROCHLORTHIAZIDE (LOTENSIN HCT) 20-12.5 MG TABLET    Take 1 tablet by mouth daily.   CLOBETASOL (TEMOVATE) 0.05 % EXTERNAL SOLUTION    Apply 1 application topically 2 (two) times daily.    DEXTROMETHORPHAN-GUAIFENESIN (MUCINEX DM MAXIMUM STRENGTH PO)    Take by mouth 2 (two) times daily after a meal.   FLUTICASONE (FLONASE) 50 MCG/ACT NASAL SPRAY    Use 2 spray(s) in each nostril once daily   LEVOCETIRIZINE (XYZAL) 5 MG TABLET    Take 5 mg by mouth every evening.   METFORMIN (GLUCOPHAGE) 1000 MG TABLET    Take 1 tablet (1,000 mg total) by mouth 2 (two) times daily with a meal.   PSYLLIUM (REGULOID) 0.52 G CAPSULE    Take 0.52 g by mouth daily.   SITAGLIPTIN (JANUVIA) 100 MG TABLET    Take 1 tablet (100 mg total) by mouth daily. With largest meal   TRULICITY 0.10 XN/2.3FT SOPN    INJECT 1 SYRINGE SUBCUTANEOUSLY ONCE A WEEK  Modified Medications   No medications on file  Discontinued Medications   No medications on file    Physical Exam:  Vitals:   12/12/20 0906  BP: (!) 142/98  Pulse: 83  Temp: (!) 97.1 F (36.2 C)  TempSrc: Temporal  SpO2: 96%  Weight: 212 lb (96.2 kg)  Height: 5\' 8"  (1.727 m)   Body mass index is 32.23 kg/m. Wt Readings from Last 3 Encounters:  12/12/20 212 lb (96.2 kg)  08/07/20 215 lb 3.2 oz (97.6 kg)  03/31/20 214 lb 6.4 oz (97.3 kg)    Physical Exam Constitutional:      General: He is not in acute distress.    Appearance: He is well-developed. He is not diaphoretic.  HENT:     Head: Normocephalic and atraumatic.     Right Ear: External ear normal.     Left Ear: External ear normal.     Mouth/Throat:     Pharynx: No oropharyngeal exudate.  Eyes:     Conjunctiva/sclera: Conjunctivae normal.     Pupils: Pupils are equal, round, and reactive to light.  Cardiovascular:     Rate and Rhythm: Normal rate and regular rhythm.     Heart sounds: Normal heart sounds.  Pulmonary:     Effort: Pulmonary effort is normal.     Breath sounds: Normal breath sounds.  Abdominal:     General: Bowel sounds are normal.     Palpations: Abdomen is soft.  Musculoskeletal:        General: No tenderness.     Cervical back: Normal range of motion and  neck supple.     Right lower leg: No edema.     Left lower leg: No edema.  Skin:    General: Skin is warm and dry.  Neurological:     Mental Status: He is alert and oriented to person, place, and time.    Labs reviewed: Basic Metabolic Panel: Recent Labs    03/27/20 0820 08/04/20 0820 12/09/20 0835  NA 141  139 140  K 3.8 3.9 3.6  CL 107 105 105  CO2 24 24 23   GLUCOSE 130* 112* 128*  BUN 15 16 18   CREATININE 0.76 0.93 0.84  CALCIUM 9.0 9.3 9.0   Liver Function Tests: Recent Labs    08/04/20 0820 12/09/20 0835  AST 16 17  ALT 16 14  BILITOT 1.4* 1.1  PROT 6.6 6.4   No results for input(s): LIPASE, AMYLASE in the last 8760 hours. No results for input(s): AMMONIA in the last 8760 hours. CBC: Recent Labs    03/27/20 0820 08/04/20 0820 12/09/20 0835  WBC 4.6 6.0 5.9  NEUTROABS 2,880 4,074 3,735  HGB 14.7 14.6 15.0  HCT 42.6 42.8 44.5  MCV 88.6 88.4 89.2  PLT 257 270 258   Lipid Panel: Recent Labs    03/27/20 0820 08/04/20 0820 12/09/20 0835  CHOL 105 110 116  HDL 43 45 40  LDLCALC 48 50 56  TRIG 64 73 116  CHOLHDL 2.4 2.4 2.9   TSH: No results for input(s): TSH in the last 8760 hours. A1C: Lab Results  Component Value Date   HGBA1C 6.7 (H) 12/09/2020     Assessment/Plan 1. Type 2 diabetes mellitus with other specified complication, without long-term current use of insulin (HCC) -a1c stable. Will continue current medication regimen.  -Encouraged dietary compliance, routine foot care/monitoring and to keep up with diabetic eye exams through ophthalmology  - CBC with Differential/Platelet; Future - Hemoglobin A1c; Future  2. Hyperlipidemia associated with type 2 diabetes mellitus (HCC) -LDL at goal. Continues on dietary modifications with lipitor 20 mg daily.  - COMPLETE METABOLIC PANEL WITH GFR; Future - Lipid panel; Future  3. Mild obstructive sleep apnea Encouraged use of CPAP, educated on benefits and risk.   4. Tear of medial meniscus  of right knee, unspecified tear type, unspecified whether old or current tear, subsequent encounter Old tear, originally followed with ortho, feels like he is doing well at this time.   5. Environmental and seasonal allergies Ongoing, stable on current regimen.    Next appt: 4 months, labs prior  Jozee Hammer K. [place], [facility] [phone]

## 2020-12-12 NOTE — Patient Instructions (Addendum)
Take blood pressure at home after you have taken medication and make sure you have been sitting down for several minutes before you take  Should be <140/90 If blood pressure is consistently running over this please let us know.   Wear CPAP  Increase physical activity- be more consistent.

## 2020-12-13 ENCOUNTER — Other Ambulatory Visit: Payer: Self-pay | Admitting: Orthopedic Surgery

## 2020-12-13 DIAGNOSIS — E1169 Type 2 diabetes mellitus with other specified complication: Secondary | ICD-10-CM

## 2020-12-22 ENCOUNTER — Encounter: Payer: Self-pay | Admitting: Nurse Practitioner

## 2020-12-22 DIAGNOSIS — I10 Essential (primary) hypertension: Secondary | ICD-10-CM

## 2020-12-23 MED ORDER — HYDROCHLOROTHIAZIDE 12.5 MG PO CAPS: 12.5000 mg | ORAL_CAPSULE | Freq: Every day | ORAL | 1 refills | Status: DC

## 2020-12-23 MED ORDER — BENAZEPRIL HCL 40 MG PO TABS: 40.0000 mg | ORAL_TABLET | Freq: Every day | ORAL | 3 refills | Status: DC

## 2020-12-23 NOTE — Telephone Encounter (Signed)
Message routed to Eub[place], Jessica K, NP  

## 2020-12-23 NOTE — Telephone Encounter (Signed)
Patients response to provider was routed to Lauree Chandler, NP

## 2020-12-29 ENCOUNTER — Other Ambulatory Visit: Payer: Self-pay | Admitting: *Deleted

## 2020-12-29 DIAGNOSIS — E1169 Type 2 diabetes mellitus with other specified complication: Secondary | ICD-10-CM

## 2020-12-29 MED ORDER — SITAGLIPTIN PHOSPHATE 100 MG PO TABS: 100.0000 mg | ORAL_TABLET | Freq: Every day | ORAL | 3 refills | Status: DC

## 2020-12-29 NOTE — Addendum Note (Signed)
Addended by: Rafael Bihari A on: 12/29/2020 10:18 AM   Modules accepted: Orders

## 2020-12-29 NOTE — Telephone Encounter (Signed)
Pharmacy requested refill

## 2020-12-30 ENCOUNTER — Other Ambulatory Visit: Payer: Self-pay

## 2020-12-30 ENCOUNTER — Telehealth: Payer: Self-pay

## 2020-12-30 DIAGNOSIS — E1169 Type 2 diabetes mellitus with other specified complication: Secondary | ICD-10-CM

## 2020-12-30 MED ORDER — TRULICITY 0.75 MG/0.5ML ~~LOC~~ SOAJ: SUBCUTANEOUS | 0 refills | Status: DC

## 2020-12-30 NOTE — Telephone Encounter (Signed)
Medication "Trulicity".

## 2020-12-30 NOTE — Telephone Encounter (Signed)
Patient has request refill on medication "Trulicity". I'm unsure how to do this medication. Medication pend and sent to PCP Lauree Chandler, NP .

## 2020-12-30 NOTE — Telephone Encounter (Signed)
Patient PA waiting for approval with Cover My Meds.

## 2021-01-01 ENCOUNTER — Other Ambulatory Visit: Payer: Self-pay

## 2021-01-01 ENCOUNTER — Ambulatory Visit: Payer: BC Managed Care – PPO | Admitting: Physician Assistant

## 2021-01-01 ENCOUNTER — Encounter: Payer: Self-pay | Admitting: Physician Assistant

## 2021-01-01 DIAGNOSIS — L570 Actinic keratosis: Secondary | ICD-10-CM | POA: Diagnosis not present

## 2021-01-01 DIAGNOSIS — L739 Follicular disorder, unspecified: Secondary | ICD-10-CM

## 2021-01-01 MED ORDER — MINOCYCLINE HCL 100 MG PO CAPS: 100.0000 mg | ORAL_CAPSULE | Freq: Every day | ORAL | 2 refills | Status: DC

## 2021-01-01 MED ORDER — FLUOROURACIL 5 % EX CREA: TOPICAL_CREAM | CUTANEOUS | 1 refills | Status: DC

## 2021-01-01 NOTE — Telephone Encounter (Signed)
Received fax from [facility] requesting Medical Records confirming diagnosis of Type II DM and a Provider Signature.   Printed last OV note and attached and placed in [employer] for signature.  To be faxed to [facility] Fax: [phone]

## 2021-01-01 NOTE — Progress Notes (Signed)
   Follow-Up Visit   Subjective  [name] is a 65 y.o. male who presents for the following: Follow-up (Here to have scaly spots rechecked on face & arms. LN2 last office visit- still has some scale. Also here to talk about [place] Cream this winter per last visit). Scalp is irritated and has white bumps that pop up.    The following portions of the chart were reviewed this encounter and updated as appropriate:  Tobacco  Allergies  Meds  Problems  Med Hx  Surg Hx  Fam Hx      Objective  Well appearing patient in no apparent distress; mood and affect are within normal limits.  A focused examination was performed including arms, face, and scalp. Relevant physical exam findings are noted in the Assessment and Plan.  both forearms Diffuse pink scale  Mid Parietal Scalp Erythema with scattered pustules.  Assessment & Plan  AK (actinic keratosis) both forearms  Warned  him to avoid sun. Apply at night and wash off in the am.  fluorouracil (EFUDEX) 5 % cream - both forearms Apply to affected area once a for 2 -4 weeks  Folliculitis Mid Parietal Scalp  Continue clobetasol solution.  minocycline (MINOCIN) 100 MG capsule - Mid Parietal Scalp Take 1 capsule (100 mg total) by mouth daily.   I, Shuree Brossart, PA-C, have reviewed all documentation's for this visit.  The documentation on 01/01/21 for the exam, diagnosis, procedures and orders are all accurate and complete.

## 2021-01-18 ENCOUNTER — Other Ambulatory Visit: Payer: Self-pay | Admitting: Orthopedic Surgery

## 2021-01-18 DIAGNOSIS — E1169 Type 2 diabetes mellitus with other specified complication: Secondary | ICD-10-CM

## 2021-01-20 ENCOUNTER — Other Ambulatory Visit: Payer: Self-pay | Admitting: Nurse Practitioner

## 2021-01-20 DIAGNOSIS — E119 Type 2 diabetes mellitus without complications: Secondary | ICD-10-CM

## 2021-01-20 DIAGNOSIS — E1169 Type 2 diabetes mellitus with other specified complication: Secondary | ICD-10-CM

## 2021-03-29 ENCOUNTER — Other Ambulatory Visit: Payer: Self-pay | Admitting: Physician Assistant

## 2021-03-29 DIAGNOSIS — L739 Follicular disorder, unspecified: Secondary | ICD-10-CM

## 2021-04-01 ENCOUNTER — Encounter: Payer: Self-pay | Admitting: Nurse Practitioner

## 2021-04-13 ENCOUNTER — Other Ambulatory Visit: Payer: Self-pay

## 2021-04-13 ENCOUNTER — Other Ambulatory Visit: Payer: BC Managed Care – PPO

## 2021-04-13 DIAGNOSIS — E785 Hyperlipidemia, unspecified: Secondary | ICD-10-CM | POA: Diagnosis not present

## 2021-04-13 DIAGNOSIS — E1169 Type 2 diabetes mellitus with other specified complication: Secondary | ICD-10-CM | POA: Diagnosis not present

## 2021-04-14 LAB — CBC WITH DIFFERENTIAL/PLATELET
Absolute Monocytes: 350 cells/uL (ref 200–950)
Basophils Absolute: 18 cells/uL (ref 0–200)
Basophils Relative: 0.4 %
Eosinophils Absolute: 101 cells/uL (ref 15–500)
Eosinophils Relative: 2.2 %
HCT: 43.6 % (ref 38.5–50.0)
Hemoglobin: 14.8 g/dL (ref 13.2–17.1)
Lymphs Abs: 1026 cells/uL (ref 850–3900)
MCH: 30.8 pg (ref 27.0–33.0)
MCHC: 33.9 g/dL (ref 32.0–36.0)
MCV: 90.6 fL (ref 80.0–100.0)
MPV: 9.1 fL (ref 7.5–12.5)
Monocytes Relative: 7.6 %
Neutro Abs: 3105 cells/uL (ref 1500–7800)
Neutrophils Relative %: 67.5 %
Platelets: 248 10*3/uL (ref 140–400)
RBC: 4.81 10*6/uL (ref 4.20–5.80)
RDW: 12.8 % (ref 11.0–15.0)
Total Lymphocyte: 22.3 %
WBC: 4.6 10*3/uL (ref 3.8–10.8)

## 2021-04-14 LAB — COMPLETE METABOLIC PANEL WITH GFR
AG Ratio: 2.1 (calc) (ref 1.0–2.5)
ALT: 20 U/L (ref 9–46)
AST: 21 U/L (ref 10–35)
Albumin: 4.2 g/dL (ref 3.6–5.1)
Alkaline phosphatase (APISO): 56 U/L (ref 35–144)
BUN: 15 mg/dL (ref 7–25)
CO2: 27 mmol/L (ref 20–32)
Calcium: 8.9 mg/dL (ref 8.6–10.3)
Chloride: 104 mmol/L (ref 98–110)
Creat: 0.88 mg/dL (ref 0.70–1.35)
Globulin: 2 g/dL (calc) (ref 1.9–3.7)
Glucose, Bld: 123 mg/dL — ABNORMAL HIGH (ref 65–99)
Potassium: 3.8 mmol/L (ref 3.5–5.3)
Sodium: 140 mmol/L (ref 135–146)
Total Bilirubin: 1.4 mg/dL — ABNORMAL HIGH (ref 0.2–1.2)
Total Protein: 6.2 g/dL (ref 6.1–8.1)
eGFR: 95 mL/min/{1.73_m2} (ref >=60)

## 2021-04-14 LAB — LIPID PANEL
Cholesterol: 108 mg/dL (ref <200)
HDL: 45 mg/dL (ref >=40)
LDL Cholesterol (Calc): 47 mg/dL (calc)
Non-HDL Cholesterol (Calc): 63 mg/dL (calc) (ref <130)
Total CHOL/HDL Ratio: 2.4 (calc) (ref <5.0)
Triglycerides: 75 mg/dL (ref <150)

## 2021-04-14 LAB — HEMOGLOBIN A1C
Hgb A1c MFr Bld: 6.5 % of total Hgb — ABNORMAL HIGH (ref <5.7)
Mean Plasma Glucose: 140 mg/dL
eAG (mmol/L): 7.7 mmol/L

## 2021-04-17 ENCOUNTER — Other Ambulatory Visit: Payer: Self-pay

## 2021-04-17 ENCOUNTER — Encounter: Payer: Self-pay | Admitting: Nurse Practitioner

## 2021-04-17 ENCOUNTER — Ambulatory Visit: Payer: BC Managed Care – PPO | Admitting: Nurse Practitioner

## 2021-04-17 ENCOUNTER — Telehealth: Payer: Self-pay | Admitting: *Deleted

## 2021-04-17 VITALS — BP 128/80 | HR 76 | Temp 97.5°F | Ht 67.5 in | Wt 213.8 lb

## 2021-04-17 DIAGNOSIS — Z9989 Dependence on other enabling machines and devices: Secondary | ICD-10-CM

## 2021-04-17 DIAGNOSIS — Z23 Encounter for immunization: Secondary | ICD-10-CM | POA: Diagnosis not present

## 2021-04-17 DIAGNOSIS — R0989 Other specified symptoms and signs involving the circulatory and respiratory systems: Secondary | ICD-10-CM

## 2021-04-17 DIAGNOSIS — E785 Hyperlipidemia, unspecified: Secondary | ICD-10-CM

## 2021-04-17 DIAGNOSIS — I4510 Unspecified right bundle-branch block: Secondary | ICD-10-CM

## 2021-04-17 DIAGNOSIS — E6609 Other obesity due to excess calories: Secondary | ICD-10-CM

## 2021-04-17 DIAGNOSIS — I10 Essential (primary) hypertension: Secondary | ICD-10-CM | POA: Diagnosis not present

## 2021-04-17 DIAGNOSIS — G4733 Obstructive sleep apnea (adult) (pediatric): Secondary | ICD-10-CM | POA: Diagnosis not present

## 2021-04-17 DIAGNOSIS — E1169 Type 2 diabetes mellitus with other specified complication: Secondary | ICD-10-CM

## 2021-04-17 DIAGNOSIS — Z6832 Body mass index (BMI) 32.0-32.9, adult: Secondary | ICD-10-CM

## 2021-04-17 NOTE — Telephone Encounter (Signed)
Order placed

## 2021-04-17 NOTE — Telephone Encounter (Signed)
Patient stated that he was returning [place] call.   He said to let Janett Billow know that he Agree's to the Cardiologist Referral.

## 2021-04-17 NOTE — Progress Notes (Signed)
Careteam: Patient Care Team: Lauree Chandler, NP as PCP - General (Geriatric Medicine) Lavonna Monarch, MD as Consulting Physician (Dermatology) Leandrew Koyanagi, MD as Attending Physician (Orthopedic Surgery) Roel Cluck, MD as Referring Physician (Ophthalmology) Deneise Lever, MD as Consulting Physician (Pulmonary Disease) Sheffield, Ronalee Red, PA-C as Physician Assistant (Dermatology)  PLACE OF SERVICE:  [place] Directive information Does Patient Have a Medical Advance Directive?: No, Would patient like information on creating a medical advance directive?: Yes (MAU/Ambulatory/Procedural Areas - Information given)  Allergies  Allergen Reactions   Fish Allergy Swelling    Tuna fish--- Swelling of lips     Chief Complaint  Patient presents with   Medical Management of Chronic Issues    4 month follow-up and discuss labs (copy printed). Examine lower legs, bilateral irritation x 1 month. Request to examine ears, feel clogged. Discuss EKG qualifications and recommendations. Flu vaccine today. Foot exam today.      HPI: Patient is a 65 y.o. male for routine follow up.   DM- at goal 6.5, no hypoglycemia. Continue on trulicity and metformin.   Htn- continues on benazepril increased at last visit to 40 mg and continues on HCTZ to 12.5  Hyperlipidemia- at goal on lipitor.   Allergies- continues to have fluid in his ears. Clears nose with saline. Continues on xyzal and mucinex.    OSA on CPAP  Review of Systems:  Review of Systems  Constitutional:  Negative for chills, fever and weight loss.  HENT:  Negative for tinnitus.   Respiratory:  Negative for cough, sputum production and shortness of breath.   Cardiovascular:  Negative for chest pain, palpitations and leg swelling.  Gastrointestinal:  Negative for abdominal pain, constipation, diarrhea and heartburn.  Genitourinary:  Negative for dysuria, frequency and urgency.  Musculoskeletal:  Negative for  back pain, falls, joint pain and myalgias.  Skin: Negative.   Neurological:  Negative for dizziness and headaches.  Psychiatric/Behavioral:  Negative for depression and memory loss. The patient does not have insomnia.    Past Medical History:  Diagnosis Date   Allergy    Cancer ([place])    skin cancer- squamous cell    Diabetes mellitus without complication ([place])    Type 2   H/O lymph node excision 1998   [place] , [place]   Hypertension    Sleep apnea    wears cpap    Squamous cell carcinoma of skin 12/31/2014   RIGHT FOREARM TX = CX3 5FU   Viral pneumonia 1965   Past Surgical History:  Procedure Laterality Date   COLONOSCOPY     LYMPH NODE DISSECTION  1998   perirectal abcess  1989   POLYPECTOMY     SKIN BIOPSY Right 05/21/15   forearm Robyne Askew PA   WISDOM TOOTH EXTRACTION     Social History:   reports that he has never smoked. He has never used smokeless tobacco. He reports current alcohol use of about 1.0 standard drink per week. He reports that he does not use drugs.  Family History  Problem Relation Age of Onset   Hypertension Mother    Diabetes Mother 48       Diabetes type 2   Heart disease Father 66       Heart Attack   Colon cancer Neg Hx    Colon polyps Neg Hx    Esophageal cancer Neg Hx    Rectal cancer Neg Hx    Stomach cancer Neg Hx  Medications: Patient's Medications  New Prescriptions   No medications on file  Previous Medications   ASPIRIN 81 MG TABLET    Take 81 mg by mouth daily.   ATORVASTATIN (LIPITOR) 20 MG TABLET    TAKE 1 TABLET BY MOUTH ONCE DAILY IN THE EVENING 6 PM   BENAZEPRIL (LOTENSIN) 40 MG TABLET    Take 1 tablet (40 mg total) by mouth daily.   CLOBETASOL (TEMOVATE) 0.05 % EXTERNAL SOLUTION    Apply 1 application topically 2 (two) times daily.   DEXTROMETHORPHAN-GUAIFENESIN (MUCINEX DM MAXIMUM STRENGTH PO)    Take by mouth 2 (two) times daily after a meal.   DULAGLUTIDE (TRULICITY) 7.62 GB/1.5VV SOPN    INJECT 1   SUBCUTANEOUSLY ONCE A WEEK   FLUOROURACIL (EFUDEX) 5 % CREAM    Apply to affected area qd for 2 weeks   FLUTICASONE (FLONASE) 50 MCG/ACT NASAL SPRAY    Use 2 spray(s) in each nostril once daily   HYDROCHLOROTHIAZIDE (MICROZIDE) 12.5 MG CAPSULE    Take 1 capsule (12.5 mg total) by mouth daily.   LEVOCETIRIZINE (XYZAL) 5 MG TABLET    Take 5 mg by mouth every evening.   METFORMIN (GLUCOPHAGE) 1000 MG TABLET    TAKE 1 TABLET BY MOUTH TWICE DAILY WITH A MEAL   MINOCYCLINE (MINOCIN) 100 MG CAPSULE    Take 1 capsule by mouth once daily   PSYLLIUM (REGULOID) 0.52 G CAPSULE    Take 0.52 g by mouth daily.   SITAGLIPTIN (JANUVIA) 100 MG TABLET    Take 1 tablet (100 mg total) by mouth daily. With largest meal  Modified Medications   No medications on file  Discontinued Medications   No medications on file    Physical Exam:  Vitals:   04/17/21 0940  BP: (!) 150/86  Pulse: 76  Temp: (!) 97.5 F (36.4 C)  TempSrc: Temporal  SpO2: 97%  Weight: 213 lb 12.8 oz (97 kg)  Height: 5' 7.5" (1.715 m)   Body mass index is 32.99 kg/m. Wt Readings from Last 3 Encounters:  04/17/21 213 lb 12.8 oz (97 kg)  12/12/20 212 lb (96.2 kg)  08/07/20 215 lb 3.2 oz (97.6 kg)    Physical Exam Constitutional:      General: He is not in acute distress.    Appearance: He is well-developed. He is not diaphoretic.  HENT:     Head: Normocephalic and atraumatic.     Right Ear: Tympanic membrane, ear canal and external ear normal.     Left Ear: Tympanic membrane, ear canal and external ear normal.     Nose: Nose normal.     Mouth/Throat:     Mouth: Mucous membranes are moist.     Pharynx: No oropharyngeal exudate.  Eyes:     Conjunctiva/sclera: Conjunctivae normal.     Pupils: Pupils are equal, round, and reactive to light.  Cardiovascular:     Rate and Rhythm: Normal rate and regular rhythm.     Pulses: Decreased pulses.     Heart sounds: Normal heart sounds.  Pulmonary:     Effort: Pulmonary effort is  normal.     Breath sounds: Normal breath sounds.  Abdominal:     General: Bowel sounds are normal.     Palpations: Abdomen is soft.  Musculoskeletal:        General: No tenderness.     Cervical back: Normal range of motion and neck supple.     Right lower leg: No edema.  Left lower leg: No edema.  Skin:    General: Skin is warm and dry.  Neurological:     Mental Status: He is alert and oriented to person, place, and time.    Labs reviewed: Basic Metabolic Panel: Recent Labs    08/04/20 0820 12/09/20 0835 04/13/21 0816  NA 139 140 140  K 3.9 3.6 3.8  CL 105 105 104  CO2 24 23 27   GLUCOSE 112* 128* 123*  BUN 16 18 15   CREATININE 0.93 0.84 0.88  CALCIUM 9.3 9.0 8.9   Liver Function Tests: Recent Labs    08/04/20 0820 12/09/20 0835 04/13/21 0816  AST 16 17 21   ALT 16 14 20   BILITOT 1.4* 1.1 1.4*  PROT 6.6 6.4 6.2   No results for input(s): LIPASE, AMYLASE in the last 8760 hours. No results for input(s): AMMONIA in the last 8760 hours. CBC: Recent Labs    08/04/20 0820 12/09/20 0835 04/13/21 0816  WBC 6.0 5.9 4.6  NEUTROABS 4,074 3,735 3,105  HGB 14.6 15.0 14.8  HCT 42.8 44.5 43.6  MCV 88.4 89.2 90.6  PLT 270 258 248   Lipid Panel: Recent Labs    08/04/20 0820 12/09/20 0835 04/13/21 0816  CHOL 110 116 108  HDL 45 40 45  LDLCALC 50 56 47  TRIG 73 116 75  CHOLHDL 2.4 2.9 2.4   TSH: No results for input(s): TSH in the last 8760 hours. A1C: Lab Results  Component Value Date   HGBA1C 6.5 (H) 04/13/2021     Assessment/Plan 1. Need for influenza vaccination - Flu Vaccine QUAD High Dose(Fluad)  2. Essential hypertension -improved on recheck.  Continue current medications Recheck metabolic panel - EKG [id number]  3. Type 2 diabetes mellitus with other specified complication, without long-term current use of insulin (HCC) -a1c at goal on current regimen. Will continue at this time -Encouraged dietary compliance, routine foot care/monitoring  and to keep up with diabetic eye exams through ophthalmology  - VAS [place] ABI WITH/WO TBI; Future  4. Hyperlipidemia associated with type 2 diabetes mellitus ([place]) -at goal on lipitor with dietary modifications.   5. OSA on CPAP -stable.   6. Right BBB -ongoing, cardiology consult for further evaluation and treatment if needed  7. Class 1 obesity due to excess calories with serious comorbidity and body mass index (BMI) of 32.0 to 32.9 in adult --education provided on healthy weight loss through increase in physical activity and proper nutrition   8. Diminished pulses in lower extremity -hx of diabetes with diminished pulses and hairless LE blaterally. Will get ABi for further evaluation. He does not have any pain with walking at this time or in LE at this time.  - VAS [place] ABI WITH/WO TBI; Future   Next appt:  [name]. [place], [facility] [phone]

## 2021-04-23 ENCOUNTER — Other Ambulatory Visit: Payer: Self-pay

## 2021-04-23 ENCOUNTER — Encounter: Payer: BC Managed Care – PPO | Admitting: Nurse Practitioner

## 2021-04-23 NOTE — Progress Notes (Deleted)
Subjective:   [name] is a 65 y.o. male who presents for Medicare Annual/Subsequent preventive examination.  Review of Systems           Objective:    There were no vitals filed for this visit. There is no height or weight on file to calculate BMI.  Advanced Directives 04/17/2021 12/12/2020 08/07/2020 03/31/2020 11/29/2019 07/27/2019 01/26/2018  Does Patient Have a Medical Advance Directive? No Yes No No No No No  Does patient want to make changes to medical advance directive? - Yes (MAU/Ambulatory/Procedural Areas - Information given) - - - - -  Copy of [place] in Chart? - - - - - - -  Would patient like information on creating a medical advance directive? Yes (MAU/Ambulatory/Procedural Areas - Information given) - No - Patient declined No - Patient declined - No - Patient declined No - Patient declined    Current Medications (verified) Outpatient Encounter Medications as of 04/23/2021  Medication Sig   aspirin 81 MG tablet Take 81 mg by mouth daily.   atorvastatin (LIPITOR) 20 MG tablet TAKE 1 TABLET BY MOUTH ONCE DAILY IN THE EVENING 6 PM   benazepril (LOTENSIN) 40 MG tablet Take 1 tablet (40 mg total) by mouth daily.   clobetasol (TEMOVATE) 0.05 % external solution Apply 1 application topically 2 (two) times daily.   Dextromethorphan-guaiFENesin ([place] DM MAXIMUM STRENGTH PO) Take by mouth 2 (two) times daily after a meal.   Dulaglutide (TRULICITY) 1.51 VO/1.6WV SOPN INJECT 1  SUBCUTANEOUSLY ONCE A WEEK   fluorouracil (EFUDEX) 5 % cream Apply to affected area qd for 2 weeks   fluticasone (FLONASE) 50 MCG/ACT nasal spray Use 2 spray(s) in each nostril once daily   hydrochlorothiazide (MICROZIDE) 12.5 MG capsule Take 1 capsule (12.5 mg total) by mouth daily.   levocetirizine (XYZAL) 5 MG tablet Take 5 mg by mouth every evening.   metFORMIN (GLUCOPHAGE) 1000 MG tablet TAKE 1 TABLET BY MOUTH TWICE DAILY WITH A MEAL   minocycline (MINOCIN) 100 MG capsule Take  1 capsule by mouth once daily   psyllium (REGULOID) 0.52 G capsule Take 0.52 g by mouth daily.   sitaGLIPtin (JANUVIA) 100 MG tablet Take 1 tablet (100 mg total) by mouth daily. With largest meal   No facility-administered encounter medications on file as of 04/23/2021.    Allergies (verified) Fish allergy   History: Past Medical History:  Diagnosis Date   Allergy    Cancer ([place])    skin cancer- squamous cell    Diabetes mellitus without complication ([place])    Type 2   H/O lymph node excision 1998   [place] , [place]   Hypertension    Sleep apnea    wears cpap    Squamous cell carcinoma of skin 12/31/2014   RIGHT FOREARM TX = CX3 5FU   Viral pneumonia 1965   Past Surgical History:  Procedure Laterality Date   COLONOSCOPY     LYMPH NODE DISSECTION  1998   perirectal abcess  1989   POLYPECTOMY     SKIN BIOPSY Right 05/21/15   forearm Vida Roller Sheffield PA   WISDOM TOOTH EXTRACTION     Family History  Problem Relation Age of Onset   Hypertension Mother    Diabetes Mother 71       Diabetes type 2   Heart disease Father 62       Heart Attack   Colon cancer Neg Hx    Colon polyps Neg Hx  Esophageal cancer Neg Hx    Rectal cancer Neg Hx    Stomach cancer Neg Hx    Social History   Socioeconomic History   Marital status: Married    Spouse name: Not on file   Number of children: Not on file   Years of education: Not on file   Highest education level: Not on file  Occupational History   Not on file  Tobacco Use   Smoking status: Never   Smokeless tobacco: Never  Vaping Use   Vaping Use: Never used  Substance and Sexual Activity   Alcohol use: Yes    Alcohol/week: 1.0 standard drink    Types: 1 Standard drinks or equivalent per week    Comment: social   Drug use: No   Sexual activity: Not Currently  Other Topics Concern   Not on file  Social History Narrative   Diet: No soda, limited sweets such as candy and pastries.   Caffeine: Yes-Unsweetened ice tea    Marital status: Married  Year: 1983   Do you live in a  House, how many stories: 2   How many persons live in your home: 2 and 1 temporarily   Do you have any pets in the home:Yes, Dog   Do you exercise: yes, walking, several times per week   Social Determinants of Health   Financial Resource Strain: Not on file  Food Insecurity: Not on file  Transportation Needs: Not on file  Physical Activity: Not on file  Stress: Not on file  Social Connections: Not on file    Tobacco Counseling Counseling given: Not Answered   Clinical Intake:                 Diabetic?yes         Activities of Daily Living No flowsheet data found.  Patient Care Team: Lauree Chandler, NP as PCP - General (Geriatric Medicine) Lavonna Monarch, MD as Consulting Physician (Dermatology) Leandrew Koyanagi, MD as Attending Physician (Orthopedic Surgery) Roel Cluck, MD as Referring Physician (Ophthalmology) Deneise Lever, MD as Consulting Physician (Pulmonary Disease) Warren Danes, PA-C as Physician Assistant (Dermatology)  Indicate any recent Medical Services you may have received from other than [facility] providers in the past year (date may be approximate).     Assessment:   This is a routine wellness examination for [name].  Hearing/Vision screen No results found.  Dietary issues and exercise activities discussed:     Goals Addressed   None    Depression Screen PHQ 2/9 Scores 04/17/2021 12/12/2020 03/31/2020 11/29/2019 07/27/2019 02/12/2019 10/09/2018  PHQ - 2 Score 0 0 0 0 0 0 0    Fall Risk Fall Risk  04/17/2021 12/12/2020 08/07/2020 03/31/2020 11/29/2019  Falls in the past year? 0 0 0 0 0  Number falls in past yr: 0 0 0 0 0  Injury with Fall? 0 0 0 0 0  Risk for fall due to : No Fall Risks No Fall Risks - - -  Follow up Falls evaluation completed Falls evaluation completed - - -    FALL RISK PREVENTION PERTAINING TO THE HOME:  Any stairs in or around the home?  {YES/NO:21197} If so, are there any without handrails? {YES/NO:21197} Home free of loose throw rugs in walkways, pet beds, electrical cords, etc? {YES/NO:21197} Adequate lighting in your home to reduce risk of falls? {YES/NO:21197}  ASSISTIVE DEVICES UTILIZED TO PREVENT FALLS:  Life alert? {YES/NO:21197} Use of a cane, walker or w/c? {  YES/NO:21197} Grab bars in the bathroom? {YES/NO:21197} Shower chair or bench in shower? {YES/NO:21197} Elevated toilet seat or a handicapped toilet? {YES/NO:21197}  TIMED UP AND GO:  Was the test performed? No .    Cognitive Function:        Immunizations Immunization History  Administered Date(s) Administered   Fluad Quad(high Dose 65+) 02/12/2019, 03/31/2020, 04/17/2021   Hepatitis B 04/21/2010, 05/21/2010   Influenza,inj,Quad PF,6+ Mos 02/27/2015, 03/30/2018   Influenza-Unspecified 04/04/2014, 02/27/2015, 03/24/2016, 04/02/2017, 03/30/2018   PFIZER(Purple Top)SARS-COV-2 Vaccination 08/25/2019, 09/15/2019, 04/16/2020, 12/01/2020, 04/03/2021   Pneumococcal Conjugate-13 09/02/2014   Pneumococcal Polysaccharide-23 03/21/2006, 09/10/2016   Tdap 01/20/2007, 09/19/2017   Zoster Recombinat (Shingrix) 10/23/2017, 12/27/2017   Zoster, Live 05/07/2016    TDAP status: Up to date  Flu Vaccine status: Up to date  Pneumococcal vaccine status: Up to date  Covid-19 vaccine status: Information provided on how to obtain vaccines.   Qualifies for Shingles Vaccine? Yes   Zostavax completed Yes   Shingrix Completed?: Yes  Screening Tests Health Maintenance  Topic Date Due   FOOT EXAM  03/31/2021   OPHTHALMOLOGY EXAM  05/05/2021   COVID-19 Vaccine (6 - Booster for Pfizer series) 05/29/2021   Pneumonia Vaccine 46+ Years old (54 - PPSV23 if available, else PCV20) 09/10/2021   HEMOGLOBIN A1C  10/12/2021   COLONOSCOPY (Pts 45-78yrs Insurance coverage will need to be confirmed)  05/10/2022   TETANUS/TDAP  09/20/2027   INFLUENZA VACCINE  Completed    Hepatitis C Screening  Completed   Zoster Vaccines- Shingrix  Completed   HIV Screening  Addressed   HPV VACCINES  Aged Out    Health Maintenance  Health Maintenance Due  Topic Date Due   FOOT EXAM  03/31/2021    Colorectal cancer screening: Type of screening: Colonoscopy. Completed 2020. Repeat every 3 years  Lung Cancer Screening: (Low Dose CT Chest recommended if Age 73-80 years, 30 pack-year currently smoking OR have quit w/in 15years.) does not qualify.   Lung Cancer Screening Referral: na  Additional Screening:  Hepatitis C Screening: does qualify; Completed   Vision Screening: Recommended annual ophthalmology exams for early detection of glaucoma and other disorders of the eye. Is the patient up to date with their annual eye exam?  Yes  Who is the provider or what is the name of the office in which the patient attends annual eye exams? [facility]. If pt is not established with a provider, would they like to be referred to a provider to establish care? No .   Dental Screening: Recommended annual dental exams for proper oral hygiene  Community Resource Referral / Chronic Care Management: CRR required this visit?  No   CCM required this visit?  No      Plan:     I have personally reviewed and noted the following in the patient's chart:   Medical and social history Use of alcohol, tobacco or illicit drugs  Current medications and supplements including opioid prescriptions. Patient is not currently taking opioid prescriptions. Functional ability and status Nutritional status Physical activity Advanced directives List of other physicians Hospitalizations, surgeries, and ER visits in previous 12 months Vitals Screenings to include cognitive, depression, and falls Referrals and appointments  In addition, I have reviewed and discussed with patient certain preventive protocols, quality metrics, and best practice recommendations. A written personalized care plan  for preventive services as well as general preventive health recommendations were provided to patient.     Lauree Chandler, NP   04/23/2021  Virtual Visit via Telephone Note  I connected withNAME@ on 04/23/21 at  9:00 AM EDT by telephone and verified that I am speaking with the correct person using two identifiers.  Location: Patient: home Provider: [facility]   I discussed the limitations, risks, security and privacy concerns of performing an evaluation and management service by telephone and the availability of in person appointments. I also discussed with the patient that there may be a patient responsible charge related to this service. The patient expressed understanding and agreed to proceed.   I discussed the assessment and treatment plan with the patient. The patient was provided an opportunity to ask questions and all were answered. The patient agreed with the plan and demonstrated an understanding of the instructions.   The patient was advised to call back or seek an in-person evaluation if the symptoms worsen or if the condition fails to improve as anticipated.  I provided 15 minutes of non-face-to-face time during this encounter.  Carlos American. Harle Battiest Avs printed and mailed

## 2021-04-24 ENCOUNTER — Encounter: Payer: Self-pay | Admitting: Nurse Practitioner

## 2021-04-29 ENCOUNTER — Other Ambulatory Visit: Payer: Self-pay | Admitting: Dermatology

## 2021-04-29 DIAGNOSIS — L739 Follicular disorder, unspecified: Secondary | ICD-10-CM

## 2021-05-11 ENCOUNTER — Other Ambulatory Visit: Payer: Self-pay | Admitting: Nurse Practitioner

## 2021-05-11 DIAGNOSIS — E119 Type 2 diabetes mellitus without complications: Secondary | ICD-10-CM | POA: Diagnosis not present

## 2021-05-11 DIAGNOSIS — H2513 Age-related nuclear cataract, bilateral: Secondary | ICD-10-CM | POA: Diagnosis not present

## 2021-05-11 DIAGNOSIS — H52201 Unspecified astigmatism, right eye: Secondary | ICD-10-CM | POA: Diagnosis not present

## 2021-05-11 DIAGNOSIS — Z7984 Long term (current) use of oral hypoglycemic drugs: Secondary | ICD-10-CM | POA: Diagnosis not present

## 2021-05-11 DIAGNOSIS — H5213 Myopia, bilateral: Secondary | ICD-10-CM | POA: Diagnosis not present

## 2021-05-11 DIAGNOSIS — E1169 Type 2 diabetes mellitus with other specified complication: Secondary | ICD-10-CM

## 2021-05-11 DIAGNOSIS — H524 Presbyopia: Secondary | ICD-10-CM | POA: Diagnosis not present

## 2021-05-11 DIAGNOSIS — H35361 Drusen (degenerative) of macula, right eye: Secondary | ICD-10-CM | POA: Diagnosis not present

## 2021-05-11 LAB — HM DIABETES EYE EXAM

## 2021-05-11 NOTE — Telephone Encounter (Signed)
Patient has request refill on medication "Trulicity". Patient last refill was 81/2022. Medication has Warnings. Medication pend and sent to PCP Dewaine Oats Carlos American, NP for approval. Please Advise.

## 2021-05-19 ENCOUNTER — Other Ambulatory Visit: Payer: Self-pay | Admitting: Nurse Practitioner

## 2021-05-19 DIAGNOSIS — R0989 Other specified symptoms and signs involving the circulatory and respiratory systems: Secondary | ICD-10-CM

## 2021-05-22 ENCOUNTER — Other Ambulatory Visit: Payer: Self-pay

## 2021-05-22 ENCOUNTER — Ambulatory Visit (HOSPITAL_COMMUNITY)
Admission: RE | Admit: 2021-05-22 | Discharge: 2021-05-22 | Disposition: A | Discharge status: Home / Self Care | Payer: BC Managed Care – PPO | Source: Ambulatory Visit | Attending: Physician Assistant | Admitting: Physician Assistant

## 2021-05-22 DIAGNOSIS — R0989 Other specified symptoms and signs involving the circulatory and respiratory systems: Secondary | ICD-10-CM | POA: Insufficient documentation

## 2021-05-25 NOTE — Progress Notes (Signed)
Cardiology Office Note   Date:  05/26/2021   ID:  [name], DOB [birth year], MRN [id number]  PCP:  Lauree Chandler, NP    No chief complaint on file.  RBBB  Wt Readings from Last 3 Encounters:  05/26/21 215 lb 6.4 oz (97.7 kg)  04/17/21 213 lb 12.8 oz (97 kg)  12/12/20 212 lb (96.2 kg)       History of Present Illness: [name] is a 65 y.o. male who is being seen today for the evaluation of right bundle branch block at the request of Lauree Chandler, NP.   He has several risk factors for heart disease including diabetes, hypertension, hyperlipidemia.  He is treated for sleep apnea with CPAP.  Father had MI at 76.  Mother alive at 50- HTN, DM.  Denies : Chest pain. Dizziness. Leg edema. Nitroglycerin use. Orthopnea. Palpitations. Paroxysmal nocturnal dyspnea. Shortness of breath. Syncope.    Walks 30 mintues, 5 x/week.   Past Medical History:  Diagnosis Date   Allergy    Cancer ([name])    skin cancer- squamous cell    Diabetes mellitus without complication ([name])    Type 2   H/O lymph node excision 1998   [name] , [place]   Hypertension    Sleep apnea    wears cpap    Squamous cell carcinoma of skin 12/31/2014   RIGHT FOREARM TX = CX3 5FU   Viral pneumonia 1965    Past Surgical History:  Procedure Laterality Date   COLONOSCOPY     LYMPH NODE DISSECTION  1998   perirectal abcess  1989   POLYPECTOMY     SKIN BIOPSY Right 05/21/15   forearm Vida Roller [place] PA   WISDOM TOOTH EXTRACTION       Current Outpatient Medications  Medication Sig Dispense Refill   aspirin 81 MG tablet Take 81 mg by mouth daily.     atorvastatin (LIPITOR) 20 MG tablet TAKE 1 TABLET BY MOUTH ONCE DAILY IN THE EVENING 6 PM 90 tablet 1   benazepril (LOTENSIN) 40 MG tablet Take 1 tablet (40 mg total) by mouth daily. 90 tablet 3   clobetasol (TEMOVATE) 0.05 % external solution Apply 1 application topically 2 (two) times daily. 50 mL 6   Dextromethorphan-guaiFENesin ([place]UCIN[place] DM  MAXIMUM STRENGTH PO) Take by mouth 2 (two) times daily after a meal.     Dulaglutide (TRULICITY) 3.81 OF/7.5ZW SOPN INJECT 1 PEN SUBCUTANEOUSLY SUBCUTANEOUSLY ONCE A WEEK 12 mL 1   fluorouracil (EFUDEX) 5 % cream Apply to affected area qd for 2 weeks 40 g 1   fluticasone (FLONASE) 50 MCG/ACT nasal spray Use 2 spray(s) in each nostril once daily 16 g 11   hydrochlorothiazide (MICROZIDE) 12.5 MG capsule Take 1 capsule (12.5 mg total) by mouth daily. 90 capsule 1   levocetirizine (XYZAL) 5 MG tablet Take 5 mg by mouth every evening.     metFORMIN (GLUCOPHAGE) 1000 MG tablet TAKE 1 TABLET BY MOUTH TWICE DAILY WITH A MEAL 180 tablet 1   minocycline (MINOCIN) 100 MG capsule Take 1 capsule by mouth once daily 30 capsule 0   psyllium (REGULOID) 0.52 G capsule Take 0.52 g by mouth daily.     sitaGLIPtin (JANUVIA) 100 MG tablet Take 1 tablet (100 mg total) by mouth daily. With largest meal 90 tablet 3   minocycline (MINOCIN) 100 MG capsule Take 100 mg by mouth daily. (Patient not taking: Reported on 05/26/2021)     No current facility-administered medications  for this visit.    Allergies:   Fish allergy    Social History:  The patient  reports that he has never smoked. He has never used smokeless tobacco. He reports current alcohol use of about 1.0 standard drink per week. He reports that he does not use drugs.   Family History:  The patient's family history includes Diabetes (age of onset: 36) in his mother; Heart disease (age of onset: 75) in his father; Hypertension in his mother.    ROS:  Please see the history of present illness.   Otherwise, review of systems are positive for occasional LE edema.   All other systems are reviewed and negative.    PHYSICAL EXAM: VS:  BP 136/88   Pulse 97   Ht 5\' 8"  (1.727 m)   Wt 215 lb 6.4 oz (97.7 kg)   SpO2 95%   BMI 32.75 kg/m  , BMI Body mass index is 32.75 kg/m. GEN: Well nourished, well developed, in no acute distress HEENT: normal Neck: no JVD,  carotid bruits, or masses Cardiac: RRR; no murmurs, rubs, or gallops,no edema  Respiratory:  clear to auscultation bilaterally, normal work of breathing GI: soft, nontender, nondistended, + BS MS: no deformity or atrophy; very mild skin discoloration in bilateral LE  Skin: warm and dry, no rash Neuro:  Strength and sensation are intact Psych: euthymic mood, full affect   EKG:   The ekg ordered today demonstrates NSR, RBBB   Recent Labs: 04/13/2021: ALT 20; BUN 15; Creat 0.88; Hemoglobin 14.8; Platelets 248; Potassium 3.8; Sodium 140   Lipid Panel    Component Value Date/Time   CHOL 108 04/13/2021 0816   CHOL 97 (L) 03/10/2015 0909   TRIG 75 04/13/2021 0816   HDL 45 04/13/2021 0816   HDL 45 03/10/2015 0909   CHOLHDL 2.4 04/13/2021 0816   VLDL 14 01/13/2017 0817   LDLCALC 47 04/13/2021 0816     Other studies Reviewed: Additional studies/ records that were reviewed today with results demonstrating: labs reviewed; Normal ABIs bilaterally.   ASSESSMENT AND PLAN:  Right bundle branch block:  THis has been present since 2018. Hyperlipidemia with type 2 diabetes: Whole food, plant-based diet recommended.  A1C 6.5.  Walks 150 minutes/wek.  Hypertension: Avoid processed food.  Low-salt diet.  Checks occasionally at home and it is controlled. No elevated readings.  Always under 140/90.   Target < 130/80.  PMD had concern of diminished pulses in his lower extremities.  Vascular evaluation was suggested.  He also had some hair loss on his lower extremities as well. OSA: Continue CPAP. obesity: Exercise to the target noted below.  If he does have some peripheral vascular disease, walking would be beneficial to help circulation. Plan for calcium scoring test to further assess risk.  Adjust preventive therapy based on calcium score. No smoking history.  No family of AAA.   Current medicines are reviewed at length with the patient today.  The patient concerns regarding his medicines were  addressed.  The following changes have been made:  No change  Labs/ tests ordered today include:  No orders of the defined types were placed in this encounter.   Recommend 150 minutes/week of aerobic exercise Low fat, low carb, high fiber diet recommended  Disposition:   FU in 1 year   Signed, Larae Grooms, MD  05/26/2021 9:07 AM    [facility] [address], [place], [place]  [address] Phone: [phone]; Fax: [phone]

## 2021-05-26 ENCOUNTER — Encounter: Payer: Self-pay | Admitting: Interventional Cardiology

## 2021-05-26 ENCOUNTER — Ambulatory Visit: Payer: BC Managed Care – PPO | Admitting: Interventional Cardiology

## 2021-05-26 ENCOUNTER — Other Ambulatory Visit: Payer: Self-pay

## 2021-05-26 VITALS — BP 136/88 | HR 97 | Ht 68.0 in | Wt 215.4 lb

## 2021-05-26 DIAGNOSIS — E1159 Type 2 diabetes mellitus with other circulatory complications: Secondary | ICD-10-CM | POA: Diagnosis not present

## 2021-05-26 DIAGNOSIS — E782 Mixed hyperlipidemia: Secondary | ICD-10-CM

## 2021-05-26 DIAGNOSIS — I10 Essential (primary) hypertension: Secondary | ICD-10-CM | POA: Diagnosis not present

## 2021-05-26 DIAGNOSIS — I4510 Unspecified right bundle-branch block: Secondary | ICD-10-CM | POA: Diagnosis not present

## 2021-05-26 NOTE — Patient Instructions (Signed)
Medication Instructions:  Your physician recommends that you continue on your current medications as directed. Please refer to the Current Medication list given to you today.  *If you need a refill on your cardiac medications before your next appointment, please call your pharmacy*   Lab Work: none If you have labs (blood work) drawn today and your tests are completely normal, you will receive your results only by: [facility] (if you have MyChart) OR A paper copy in the mail If you have any lab test that is abnormal or we need to change your treatment, we will call you to review the results.   Testing/Procedures: Dr Irish Lack recommends you have a Calcium Score CT Scan done   Follow-Up: At [facility], you and your health needs are our priority.  As part of our continuing mission to provide you with exceptional heart care, we have created designated Provider Care Teams.  These Care Teams include your primary Cardiologist (physician) and Advanced Practice Providers (APPs -  Physician Assistants and Nurse Practitioners) who all work together to provide you with the care you need, when you need it.  We recommend signing up for the patient portal called "MyChart".  Sign up information is provided on this After Visit Summary.  MyChart is used to connect with patients for Virtual Visits (Telemedicine).  Patients are able to view lab/test results, encounter notes, upcoming appointments, etc.  Non-urgent messages can be sent to your provider as well.   To learn more about what you can do with MyChart, go to [email or web address].    Your next appointment:   12 month(s)  The format for your next appointment:   In Person  Provider:   Larae Grooms, MD     Other Instructions  High-Fiber Eating Plan Fiber, also called dietary fiber, is a type of carbohydrate. It is found foods such as fruits, vegetables, whole grains, and beans. A high-fiber diet can have many health benefits.  Your health care provider may recommend a high-fiber diet to help: Prevent constipation. Fiber can make your bowel movements more regular. Lower your cholesterol. Relieve the following conditions: Inflammation of veins in the anus (hemorrhoids). Inflammation of specific areas of the digestive tract (uncomplicated diverticulosis). A problem of the large intestine, also called the colon, that sometimes causes pain and diarrhea (irritable bowel syndrome, or IBS). Prevent overeating as part of a weight-loss plan. Prevent heart disease, type 2 diabetes, and certain cancers. What are tips for following this plan? Reading food labels  Check the nutrition facts label on food products for the amount of dietary fiber. Choose foods that have 5 grams of fiber or more per serving. The goals for recommended daily fiber intake include: Men (age 32 or younger): 34-38 g. Men (over age 54): 28-34 g. Women (age 48 or younger): 25-28 g. Women (over age 24): 22-25 g. Your daily fiber goal is _____________ g. Shopping Choose whole fruits and vegetables instead of processed forms, such as apple juice or applesauce. Choose a wide variety of high-fiber foods such as avocados, lentils, oats, and kidney beans. Read the nutrition facts label of the foods you choose. Be aware of foods with added fiber. These foods often have high sugar and sodium amounts per serving. Cooking Use whole-grain flour for baking and cooking. Cook with brown rice instead of white rice. Meal planning Start the day with a breakfast that is high in fiber, such as a cereal that contains 5 g of fiber or more per serving. Eat breads  and cereals that are made with whole-grain flour instead of refined flour or white flour. Eat brown rice, bulgur wheat, or millet instead of white rice. Use beans in place of meat in soups, salads, and pasta dishes. Be sure that half of the grains you eat each day are whole grains. General information You can  get the recommended daily intake of dietary fiber by: Eating a variety of fruits, vegetables, grains, nuts, and beans. Taking a fiber supplement if you are not able to take in enough fiber in your diet. It is better to get fiber through food than from a supplement. Gradually increase how much fiber you consume. If you increase your intake of dietary fiber too quickly, you may have bloating, cramping, or gas. Drink plenty of water to help you digest fiber. Choose high-fiber snacks, such as berries, raw vegetables, nuts, and popcorn. What foods should I eat? Fruits Berries. Pears. Apples. Oranges. Avocado. Prunes and raisins. Dried figs. Vegetables Sweet potatoes. Spinach. Kale. Artichokes. Cabbage. Broccoli. Cauliflower. Green peas. Carrots. Squash. Grains Whole-grain breads. Multigrain cereal. Oats and oatmeal. Brown rice. Barley. Bulgur wheat. [place]. Quinoa. Bran muffins. Popcorn. Rye wafer crackers. Meats and other proteins Navy beans, kidney beans, and pinto beans. Soybeans. Split peas. Lentils. Nuts and seeds. Dairy Fiber-fortified yogurt. Beverages Fiber-fortified soy milk. Fiber-fortified orange juice. Other foods Fiber bars. The items listed above may not be a complete list of recommended foods and beverages. Contact a dietitian for more information. What foods should I avoid? Fruits Fruit juice. Cooked, strained fruit. Vegetables Fried potatoes. Canned vegetables. Well-cooked vegetables. Grains White bread. Pasta made with refined flour. White rice. Meats and other proteins Fatty cuts of meat. Fried chicken or fried fish. Dairy Milk. Yogurt. Cream cheese. Sour cream. Fats and oils Butters. Beverages Soft drinks. Other foods Cakes and pastries. The items listed above may not be a complete list of foods and beverages to avoid. Talk with your dietitian about what choices are best for you. Summary Fiber is a type of carbohydrate. It is found in foods such as fruits,  vegetables, whole grains, and beans. A high-fiber diet has many benefits. It can help to prevent constipation, lower blood cholesterol, aid weight loss, and reduce your risk of heart disease, diabetes, and certain cancers. Increase your intake of fiber gradually. Increasing fiber too quickly may cause cramping, bloating, and gas. Drink plenty of water while you increase the amount of fiber you consume. The best sources of fiber include whole fruits and vegetables, whole grains, nuts, seeds, and beans. This information is not intended to replace advice given to you by your health care provider. Make sure you discuss any questions you have with your health care provider. Document Revised: 10/11/2019 Document Reviewed: 10/11/2019 Elsevier Patient Education  2022 [employer].

## 2021-05-31 ENCOUNTER — Other Ambulatory Visit: Payer: Self-pay | Admitting: Dermatology

## 2021-05-31 DIAGNOSIS — L739 Follicular disorder, unspecified: Secondary | ICD-10-CM

## 2021-06-17 ENCOUNTER — Other Ambulatory Visit: Payer: Self-pay | Admitting: Nurse Practitioner

## 2021-06-17 DIAGNOSIS — I10 Essential (primary) hypertension: Secondary | ICD-10-CM

## 2021-06-25 ENCOUNTER — Other Ambulatory Visit: Payer: Self-pay

## 2021-06-25 ENCOUNTER — Ambulatory Visit (INDEPENDENT_AMBULATORY_CARE_PROVIDER_SITE_OTHER)
Admission: RE | Admit: 2021-06-25 | Discharge: 2021-06-25 | Disposition: A | Discharge status: Home / Self Care | Payer: Self-pay | Source: Ambulatory Visit | Attending: Interventional Cardiology | Admitting: Interventional Cardiology

## 2021-06-25 DIAGNOSIS — E1159 Type 2 diabetes mellitus with other circulatory complications: Secondary | ICD-10-CM

## 2021-06-25 DIAGNOSIS — I4510 Unspecified right bundle-branch block: Secondary | ICD-10-CM

## 2021-06-25 DIAGNOSIS — I10 Essential (primary) hypertension: Secondary | ICD-10-CM

## 2021-06-25 DIAGNOSIS — E782 Mixed hyperlipidemia: Secondary | ICD-10-CM

## 2021-06-26 ENCOUNTER — Telehealth: Payer: Self-pay

## 2021-06-26 DIAGNOSIS — E1159 Type 2 diabetes mellitus with other circulatory complications: Secondary | ICD-10-CM

## 2021-06-26 DIAGNOSIS — E782 Mixed hyperlipidemia: Secondary | ICD-10-CM

## 2021-06-26 MED ORDER — ATORVASTATIN CALCIUM 40 MG PO TABS: 40.0000 mg | ORAL_TABLET | Freq: Every day | ORAL | 1 refills | Status: DC

## 2021-06-26 NOTE — Telephone Encounter (Signed)
Spoke with patient regarding Ca score results and recommendation from JV to increase Lipitor dose. Pt agreed to plan of care and lab appt for 4/6 has been scheduled at this time. Judson Roch, RN

## 2021-06-26 NOTE — Telephone Encounter (Signed)
-----   Message from Jettie Booze, MD sent at 06/26/2021  9:19 AM EST ----- Calcium score mildly elevated.  Given diabetes, WOuld increase atorvastatin to 40 mg daily for better risk reduction.  Liver and lipids in 3 months.

## 2021-06-30 ENCOUNTER — Other Ambulatory Visit: Payer: Self-pay

## 2021-06-30 ENCOUNTER — Ambulatory Visit: Payer: BC Managed Care – PPO | Admitting: Physician Assistant

## 2021-06-30 ENCOUNTER — Encounter: Payer: Self-pay | Admitting: Physician Assistant

## 2021-06-30 DIAGNOSIS — L570 Actinic keratosis: Secondary | ICD-10-CM | POA: Diagnosis not present

## 2021-06-30 DIAGNOSIS — L739 Follicular disorder, unspecified: Secondary | ICD-10-CM

## 2021-06-30 DIAGNOSIS — L821 Other seborrheic keratosis: Secondary | ICD-10-CM | POA: Diagnosis not present

## 2021-06-30 DIAGNOSIS — Z1283 Encounter for screening for malignant neoplasm of skin: Secondary | ICD-10-CM | POA: Diagnosis not present

## 2021-06-30 MED ORDER — MINOCYCLINE HCL 100 MG PO CAPS: 100.0000 mg | ORAL_CAPSULE | Freq: Every day | ORAL | 6 refills | Status: DC

## 2021-06-30 NOTE — Progress Notes (Signed)
° °  Follow-Up Visit   Subjective  [name] is a 66 y.o. male who presents for the following: Annual Exam (Right rear rim lesion that comes and goes, left inner  thigh x years just wants it rechecked. Tolak used on the forehead and forearms x 14 days patient stated he stopped cream yesterday. Aldo wants refill on the minocycline ).   The following portions of the chart were reviewed this encounter and updated as appropriate:  Tobacco   Allergies   Meds   Problems   Med Hx   Surg Hx   Fam Hx       Objective  Well appearing patient in no apparent distress; mood and affect are within normal limits.  A focused examination was performed including face, arms and legs. Relevant physical exam findings are noted in the Assessment and Plan.  Left Forehead, Left Superior Helix, Right Forehead, Right Inferior Helix Erythematous patches with gritty scale.  Mid Parietal Scalp Perifollicular erythematous papules and pustules    Assessment & Plan  Actinic keratosis (4) Left Superior Helix; Right Inferior Helix; Left Forehead; Right Forehead  Pt aware to retreat face,and ears with Tolak and follow up in March.   Destruction of lesion - Left Forehead, Left Superior Helix, Right Forehead, Right Inferior Helix Complexity: simple   Destruction method: cryotherapy   Informed consent: discussed and consent obtained   Timeout:  patient name, date of birth, surgical site, and procedure verified Lesion destroyed using liquid nitrogen: Yes   Cryotherapy cycles:  3 Outcome: patient tolerated procedure well with no complications   Post-procedure details: wound care instructions given    Folliculitis Mid Parietal Scalp  minocycline (MINOCIN) 100 MG capsule - Mid Parietal Scalp Take 1 capsule (100 mg total) by mouth daily.  Seborrheic Keratosis- left anterior thigh - brown crusty plaque - Discussed benign etiology and prognosis. - Observe - Call for any changes    No atypical nevi noted at  the time of the visit. I, Amea Mcphail, PA-C, have reviewed all documentation's for this visit.  The documentation on 06/30/21 for the exam, diagnosis, procedures and orders are all accurate and complete.

## 2021-07-08 HISTORY — PX: MOLE REMOVAL: SHX2046

## 2021-07-30 ENCOUNTER — Other Ambulatory Visit: Payer: Self-pay | Admitting: Nurse Practitioner

## 2021-07-30 DIAGNOSIS — E119 Type 2 diabetes mellitus without complications: Secondary | ICD-10-CM

## 2021-08-17 ENCOUNTER — Other Ambulatory Visit: Payer: BC Managed Care – PPO

## 2021-08-21 ENCOUNTER — Other Ambulatory Visit: Payer: Self-pay

## 2021-08-21 ENCOUNTER — Other Ambulatory Visit: Payer: BC Managed Care – PPO

## 2021-08-21 DIAGNOSIS — E1169 Type 2 diabetes mellitus with other specified complication: Secondary | ICD-10-CM | POA: Diagnosis not present

## 2021-08-22 LAB — COMPLETE METABOLIC PANEL WITH GFR
AG Ratio: 2.1 (calc) (ref 1.0–2.5)
ALT: 16 U/L (ref 9–46)
AST: 16 U/L (ref 10–35)
Albumin: 4.2 g/dL (ref 3.6–5.1)
Alkaline phosphatase (APISO): 58 U/L (ref 35–144)
BUN: 18 mg/dL (ref 7–25)
CO2: 28 mmol/L (ref 20–32)
Calcium: 9.2 mg/dL (ref 8.6–10.3)
Chloride: 104 mmol/L (ref 98–110)
Creat: 0.97 mg/dL (ref 0.70–1.35)
Globulin: 2 g/dL (calc) (ref 1.9–3.7)
Glucose, Bld: 116 mg/dL — ABNORMAL HIGH (ref 65–99)
Potassium: 4.4 mmol/L (ref 3.5–5.3)
Sodium: 141 mmol/L (ref 135–146)
Total Bilirubin: 1.4 mg/dL — ABNORMAL HIGH (ref 0.2–1.2)
Total Protein: 6.2 g/dL (ref 6.1–8.1)
eGFR: 87 mL/min/{1.73_m2} (ref >=60)

## 2021-08-22 LAB — HEMOGLOBIN A1C
Hgb A1c MFr Bld: 7.3 % of total Hgb — ABNORMAL HIGH (ref <5.7)
Mean Plasma Glucose: 163 mg/dL
eAG (mmol/L): 9 mmol/L

## 2021-08-26 ENCOUNTER — Ambulatory Visit: Payer: BC Managed Care – PPO | Admitting: Nurse Practitioner

## 2021-08-28 ENCOUNTER — Ambulatory Visit: Payer: BC Managed Care – PPO | Admitting: Nurse Practitioner

## 2021-08-31 ENCOUNTER — Encounter: Payer: Self-pay | Admitting: Nurse Practitioner

## 2021-08-31 ENCOUNTER — Ambulatory Visit: Payer: BC Managed Care – PPO | Admitting: Nurse Practitioner

## 2021-08-31 ENCOUNTER — Other Ambulatory Visit: Payer: Self-pay

## 2021-08-31 VITALS — BP 118/80 | HR 85 | Temp 97.9°F | Ht 68.0 in | Wt 209.0 lb

## 2021-08-31 DIAGNOSIS — E1169 Type 2 diabetes mellitus with other specified complication: Secondary | ICD-10-CM

## 2021-08-31 DIAGNOSIS — G4733 Obstructive sleep apnea (adult) (pediatric): Secondary | ICD-10-CM

## 2021-08-31 DIAGNOSIS — Z6831 Body mass index (BMI) 31.0-31.9, adult: Secondary | ICD-10-CM

## 2021-08-31 DIAGNOSIS — J309 Allergic rhinitis, unspecified: Secondary | ICD-10-CM | POA: Diagnosis not present

## 2021-08-31 DIAGNOSIS — E661 Drug-induced obesity: Secondary | ICD-10-CM

## 2021-08-31 DIAGNOSIS — E785 Hyperlipidemia, unspecified: Secondary | ICD-10-CM

## 2021-08-31 DIAGNOSIS — I10 Essential (primary) hypertension: Secondary | ICD-10-CM

## 2021-08-31 NOTE — Patient Instructions (Signed)
Due for Prevnar 20 pneumonia vaccine.  ?

## 2021-08-31 NOTE — Progress Notes (Signed)
Careteam: Patient Care Team: Lauree Chandler, NP as PCP - General (Geriatric Medicine) Jettie Booze, MD as PCP - Cardiology (Cardiology) Lavonna Monarch, MD as Consulting Physician (Dermatology) Leandrew Koyanagi, MD as Attending Physician (Orthopedic Surgery) Roel Cluck, MD as Referring Physician (Ophthalmology) Deneise Lever, MD as Consulting Physician (Pulmonary Disease) Sheffield, Ronalee Red, PA-C as Physician Assistant (Dermatology)  PLACE OF SERVICE:  [place] Directive information Does Patient Have a Medical Advance Directive?: No, Would patient like information on creating a medical advance directive?: Yes (MAU/Ambulatory/Procedural Areas - Information given)  Allergies  Allergen Reactions   Fish Allergy Swelling    Tuna fish--- Swelling of lips     Chief Complaint  Patient presents with   Medical Management of Chronic Issues    4 month follow-up and discuss labs (patient viewed via mychart). Foot exam today. Discuss need for additional covid boosters and pneumonia vaccine or post pone if patient refuses.Patient denies receiving any vaccines since last visit. Bilateral ear pressure x 1 month.       HPI: Patient is a 66 y.o. male for routine follow up.   DM- reports too much snacking, too many carbohydrates. Not watching himself after the holidays. Feels like he can bring thing down with just diet. A1c over goal now at 7.3, trulicity, metformin and jardiance.   Hyperlipidemia- recent increase risk from coronary CT, increase lipitor.   Not having routine exercise during the colder month.   Ears are more clogged with pollen - using xyzal and flonase occasional. Using saline spray twice daily   OSA- on cpap Review of Systems:  Review of Systems  Constitutional:  Negative for chills, fever and weight loss.  HENT:  Positive for congestion. Negative for tinnitus.   Respiratory:  Negative for cough, sputum production and shortness of  breath.   Cardiovascular:  Negative for chest pain, palpitations and leg swelling.  Gastrointestinal:  Negative for abdominal pain, constipation, diarrhea and heartburn.  Genitourinary:  Negative for dysuria, frequency and urgency.  Musculoskeletal:  Negative for back pain, falls, joint pain and myalgias.  Skin: Negative.   Neurological:  Negative for dizziness and headaches.  Psychiatric/Behavioral:  Negative for depression and memory loss. The patient does not have insomnia.    Past Medical History:  Diagnosis Date   Allergy    Cancer ([place])    skin cancer- squamous cell    Diabetes mellitus without complication ([place])    Type 2   H/O lymph node excision 1998   [place] , [place]   Hypertension    Sleep apnea    wears cpap    Squamous cell carcinoma of skin 12/31/2014   RIGHT FOREARM TX = CX3 5FU   Viral pneumonia 1965   Past Surgical History:  Procedure Laterality Date   COLONOSCOPY     LYMPH NODE DISSECTION  1998   perirectal abcess  1989   POLYPECTOMY     SKIN BIOPSY Right 05/21/15   forearm Robyne Askew PA   WISDOM TOOTH EXTRACTION     Social History:   reports that he has never smoked. He has never used smokeless tobacco. He reports current alcohol use of about 1.0 standard drink per week. He reports that he does not use drugs.  Family History  Problem Relation Age of Onset   Hypertension Mother    Diabetes Mother 61       Diabetes type 2   Heart disease Father 47  Heart Attack   Colon cancer Neg Hx    Colon polyps Neg Hx    Esophageal cancer Neg Hx    Rectal cancer Neg Hx    Stomach cancer Neg Hx     Medications: Patient's Medications  New Prescriptions   No medications on file  Previous Medications   ASPIRIN 81 MG TABLET    Take 81 mg by mouth daily.   ATORVASTATIN (LIPITOR) 40 MG TABLET    Take 1 tablet (40 mg total) by mouth daily.   BENAZEPRIL (LOTENSIN) 40 MG TABLET    Take 1 tablet (40 mg total) by mouth daily.   CLOBETASOL (TEMOVATE) 0.05 %  EXTERNAL SOLUTION    Apply 1 application topically 2 (two) times daily.   DEXTROMETHORPHAN-GUAIFENESIN (MUCINEX DM MAXIMUM STRENGTH PO)    Take by mouth 2 (two) times daily after a meal.   DULAGLUTIDE (TRULICITY) 9.32 TF/5.7DU SOPN    INJECT 1 PEN SUBCUTANEOUSLY SUBCUTANEOUSLY ONCE A WEEK   FLUOROURACIL (EFUDEX) 5 % CREAM    Apply to affected area qd for 2 weeks   FLUTICASONE (FLONASE) 50 MCG/ACT NASAL SPRAY    Use 2 spray(s) in each nostril once daily   HYDROCHLOROTHIAZIDE (MICROZIDE) 12.5 MG CAPSULE    Take 1 capsule by mouth once daily   LEVOCETIRIZINE (XYZAL) 5 MG TABLET    Take 5 mg by mouth every evening.   METFORMIN (GLUCOPHAGE) 1000 MG TABLET    TAKE 1 TABLET BY MOUTH TWICE DAILY WITH A MEAL   MINOCYCLINE (MINOCIN) 100 MG CAPSULE    Take 1 capsule (100 mg total) by mouth daily.   PSYLLIUM (REGULOID) 0.52 G CAPSULE    Take 0.52 g by mouth daily.   SITAGLIPTIN (JANUVIA) 100 MG TABLET    Take 1 tablet (100 mg total) by mouth daily. With largest meal  Modified Medications   No medications on file  Discontinued Medications   MINOCYCLINE (MINOCIN) 100 MG CAPSULE    Take 100 mg by mouth daily.    Physical Exam:  Vitals:   08/31/21 0834  BP: 118/80  Pulse: 85  Temp: 97.9 F (36.6 C)  TempSrc: Temporal  SpO2: 96%  Weight: 209 lb (94.8 kg)  Height: '5\' 8"'$  (1.727 m)   Body mass index is 31.78 kg/m. Wt Readings from Last 3 Encounters:  08/31/21 209 lb (94.8 kg)  05/26/21 215 lb 6.4 oz (97.7 kg)  04/17/21 213 lb 12.8 oz (97 kg)    Physical Exam Constitutional:      General: He is not in acute distress.    Appearance: He is well-developed. He is not diaphoretic.  HENT:     Head: Normocephalic and atraumatic.     Right Ear: Tympanic membrane, ear canal and external ear normal.     Left Ear: Tympanic membrane, ear canal and external ear normal.     Mouth/Throat:     Pharynx: No oropharyngeal exudate.  Eyes:     Conjunctiva/sclera: Conjunctivae normal.     Pupils: Pupils are  equal, round, and reactive to light.  Cardiovascular:     Rate and Rhythm: Normal rate and regular rhythm.     Heart sounds: Normal heart sounds.  Pulmonary:     Effort: Pulmonary effort is normal.     Breath sounds: Normal breath sounds.  Abdominal:     General: Bowel sounds are normal.     Palpations: Abdomen is soft.  Musculoskeletal:        General: No tenderness.  Cervical back: Normal range of motion and neck supple.     Right lower leg: No edema.     Left lower leg: No edema.  Skin:    General: Skin is warm and dry.  Neurological:     Mental Status: He is alert and oriented to person, place, and time. Mental status is at baseline.    Labs reviewed: Basic Metabolic Panel: Recent Labs    12/09/20 0835 04/13/21 0816 08/21/21 0816  NA 140 140 141  K 3.6 3.8 4.4  CL 105 104 104  CO2 '23 27 28  '$ GLUCOSE 128* 123* 116*  BUN '18 15 18  '$ CREATININE 0.84 0.88 0.97  CALCIUM 9.0 8.9 9.2   Liver Function Tests: Recent Labs    12/09/20 0835 04/13/21 0816 08/21/21 0816  AST '17 21 16  '$ ALT '14 20 16  '$ BILITOT 1.1 1.4* 1.4*  PROT 6.4 6.2 6.2   No results for input(s): LIPASE, AMYLASE in the last 8760 hours. No results for input(s): AMMONIA in the last 8760 hours. CBC: Recent Labs    12/09/20 0835 04/13/21 0816  WBC 5.9 4.6  NEUTROABS 3,735 3,105  HGB 15.0 14.8  HCT 44.5 43.6  MCV 89.2 90.6  PLT 258 248   Lipid Panel: Recent Labs    12/09/20 0835 04/13/21 0816  CHOL 116 108  HDL 40 45  LDLCALC 56 47  TRIG 116 75  CHOLHDL 2.9 2.4   TSH: No results for input(s): TSH in the last 8760 hours. A1C: Lab Results  Component Value Date   HGBA1C 7.3 (H) 08/21/2021     Assessment/Plan 1. Essential hypertension Blood pressure well controlled Continue current medications Recheck metabolic panel   2. Type 2 diabetes mellitus with other specified complication, without long-term current use of insulin (HCC) -he has not been eating well or exercising, willing  to change both vs changing medication at this time. -will have make lifestyle changes and follow up A1c in 3 months.  -Encouraged dietary compliance, routine foot care/monitoring and to keep up with diabetic eye exams through ophthalmology -continue medication as prescribed. - Hemoglobin A1c; Future  3. Mild obstructive sleep apnea Cont on cpap  4. Allergic sinusitis -continues with nasal rinse twice daily, xyzal routinely and flonase PRN  5. Hyperlipidemia associated with type 2 diabetes mellitus ([place]) -recent increase in statin due to increase risk on coronary CT, continue dietary modifications.   6. Class 1 drug-induced obesity with serious comorbidity and body mass index (BMI) of 31.0 to 31.9 in adult --education provided on healthy weight loss through increase in physical activity and proper nutrition   Return in about 3 months (around 12/01/2021) for diabetes follow up- labs prior . Carlos American. [place], [facility] [phone]

## 2021-09-08 ENCOUNTER — Ambulatory Visit: Payer: BC Managed Care – PPO | Admitting: Physician Assistant

## 2021-09-24 ENCOUNTER — Other Ambulatory Visit: Payer: BC Managed Care – PPO

## 2021-09-28 ENCOUNTER — Other Ambulatory Visit: Payer: Self-pay | Admitting: Interventional Cardiology

## 2021-09-28 ENCOUNTER — Other Ambulatory Visit: Payer: BC Managed Care – PPO | Admitting: *Deleted

## 2021-09-28 DIAGNOSIS — E782 Mixed hyperlipidemia: Secondary | ICD-10-CM

## 2021-09-28 DIAGNOSIS — Z79899 Other long term (current) drug therapy: Secondary | ICD-10-CM

## 2021-09-28 DIAGNOSIS — E1159 Type 2 diabetes mellitus with other circulatory complications: Secondary | ICD-10-CM | POA: Diagnosis not present

## 2021-09-28 NOTE — Progress Notes (Signed)
Per keith in lab, pt has appointment on schedule, but no orders in lab. Requesting order for lipid and liver be input so he can perform bloodwork. ?

## 2021-10-02 LAB — LIPID PANEL
Chol/HDL Ratio: 2.4 ratio
Cholesterol, Total: 111 mg/dL
HDL: 46 mg/dL (ref >39)
LDL Chol Calc (NIH): 51 mg/dL
Triglycerides: 65 mg/dL
VLDL Cholesterol Cal: 14 mg/dL (ref 5–40)

## 2021-10-02 LAB — HEPATIC FUNCTION PANEL
ALT: 18 IU/L
AST: 19 IU/L (ref 0–40)
Albumin: 4.2 g/dL (ref 3.8–4.8)
Alkaline Phosphatase: 66 IU/L (ref 44–121)
Bilirubin Total: 1.2 mg/dL (ref 0.0–1.2)
Bilirubin, Direct: 0.3 mg/dL (ref 0.00–0.40)
Total Protein: 6.1 g/dL (ref 6.0–8.5)

## 2021-10-21 ENCOUNTER — Encounter: Payer: Self-pay | Admitting: Physician Assistant

## 2021-10-21 ENCOUNTER — Ambulatory Visit: Payer: BC Managed Care – PPO | Admitting: Physician Assistant

## 2021-10-21 DIAGNOSIS — R21 Rash and other nonspecific skin eruption: Secondary | ICD-10-CM | POA: Diagnosis not present

## 2021-10-21 DIAGNOSIS — L570 Actinic keratosis: Secondary | ICD-10-CM

## 2021-10-21 DIAGNOSIS — L309 Dermatitis, unspecified: Secondary | ICD-10-CM | POA: Diagnosis not present

## 2021-10-21 MED ORDER — CLOBETASOL PROPIONATE 0.05 % EX SOLN: 1.0000 "application " | Freq: Two times a day (BID) | CUTANEOUS | 6 refills | Status: AC

## 2021-10-21 MED ORDER — KETOCONAZOLE 2 % EX SHAM: MEDICATED_SHAMPOO | CUTANEOUS | 5 refills | Status: AC

## 2021-10-25 ENCOUNTER — Other Ambulatory Visit: Payer: Self-pay | Admitting: Nurse Practitioner

## 2021-10-25 DIAGNOSIS — E1169 Type 2 diabetes mellitus with other specified complication: Secondary | ICD-10-CM

## 2021-10-26 NOTE — Telephone Encounter (Signed)
Patient has request refill on medication "Trulicity". Medication last refilled 05/11/2021. Patient medication has Warnings. Medication pend and sent to PCP Dewaine Oats Carlos American, NP for approval.  ?

## 2021-10-28 ENCOUNTER — Encounter: Payer: Self-pay | Admitting: Physician Assistant

## 2021-10-28 NOTE — Progress Notes (Signed)
? ?  Follow-Up Visit ?  ?Subjective  ?[name] is a 66 y.o. male who presents for the following: Follow-up (Tolak follow up positive reaction). His scalp is scaly and needs a refill on his prescription shampoo.  ? ? ?The following portions of the chart were reviewed this encounter and updated as appropriate:  Tobacco  Allergies  Meds  Problems  Med Hx  Surg Hx  Fam Hx   ?  ? ?Objective  ?Well appearing patient in no apparent distress; mood and affect are within normal limits. ? ?A focused examination was performed including face, scalp arms and legs. Relevant physical exam findings are noted in the Assessment and Plan. ? ?Head - Anterior (Face) (3), Scalp ?Erythematous patches with gritty scale. ? ?Mid Frontal Scalp ?Diffuse scale ? ? ?Assessment & Plan  ?AK (actinic keratosis) (4) ?Head - Anterior (Face) (3); Scalp ? ?Destruction of lesion - Head - Anterior (Face), Scalp ?Complexity: simple   ?Destruction method: cryotherapy   ?Informed consent: discussed and consent obtained   ?Timeout:  patient name, date of birth, surgical site, and procedure verified ?Lesion destroyed using liquid nitrogen: Yes   ?Cryotherapy cycles:  1 ?Outcome: patient tolerated procedure well with no complications   ?Post-procedure details: wound care instructions given   ? ?Related Medications ?fluorouracil (EFUDEX) 5 % cream ?Apply to affected area qd for 2 weeks ? ?Rash and other nonspecific skin eruption ? ?Dermatitis ?Mid Frontal Scalp ? ?ketoconazole (NIZORAL) 2 % shampoo - Mid Frontal Scalp ?Apply to scalp and let sit 3-5 minutes then rinse. ? ?Related Medications ?clobetasol (TEMOVATE) 0.05 % external solution ?Apply 1 application. topically 2 (two) times daily. ? ? ? ?I, Tres Grzywacz, PA-C, have reviewed all documentation's for this visit.  The documentation on 10/28/21 for the exam, diagnosis, procedures and orders are all accurate and complete. ?

## 2021-11-27 ENCOUNTER — Other Ambulatory Visit: Payer: Self-pay | Admitting: Nurse Practitioner

## 2021-11-27 DIAGNOSIS — E1169 Type 2 diabetes mellitus with other specified complication: Secondary | ICD-10-CM

## 2021-11-30 ENCOUNTER — Other Ambulatory Visit: Payer: BC Managed Care – PPO

## 2021-11-30 DIAGNOSIS — E1169 Type 2 diabetes mellitus with other specified complication: Secondary | ICD-10-CM | POA: Diagnosis not present

## 2021-12-01 LAB — HEMOGLOBIN A1C
Hgb A1c MFr Bld: 6.9 % of total Hgb — ABNORMAL HIGH (ref <5.7)
Mean Plasma Glucose: 151 mg/dL
eAG (mmol/L): 8.4 mmol/L

## 2021-12-04 ENCOUNTER — Encounter: Payer: Self-pay | Admitting: Nurse Practitioner

## 2021-12-04 ENCOUNTER — Ambulatory Visit (INDEPENDENT_AMBULATORY_CARE_PROVIDER_SITE_OTHER): Payer: BC Managed Care – PPO | Admitting: Nurse Practitioner

## 2021-12-04 VITALS — BP 110/80 | HR 94 | Temp 97.5°F | Resp 18 | Ht 68.0 in | Wt 212.2 lb

## 2021-12-04 DIAGNOSIS — I10 Essential (primary) hypertension: Secondary | ICD-10-CM

## 2021-12-04 DIAGNOSIS — Z6832 Body mass index (BMI) 32.0-32.9, adult: Secondary | ICD-10-CM

## 2021-12-04 DIAGNOSIS — J309 Allergic rhinitis, unspecified: Secondary | ICD-10-CM

## 2021-12-04 DIAGNOSIS — E6609 Other obesity due to excess calories: Secondary | ICD-10-CM

## 2021-12-04 DIAGNOSIS — Z23 Encounter for immunization: Secondary | ICD-10-CM

## 2021-12-04 DIAGNOSIS — E1169 Type 2 diabetes mellitus with other specified complication: Secondary | ICD-10-CM

## 2021-12-04 NOTE — Patient Instructions (Signed)
Please contact your local pharmacy, previous provider, or insurance carrier for vaccine/immunization records. Ensure that any procedures done outside of [facility] are faxed to us [phone] or you can sign release of records form at the front desk to keep your medical record updated.   ?

## 2021-12-04 NOTE — Progress Notes (Signed)
Careteam: Patient Care Team: Lauree Chandler, NP as PCP - General (Geriatric Medicine) Jettie Booze, MD as PCP - Cardiology (Cardiology) Lavonna Monarch, MD as Consulting Physician (Dermatology) Leandrew Koyanagi, MD as Attending Physician (Orthopedic Surgery) Roel Cluck, MD as Referring Physician (Ophthalmology) Deneise Lever, MD as Consulting Physician (Pulmonary Disease) Sheffield, Ronalee Red, PA-C as Physician Assistant (Dermatology)  PLACE OF SERVICE:  [place] Directive information Does Patient Have a Medical Advance Directive?: No, Would patient like information on creating a medical advance directive?: No - Patient declined  Allergies  Allergen Reactions   Fish Allergy Swelling    Tuna fish--- Swelling of lips     Chief Complaint  Patient presents with   Medical Management of Chronic Issues    6 month follow up.   Immunizations    Discuss the need for Covid Booster, and Pne vaccine.    Concern     Patient has concerns of fluid in his ear. States wife also has pink eye.     HPI: Patient is a 66 y.o. male for routine follow up.   DM- he is taking trulicity, [place] and metformin. A1c was up but now he has increase in physical acitivty and trying to cut out snacky carbs.   Using xyzal for allergies. Eyes itch occasionally.  Ears feel itchy   Review of Systems:  Review of Systems  Constitutional:  Negative for chills, fever and weight loss.  HENT:  Positive for congestion. Negative for sinus pain and tinnitus.   Respiratory:  Negative for cough, sputum production and shortness of breath.   Cardiovascular:  Negative for chest pain, palpitations and leg swelling.  Gastrointestinal:  Negative for abdominal pain, constipation, diarrhea and heartburn.  Genitourinary:  Negative for dysuria, frequency and urgency.  Musculoskeletal:  Negative for back pain, falls, joint pain and myalgias.  Skin: Negative.   Neurological:  Negative for  dizziness and headaches.  Psychiatric/Behavioral:  Negative for depression and memory loss. The patient does not have insomnia.       Past Medical History:  Diagnosis Date   Allergy    Cancer ([place])    skin cancer- squamous cell    Diabetes mellitus without complication ([place])    Type 2   H/O lymph node excision 1998   [place] , [place]   Hypertension    Sleep apnea    wears cpap    Squamous cell carcinoma of skin 12/31/2014   RIGHT FOREARM TX = CX3 5FU   Viral pneumonia 1965   Past Surgical History:  Procedure Laterality Date   COLONOSCOPY     LYMPH NODE DISSECTION  1998   perirectal abcess  1989   POLYPECTOMY     SKIN BIOPSY Right 05/21/15   forearm Robyne Askew PA   WISDOM TOOTH EXTRACTION     Social History:   reports that he has never smoked. He has never used smokeless tobacco. He reports current alcohol use of about 1.0 standard drink of alcohol per week. He reports that he does not use drugs.  Family History  Problem Relation Age of Onset   Hypertension Mother    Diabetes Mother 28       Diabetes type 2   Heart disease Father 33       Heart Attack   Colon cancer Neg Hx    Colon polyps Neg Hx    Esophageal cancer Neg Hx    Rectal cancer Neg Hx  Stomach cancer Neg Hx     Medications: Patient's Medications  New Prescriptions   No medications on file  Previous Medications   ASPIRIN 81 MG TABLET    Take 81 mg by mouth daily.   ATORVASTATIN (LIPITOR) 40 MG TABLET    Take 1 tablet (40 mg total) by mouth daily.   BENAZEPRIL (LOTENSIN) 40 MG TABLET    Take 1 tablet (40 mg total) by mouth daily.   CLOBETASOL (TEMOVATE) 0.05 % EXTERNAL SOLUTION    Apply 1 application. topically 2 (two) times daily.   DEXTROMETHORPHAN-GUAIFENESIN (MUCINEX DM MAXIMUM STRENGTH PO)    Take by mouth 2 (two) times daily after a meal.   DULAGLUTIDE (TRULICITY) 9.32 TF/5.7DU SOPN    INJECT 1 PEN SUBCUTANEOUSLY ONCE A WEEK   FLUOROURACIL (EFUDEX) 5 % CREAM    Apply to affected area qd for  2 weeks   FLUTICASONE (FLONASE) 50 MCG/ACT NASAL SPRAY    Use 2 spray(s) in each nostril once daily   HYDROCHLOROTHIAZIDE (MICROZIDE) 12.5 MG CAPSULE    Take 1 capsule by mouth once daily   KETOCONAZOLE (NIZORAL) 2 % SHAMPOO    Apply to scalp and let sit 3-5 minutes then rinse.   LEVOCETIRIZINE (XYZAL) 5 MG TABLET    Take 5 mg by mouth every evening.   METFORMIN (GLUCOPHAGE) 1000 MG TABLET    TAKE 1 TABLET BY MOUTH TWICE DAILY WITH A MEAL   MINOCYCLINE (MINOCIN) 100 MG CAPSULE    Take 1 capsule (100 mg total) by mouth daily.   PSYLLIUM (REGULOID) 0.52 G CAPSULE    Take 0.52 g by mouth daily.   SITAGLIPTIN (JANUVIA) 100 MG TABLET    Take 1 tablet (100 mg total) by mouth daily. With largest meal  Modified Medications   No medications on file  Discontinued Medications   No medications on file    Physical Exam:  Vitals:   12/04/21 0908  BP: 110/80  Pulse: 94  Resp: 18  Temp: (!) 97.5 F (36.4 C)  SpO2: 94%  Weight: 212 lb 3.2 oz (96.3 kg)  Height: '5\' 8"'$  (1.727 m)   Body mass index is 32.26 kg/m. Wt Readings from Last 3 Encounters:  12/04/21 212 lb 3.2 oz (96.3 kg)  08/31/21 209 lb (94.8 kg)  05/26/21 215 lb 6.4 oz (97.7 kg)    Physical Exam Constitutional:      General: He is not in acute distress.    Appearance: He is well-developed. He is not diaphoretic.  HENT:     Head: Normocephalic and atraumatic.     Right Ear: Tympanic membrane, ear canal and external ear normal.     Left Ear: Tympanic membrane, ear canal and external ear normal.     Mouth/Throat:     Pharynx: No oropharyngeal exudate.  Eyes:     Conjunctiva/sclera: Conjunctivae normal.     Pupils: Pupils are equal, round, and reactive to light.  Cardiovascular:     Rate and Rhythm: Normal rate and regular rhythm.     Heart sounds: Normal heart sounds.  Pulmonary:     Effort: Pulmonary effort is normal.     Breath sounds: Normal breath sounds.  Abdominal:     General: Bowel sounds are normal.      Palpations: Abdomen is soft.  Musculoskeletal:        General: No tenderness.     Cervical back: Normal range of motion and neck supple.     Right lower leg: No edema.  Left lower leg: No edema.  Skin:    General: Skin is warm and dry.  Neurological:     Mental Status: He is alert and oriented to person, place, and time.     Labs reviewed: Basic Metabolic Panel: Recent Labs    12/09/20 0835 04/13/21 0816 08/21/21 0816  NA 140 140 141  K 3.6 3.8 4.4  CL 105 104 104  CO2 '23 27 28  '$ GLUCOSE 128* 123* 116*  BUN '18 15 18  '$ CREATININE 0.84 0.88 0.97  CALCIUM 9.0 8.9 9.2   Liver Function Tests: Recent Labs    04/13/21 0816 08/21/21 0816 09/28/21 0000  AST '21 16 19  '$ ALT '20 16 18  '$ ALKPHOS  --   --  66  BILITOT 1.4* 1.4* 1.2  PROT 6.2 6.2 6.1  ALBUMIN  --   --  4.2   No results for input(s): "LIPASE", "AMYLASE" in the last 8760 hours. No results for input(s): "AMMONIA" in the last 8760 hours. CBC: Recent Labs    12/09/20 0835 04/13/21 0816  WBC 5.9 4.6  NEUTROABS 3,735 3,105  HGB 15.0 14.8  HCT 44.5 43.6  MCV 89.2 90.6  PLT 258 248   Lipid Panel: Recent Labs    12/09/20 0835 04/13/21 0816 09/28/21 0000  CHOL 116 108 111  HDL 40 45 46  LDLCALC 56 47 51  TRIG 116 75 65  CHOLHDL 2.9 2.4 2.4   TSH: No results for input(s): "TSH" in the last 8760 hours. A1C: Lab Results  Component Value Date   HGBA1C 6.9 (H) 11/30/2021     Assessment/Plan 1. Type 2 diabetes mellitus with other specified complication, without long-term current use of insulin (HCC) Improved a1c with diet and increase in physical activities, he is continue to work on this.  -Encouraged dietary compliance, routine foot care/monitoring and to keep up with diabetic eye exams through ophthalmology  - Hemoglobin A1c; Future - Urine microalbumin-creatinine with uACR; Future  2. Essential hypertension -Blood pressure well controlled Continue current medications follow metabolic  panel  3. Need for pneumococcal 20-valent conjugate vaccination - Pneumococcal conjugate vaccine 20-valent (Prevnar 20)  4. Class 1 obesity due to excess calories with serious comorbidity and body mass index (BMI) of 32.0 to 32.9 in adult --education provided on healthy weight loss through increase in physical activity and proper nutrition   5. Allergic sinusitis -stable, continues on xyzal   Return in about 3 months (around 03/06/2022) for routine follow up- labs prior . Carlos American. [place], [facility] [phone]

## 2021-12-14 ENCOUNTER — Other Ambulatory Visit: Payer: Self-pay | Admitting: Nurse Practitioner

## 2021-12-14 DIAGNOSIS — I10 Essential (primary) hypertension: Secondary | ICD-10-CM

## 2022-01-02 ENCOUNTER — Other Ambulatory Visit: Payer: Self-pay | Admitting: Nurse Practitioner

## 2022-01-02 ENCOUNTER — Other Ambulatory Visit: Payer: Self-pay | Admitting: Interventional Cardiology

## 2022-01-02 DIAGNOSIS — E782 Mixed hyperlipidemia: Secondary | ICD-10-CM

## 2022-01-02 DIAGNOSIS — E1169 Type 2 diabetes mellitus with other specified complication: Secondary | ICD-10-CM

## 2022-01-04 NOTE — Telephone Encounter (Signed)
Patient has request refill on medication Januvia '100mg'$ . Patient medication last refilled 12/29/2020. Patient medication has High Risk Warnings. Medication pend and sent to PCP Dewaine Oats Carlos American, NP for approval.

## 2022-01-21 ENCOUNTER — Other Ambulatory Visit: Payer: Self-pay | Admitting: Physician Assistant

## 2022-01-21 DIAGNOSIS — L739 Follicular disorder, unspecified: Secondary | ICD-10-CM

## 2022-01-25 ENCOUNTER — Telehealth: Payer: Self-pay

## 2022-01-25 NOTE — Telephone Encounter (Signed)
Prior authorization request for Turlicity 0.'75mg'$ /0.40m pen injectors received from Cover my meds. Prior authorization started and waiting on response

## 2022-01-26 NOTE — Telephone Encounter (Signed)
Prior authorization approved 01/25/22

## 2022-02-06 ENCOUNTER — Other Ambulatory Visit: Payer: Self-pay | Admitting: Dermatology

## 2022-02-06 DIAGNOSIS — L739 Follicular disorder, unspecified: Secondary | ICD-10-CM

## 2022-03-04 ENCOUNTER — Other Ambulatory Visit: Payer: Self-pay

## 2022-03-04 DIAGNOSIS — E1169 Type 2 diabetes mellitus with other specified complication: Secondary | ICD-10-CM

## 2022-03-05 ENCOUNTER — Other Ambulatory Visit: Payer: BC Managed Care – PPO

## 2022-03-05 ENCOUNTER — Other Ambulatory Visit: Payer: BC Managed Care – PPO | Admitting: Nurse Practitioner

## 2022-03-05 DIAGNOSIS — E1169 Type 2 diabetes mellitus with other specified complication: Secondary | ICD-10-CM | POA: Diagnosis not present

## 2022-03-06 LAB — HEMOGLOBIN A1C
Hgb A1c MFr Bld: 7.1 % of total Hgb — ABNORMAL HIGH (ref <5.7)
Mean Plasma Glucose: 157 mg/dL
eAG (mmol/L): 8.7 mmol/L

## 2022-03-06 LAB — MICROALBUMIN / CREATININE URINE RATIO
Creatinine, Urine: 118 mg/dL (ref 20–320)
Microalb Creat Ratio: 3 mcg/mg creat (ref <30)
Microalb, Ur: 0.4 mg/dL

## 2022-03-08 ENCOUNTER — Ambulatory Visit: Payer: BC Managed Care – PPO | Admitting: Nurse Practitioner

## 2022-03-08 ENCOUNTER — Encounter: Payer: Self-pay | Admitting: Nurse Practitioner

## 2022-03-08 VITALS — BP 128/80 | HR 83 | Temp 97.3°F | Ht 68.0 in | Wt 211.8 lb

## 2022-03-08 DIAGNOSIS — Z9989 Dependence on other enabling machines and devices: Secondary | ICD-10-CM

## 2022-03-08 DIAGNOSIS — Z23 Encounter for immunization: Secondary | ICD-10-CM

## 2022-03-08 DIAGNOSIS — E6609 Other obesity due to excess calories: Secondary | ICD-10-CM

## 2022-03-08 DIAGNOSIS — E1169 Type 2 diabetes mellitus with other specified complication: Secondary | ICD-10-CM

## 2022-03-08 DIAGNOSIS — G4733 Obstructive sleep apnea (adult) (pediatric): Secondary | ICD-10-CM

## 2022-03-08 DIAGNOSIS — E785 Hyperlipidemia, unspecified: Secondary | ICD-10-CM

## 2022-03-08 DIAGNOSIS — Z85828 Personal history of other malignant neoplasm of skin: Secondary | ICD-10-CM

## 2022-03-08 DIAGNOSIS — L739 Follicular disorder, unspecified: Secondary | ICD-10-CM | POA: Diagnosis not present

## 2022-03-08 DIAGNOSIS — Z6832 Body mass index (BMI) 32.0-32.9, adult: Secondary | ICD-10-CM

## 2022-03-08 DIAGNOSIS — J309 Allergic rhinitis, unspecified: Secondary | ICD-10-CM

## 2022-03-08 DIAGNOSIS — I10 Essential (primary) hypertension: Secondary | ICD-10-CM | POA: Diagnosis not present

## 2022-03-08 MED ORDER — FLUTICASONE PROPIONATE 50 MCG/ACT NA SUSP: NASAL | 2 refills | Status: DC

## 2022-03-08 MED ORDER — MINOCYCLINE HCL 100 MG PO CAPS: 100.0000 mg | ORAL_CAPSULE | Freq: Every day | ORAL | 1 refills | Status: DC

## 2022-03-08 NOTE — Progress Notes (Signed)
Careteam: Patient Care Team: Sharon Seller, NP as PCP - General (Geriatric Medicine) Corky Crafts, MD as PCP - Cardiology (Cardiology) Janalyn Harder, MD (Inactive) as Consulting Physician (Dermatology) Tarry Kos, MD as Attending Physician (Orthopedic Surgery) Lovenia Shuck, MD as Referring Physician (Ophthalmology) Waymon Budge, MD as Consulting Physician (Pulmonary Disease) Sheffield, Judye Bos, PA-C as Physician Assistant (Dermatology)  PLACE OF SERVICE:  [facility]  Advanced Directive information    Allergies  Allergen Reactions   Fish Allergy Swelling    Tuna fish--- Swelling of lips     Chief Complaint  Patient presents with   Medical Management of Chronic Issues    Patient presents today for a 3 month f/u   Quality Metric Gaps    COVID#6     HPI: Patient is a 66 y.o. male for routine follow up. Doing well today. No acute concerns.   Doing well. Needs new dermatology.   Eating more snacky foods. No hypoglycemia.   Allergies have been more noticeable recently. Taking flonase and xyzal routinely  Needing a new dermatologist.   Review of Systems:  Review of Systems  Constitutional:  Negative for chills, fever and weight loss.  HENT:  Negative for tinnitus.   Respiratory:  Negative for cough, sputum production and shortness of breath.   Cardiovascular:  Negative for chest pain, palpitations and leg swelling.  Gastrointestinal:  Negative for abdominal pain, constipation, diarrhea and heartburn.  Genitourinary:  Negative for dysuria, frequency and urgency.  Musculoskeletal:  Negative for back pain, falls, joint pain and myalgias.  Skin: Negative.   Neurological:  Negative for dizziness and headaches.  Psychiatric/Behavioral:  Negative for depression and memory loss. The patient does not have insomnia.     Past Medical History:  Diagnosis Date   Allergy    Cancer (HCC)    skin cancer- squamous cell    Diabetes mellitus without  complication (HCC)    Type 2   H/O lymph node excision 1998   [place] , [place]   Hypertension    Sleep apnea    wears cpap    Squamous cell carcinoma of skin 12/31/2014   RIGHT FOREARM TX = CX3 5FU   Viral pneumonia 1965   Past Surgical History:  Procedure Laterality Date   COLONOSCOPY     LYMPH NODE DISSECTION  1998   perirectal abcess  1989   POLYPECTOMY     SKIN BIOPSY Right 05/21/15   forearm Mackey Birchwood PA   WISDOM TOOTH EXTRACTION     Social History:   reports that he has never smoked. He has never used smokeless tobacco. He reports current alcohol use of about 1.0 standard drink of alcohol per week. He reports that he does not use drugs.  Family History  Problem Relation Age of Onset   Hypertension Mother    Diabetes Mother 23       Diabetes type 2   Heart disease Father 73       Heart Attack   Colon cancer Neg Hx    Colon polyps Neg Hx    Esophageal cancer Neg Hx    Rectal cancer Neg Hx    Stomach cancer Neg Hx     Medications: Patient's Medications  New Prescriptions   No medications on file  Previous Medications   ASPIRIN 81 MG TABLET    Take 81 mg by mouth daily.   ATORVASTATIN (LIPITOR) 40 MG TABLET    Take 1 tablet by mouth  once daily   BENAZEPRIL (LOTENSIN) 40 MG TABLET    Take 1 tablet by mouth once daily   CLOBETASOL (TEMOVATE) 0.05 % EXTERNAL SOLUTION    Apply 1 application. topically 2 (two) times daily.   DEXTROMETHORPHAN-GUAIFENESIN (MUCINEX DM MAXIMUM STRENGTH PO)    Take by mouth 2 (two) times daily after a meal.   DULAGLUTIDE (TRULICITY) 5.78 IO/9.6EX SOPN    INJECT 1 PEN SUBCUTANEOUSLY ONCE A WEEK   FLUOROURACIL (EFUDEX) 5 % CREAM    Apply to affected area qd for 2 weeks   FLUTICASONE (FLONASE) 50 MCG/ACT NASAL SPRAY    Use 2 spray(s) in each nostril once daily   HYDROCHLOROTHIAZIDE (MICROZIDE) 12.5 MG CAPSULE    Take 1 capsule by mouth once daily   JANUVIA 100 MG TABLET    TAKE 1 TABLET BY MOUTH ONCE DAILY WITH  LARGEST  MEAL   KETOCONAZOLE  (NIZORAL) 2 % SHAMPOO    Apply to scalp and let sit 3-5 minutes then rinse.   LEVOCETIRIZINE (XYZAL) 5 MG TABLET    Take 5 mg by mouth every evening.   METFORMIN (GLUCOPHAGE) 1000 MG TABLET    TAKE 1 TABLET BY MOUTH TWICE DAILY WITH A MEAL   MINOCYCLINE (MINOCIN) 100 MG CAPSULE    Take 1 capsule by mouth once daily   PSYLLIUM (REGULOID) 0.52 G CAPSULE    Take 0.52 g by mouth daily.  Modified Medications   No medications on file  Discontinued Medications   No medications on file    Physical Exam:  Vitals:   03/08/22 0852  BP: 128/80  Pulse: 83  Temp: (!) 97.3 F (36.3 C)  SpO2: 97%  Weight: 211 lb 12.8 oz (96.1 kg)  Height: $Remove'5\' 8"'HTjxTAy$  (1.727 m)   Body mass index is 32.2 kg/m. Wt Readings from Last 3 Encounters:  03/08/22 211 lb 12.8 oz (96.1 kg)  12/04/21 212 lb 3.2 oz (96.3 kg)  08/31/21 209 lb (94.8 kg)    Physical Exam Constitutional:      General: He is not in acute distress.    Appearance: He is well-developed. He is not diaphoretic.  HENT:     Head: Normocephalic and atraumatic.     Right Ear: External ear normal.     Left Ear: External ear normal.     Mouth/Throat:     Pharynx: No oropharyngeal exudate.  Eyes:     Conjunctiva/sclera: Conjunctivae normal.     Pupils: Pupils are equal, round, and reactive to light.  Cardiovascular:     Rate and Rhythm: Normal rate and regular rhythm.     Heart sounds: Normal heart sounds.  Pulmonary:     Effort: Pulmonary effort is normal.     Breath sounds: Normal breath sounds.  Abdominal:     General: Bowel sounds are normal.     Palpations: Abdomen is soft.  Musculoskeletal:        General: No tenderness.     Cervical back: Normal range of motion and neck supple.     Right lower leg: No edema.     Left lower leg: No edema.  Skin:    General: Skin is warm and dry.  Neurological:     Mental Status: He is alert and oriented to person, place, and time.     Labs reviewed: Basic Metabolic Panel: Recent Labs     04/13/21 0816 08/21/21 0816  NA 140 141  K 3.8 4.4  CL 104 104  CO2 27 28  GLUCOSE  123* 116*  BUN 15 18  CREATININE 0.88 0.97  CALCIUM 8.9 9.2   Liver Function Tests: Recent Labs    04/13/21 0816 08/21/21 0816 09/28/21 0000  AST $Re'21 16 19  'oWp$ ALT $R'20 16 18  'SI$ ALKPHOS  --   --  66  BILITOT 1.4* 1.4* 1.2  PROT 6.2 6.2 6.1  ALBUMIN  --   --  4.2   No results for input(s): "LIPASE", "AMYLASE" in the last 8760 hours. No results for input(s): "AMMONIA" in the last 8760 hours. CBC: Recent Labs    04/13/21 0816  WBC 4.6  NEUTROABS 3,105  HGB 14.8  HCT 43.6  MCV 90.6  PLT 248   Lipid Panel: Recent Labs    04/13/21 0816 09/28/21 0000  CHOL 108 111  HDL 45 46  LDLCALC 47 51  TRIG 75 65  CHOLHDL 2.4 2.4   TSH: No results for input(s): "TSH" in the last 8760 hours. A1C: Lab Results  Component Value Date   HGBA1C 7.1 (H) 03/05/2022     Assessment/Plan 1. Need for influenza vaccination - Flu Vaccine QUAD High Dose(Fluad)  2. Hx of squamous cell carcinoma of skin - Ambulatory referral to Dermatology for ongoing skin assessments  3. Folliculitis -referral provided until he can get into dermatology for further evaluation  - minocycline (MINOCIN) 100 MG capsule; Take 1 capsule (100 mg total) by mouth daily.  Dispense: 30 capsule; Refill: 1  4. Type 2 diabetes mellitus with other specified complication, without long-term current use of insulin (HCC) -a1c over goal. Encouraged dietary compliance and increase in physical activity, routine foot care/monitoring and to keep up with diabetic eye exams through ophthalmology  -continue current medication regimen.  - Hemoglobin A1c; Future  5. Essential hypertension --Blood pressure elevated today but typically well controlled -home blood pressures are well controlled -No changes to medications today  -will have pt continue to monitor home bp goal <140/90, to notify if readings remain high on 3 different days  -follow  metabolic panel - CBC with Differential/Platelet; Future - CMP with eGFR([facility]); Future  6. Class 1 obesity due to excess calories with serious comorbidity and body mass index (BMI) of 32.0 to 32.9 in adult --education provided on healthy weight loss through increase in physical activity and proper nutrition   7. Hyperlipidemia associated with type 2 diabetes mellitus ([place]) -LDL at goal on last labs, continue current regimen.   8. OSA on CPAP -continues on CPAP  9. Allergic sinusitis Stable on flonase.  - fluticasone (FLONASE) 50 MCG/ACT nasal spray; Use 2 spray(s) in each nostril once daily  Dispense: 16 g; Refill: 2    Return in about 3 months (around 06/07/2022) for routine follow up, labs prior to visit . Carlos American. [place], [facility] [phone]

## 2022-03-08 NOTE — Patient Instructions (Addendum)
Increase physical actiivty  Limit carbohydrate/snacks and sugar    You received your high dose flu vaccine today

## 2022-03-29 ENCOUNTER — Other Ambulatory Visit: Payer: Self-pay | Admitting: Nurse Practitioner

## 2022-03-29 DIAGNOSIS — I10 Essential (primary) hypertension: Secondary | ICD-10-CM

## 2022-04-18 ENCOUNTER — Other Ambulatory Visit: Payer: Self-pay | Admitting: Nurse Practitioner

## 2022-04-18 DIAGNOSIS — E1169 Type 2 diabetes mellitus with other specified complication: Secondary | ICD-10-CM

## 2022-04-19 NOTE — Telephone Encounter (Signed)
Patient has request refill on medication Trulicity. Patient medication has warnings. Medication pend and sent to PCP Lauree Chandler, NP .

## 2022-04-22 ENCOUNTER — Telehealth: Payer: Self-pay | Admitting: Interventional Cardiology

## 2022-04-22 NOTE — Telephone Encounter (Signed)
error 

## 2022-04-27 ENCOUNTER — Ambulatory Visit: Payer: BC Managed Care – PPO | Admitting: Physician Assistant

## 2022-05-09 ENCOUNTER — Other Ambulatory Visit: Payer: Self-pay | Admitting: Nurse Practitioner

## 2022-05-09 DIAGNOSIS — E119 Type 2 diabetes mellitus without complications: Secondary | ICD-10-CM

## 2022-05-17 DIAGNOSIS — H5213 Myopia, bilateral: Secondary | ICD-10-CM | POA: Diagnosis not present

## 2022-05-17 DIAGNOSIS — Z7984 Long term (current) use of oral hypoglycemic drugs: Secondary | ICD-10-CM | POA: Diagnosis not present

## 2022-05-17 DIAGNOSIS — H43393 Other vitreous opacities, bilateral: Secondary | ICD-10-CM | POA: Diagnosis not present

## 2022-05-17 DIAGNOSIS — E119 Type 2 diabetes mellitus without complications: Secondary | ICD-10-CM | POA: Diagnosis not present

## 2022-05-17 DIAGNOSIS — H2513 Age-related nuclear cataract, bilateral: Secondary | ICD-10-CM | POA: Diagnosis not present

## 2022-05-17 DIAGNOSIS — H524 Presbyopia: Secondary | ICD-10-CM | POA: Diagnosis not present

## 2022-05-27 ENCOUNTER — Other Ambulatory Visit: Payer: Self-pay

## 2022-05-27 DIAGNOSIS — I10 Essential (primary) hypertension: Secondary | ICD-10-CM

## 2022-05-27 DIAGNOSIS — E1169 Type 2 diabetes mellitus with other specified complication: Secondary | ICD-10-CM

## 2022-05-29 ENCOUNTER — Other Ambulatory Visit: Payer: Self-pay | Admitting: Nurse Practitioner

## 2022-05-29 DIAGNOSIS — L739 Follicular disorder, unspecified: Secondary | ICD-10-CM

## 2022-05-31 ENCOUNTER — Other Ambulatory Visit: Payer: BC Managed Care – PPO

## 2022-05-31 DIAGNOSIS — I10 Essential (primary) hypertension: Secondary | ICD-10-CM | POA: Diagnosis not present

## 2022-05-31 DIAGNOSIS — E1169 Type 2 diabetes mellitus with other specified complication: Secondary | ICD-10-CM | POA: Diagnosis not present

## 2022-05-31 NOTE — Telephone Encounter (Signed)
Please advise if refill request is consider a long-term medication and if so, is it ok to give a year supply

## 2022-06-02 LAB — CBC WITH DIFFERENTIAL/PLATELET
Absolute Monocytes: 390 cells/uL (ref 200–950)
Basophils Absolute: 19 cells/uL (ref 0–200)
Basophils Relative: 0.4 %
Eosinophils Absolute: 108 cells/uL (ref 15–500)
Eosinophils Relative: 2.3 %
HCT: 43.9 % (ref 38.5–50.0)
Hemoglobin: 15.1 g/dL (ref 13.2–17.1)
Lymphs Abs: 1072 cells/uL (ref 850–3900)
MCH: 30.8 pg (ref 27.0–33.0)
MCHC: 34.4 g/dL (ref 32.0–36.0)
MCV: 89.4 fL (ref 80.0–100.0)
MPV: 9.4 fL (ref 7.5–12.5)
Monocytes Relative: 8.3 %
Neutro Abs: 3111 cells/uL (ref 1500–7800)
Neutrophils Relative %: 66.2 %
Platelets: 247 10*3/uL (ref 140–400)
RBC: 4.91 10*6/uL (ref 4.20–5.80)
RDW: 12.5 % (ref 11.0–15.0)
Total Lymphocyte: 22.8 %
WBC: 4.7 10*3/uL (ref 3.8–10.8)

## 2022-06-02 LAB — COMPLETE METABOLIC PANEL WITH GFR
AG Ratio: 1.9 (calc) (ref 1.0–2.5)
ALT: 23 U/L (ref 9–46)
AST: 19 U/L (ref 10–35)
Albumin: 4.2 g/dL (ref 3.6–5.1)
Alkaline phosphatase (APISO): 59 U/L (ref 35–144)
BUN: 13 mg/dL (ref 7–25)
CO2: 29 mmol/L (ref 20–32)
Calcium: 8.8 mg/dL (ref 8.6–10.3)
Chloride: 104 mmol/L (ref 98–110)
Creat: 0.9 mg/dL (ref 0.70–1.35)
Globulin: 2.2 g/dL (calc) (ref 1.9–3.7)
Glucose, Bld: 154 mg/dL — ABNORMAL HIGH (ref 65–99)
Potassium: 3.8 mmol/L (ref 3.5–5.3)
Sodium: 141 mmol/L (ref 135–146)
Total Bilirubin: 1.5 mg/dL — ABNORMAL HIGH (ref 0.2–1.2)
Total Protein: 6.4 g/dL (ref 6.1–8.1)
eGFR: 94 mL/min/{1.73_m2} (ref >=60)

## 2022-06-02 LAB — HEMOGLOBIN A1C
Hgb A1c MFr Bld: 8.4 % of total Hgb — ABNORMAL HIGH (ref <5.7)
Mean Plasma Glucose: 194 mg/dL
eAG (mmol/L): 10.8 mmol/L

## 2022-06-07 ENCOUNTER — Ambulatory Visit: Payer: BC Managed Care – PPO | Admitting: Nurse Practitioner

## 2022-06-07 ENCOUNTER — Encounter: Payer: Self-pay | Admitting: Nurse Practitioner

## 2022-06-07 VITALS — BP 124/82 | HR 73 | Temp 97.1°F | Ht 68.0 in | Wt 215.8 lb

## 2022-06-07 DIAGNOSIS — K5900 Constipation, unspecified: Secondary | ICD-10-CM | POA: Diagnosis not present

## 2022-06-07 DIAGNOSIS — E6609 Other obesity due to excess calories: Secondary | ICD-10-CM | POA: Diagnosis not present

## 2022-06-07 DIAGNOSIS — I10 Essential (primary) hypertension: Secondary | ICD-10-CM | POA: Diagnosis not present

## 2022-06-07 DIAGNOSIS — Z6832 Body mass index (BMI) 32.0-32.9, adult: Secondary | ICD-10-CM

## 2022-06-07 DIAGNOSIS — E1169 Type 2 diabetes mellitus with other specified complication: Secondary | ICD-10-CM | POA: Diagnosis not present

## 2022-06-07 DIAGNOSIS — E785 Hyperlipidemia, unspecified: Secondary | ICD-10-CM

## 2022-06-07 MED ORDER — TRULICITY 1.5 MG/0.5ML ~~LOC~~ SOAJ: 1.5000 mg | SUBCUTANEOUS | 1 refills | Status: DC

## 2022-06-07 NOTE — Progress Notes (Signed)
Careteam: Patient Care Team: Lauree Chandler, NP as PCP - General (Geriatric Medicine) Jettie Booze, MD as PCP - Cardiology (Cardiology) Lavonna Monarch, MD (Inactive) as Consulting Physician (Dermatology) Leandrew Koyanagi, MD as Attending Physician (Orthopedic Surgery) Roel Cluck, MD as Referring Physician (Ophthalmology) Deneise Lever, MD as Consulting Physician (Pulmonary Disease) Willette Pa Ronalee Red, PA-C as Physician Assistant (Dermatology)  PLACE OF SERVICE:  [place]  Advanced Directive information    Allergies  Allergen Reactions   Fish Allergy Swelling    Tuna fish--- Swelling of lips     Chief Complaint  Patient presents with   Medical Management of Chronic Issues    3 month follow-up. Discuss need for covid boosters, colonoscopy, and eye exam or post pone if patient refuses. Requesting to have ears examined for wax build up. Patient also c/o abnormal bowels, cramping, unsettled stomach, and gas, possible related to metformin x several months.      HPI: Patient is a 66 y.o. male for routine follow up.   He has not been eating right- eating too many sweets.  He has 6 weeks of Trulicity  He is doing some walking- more irregular as the weather gets cold.   Has constipation and then diarrhea.    Review of Systems:  Review of Systems  Constitutional:  Negative for chills, fever and weight loss.  HENT:  Negative for tinnitus.   Respiratory:  Negative for cough, sputum production and shortness of breath.   Cardiovascular:  Negative for chest pain, palpitations and leg swelling.  Gastrointestinal:  Negative for abdominal pain, constipation, diarrhea and heartburn.  Genitourinary:  Negative for dysuria, frequency and urgency.  Musculoskeletal:  Negative for back pain, falls, joint pain and myalgias.  Skin: Negative.   Neurological:  Negative for dizziness and headaches.  Psychiatric/Behavioral:  Negative for depression and memory loss. The  patient does not have insomnia.    Past Medical History:  Diagnosis Date   Allergy    Cancer ([place])    skin cancer- squamous cell    Diabetes mellitus without complication ([place])    Type 2   H/O lymph node excision 1998   [place] , [place]   Hypertension    Sleep apnea    wears cpap    Squamous cell carcinoma of skin 12/31/2014   RIGHT FOREARM TX = CX3 5FU   Viral pneumonia 1965   Past Surgical History:  Procedure Laterality Date   COLONOSCOPY     LYMPH NODE DISSECTION  1998   perirectal abcess  1989   POLYPECTOMY     SKIN BIOPSY Right 05/21/15   forearm Robyne Askew PA   WISDOM TOOTH EXTRACTION     Social History:   reports that he has never smoked. He has never used smokeless tobacco. He reports current alcohol use of about 1.0 standard drink of alcohol per week. He reports that he does not use drugs.  Family History  Problem Relation Age of Onset   Hypertension Mother    Diabetes Mother 56       Diabetes type 2   Heart disease Father 70       Heart Attack   Colon cancer Neg Hx    Colon polyps Neg Hx    Esophageal cancer Neg Hx    Rectal cancer Neg Hx    Stomach cancer Neg Hx     Medications: Patient's Medications  New Prescriptions   No medications on file  Previous Medications  ASPIRIN 81 MG TABLET    Take 81 mg by mouth daily.   ATORVASTATIN (LIPITOR) 40 MG TABLET    Take 1 tablet by mouth once daily   BENAZEPRIL (LOTENSIN) 40 MG TABLET    Take 1 tablet by mouth once daily   CLOBETASOL (TEMOVATE) 0.05 % EXTERNAL SOLUTION    Apply 1 application. topically 2 (two) times daily.   DULAGLUTIDE (TRULICITY) 5.17 GY/1.7CB SOPN    INJECT 1 SYRINGE SUBCUTANEOUSLY ONCE A WEEK   FLUOROURACIL (EFUDEX) 5 % CREAM    Apply to affected area qd for 2 weeks   FLUTICASONE (FLONASE) 50 MCG/ACT NASAL SPRAY    Use 2 spray(s) in each nostril once daily   GUAIFENESIN (MUCINEX) 600 MG 12 HR TABLET    Take 1,200 mg by mouth 2 (two) times daily.   HYDROCHLOROTHIAZIDE (MICROZIDE) 12.5  MG CAPSULE    Take 1 capsule by mouth once daily   JANUVIA 100 MG TABLET    TAKE 1 TABLET BY MOUTH ONCE DAILY WITH  LARGEST  MEAL   KETOCONAZOLE (NIZORAL) 2 % SHAMPOO    Apply to scalp and let sit 3-5 minutes then rinse.   LEVOCETIRIZINE (XYZAL) 5 MG TABLET    Take 5 mg by mouth every evening.   METFORMIN (GLUCOPHAGE) 1000 MG TABLET    TAKE 1 TABLET BY MOUTH TWICE DAILY WITH A MEAL   MINOCYCLINE (MINOCIN) 100 MG CAPSULE    Take 1 capsule by mouth once daily   PSYLLIUM (REGULOID) 0.52 G CAPSULE    Take 0.52 g by mouth daily.  Modified Medications   No medications on file  Discontinued Medications   DEXTROMETHORPHAN-GUAIFENESIN (MUCINEX DM MAXIMUM STRENGTH PO)    Take by mouth 2 (two) times daily after a meal.    Physical Exam:  Vitals:   06/07/22 0856  BP: 124/82  Pulse: 73  Temp: (!) 97.1 F (36.2 C)  TempSrc: Temporal  SpO2: 96%  Weight: 215 lb 12.8 oz (97.9 kg)  Height: '5\' 8"'$  (1.727 m)   Body mass index is 32.81 kg/m. Wt Readings from Last 3 Encounters:  06/07/22 215 lb 12.8 oz (97.9 kg)  03/08/22 211 lb 12.8 oz (96.1 kg)  12/04/21 212 lb 3.2 oz (96.3 kg)    Physical Exam Constitutional:      General: He is not in acute distress.    Appearance: He is well-developed. He is not diaphoretic.  HENT:     Head: Normocephalic and atraumatic.     Right Ear: External ear normal.     Left Ear: External ear normal.     Mouth/Throat:     Pharynx: No oropharyngeal exudate.  Eyes:     Conjunctiva/sclera: Conjunctivae normal.     Pupils: Pupils are equal, round, and reactive to light.  Cardiovascular:     Rate and Rhythm: Normal rate and regular rhythm.     Heart sounds: Normal heart sounds.  Pulmonary:     Effort: Pulmonary effort is normal.     Breath sounds: Normal breath sounds.  Abdominal:     General: Bowel sounds are normal.     Palpations: Abdomen is soft.  Musculoskeletal:        General: No tenderness.     Cervical back: Normal range of motion and neck supple.      Right lower leg: No edema.     Left lower leg: No edema.  Skin:    General: Skin is warm and dry.  Neurological:  Mental Status: He is alert and oriented to person, place, and time.     Labs reviewed: Basic Metabolic Panel: Recent Labs    08/21/21 0816 05/31/22 0817  NA 141 141  K 4.4 3.8  CL 104 104  CO2 28 29  GLUCOSE 116* 154*  BUN 18 13  CREATININE 0.97 0.90  CALCIUM 9.2 8.8   Liver Function Tests: Recent Labs    08/21/21 0816 09/28/21 0000 05/31/22 0817  AST '16 19 19  '$ ALT '16 18 23  '$ ALKPHOS  --  66  --   BILITOT 1.4* 1.2 1.5*  PROT 6.2 6.1 6.4  ALBUMIN  --  4.2  --    No results for input(s): "LIPASE", "AMYLASE" in the last 8760 hours. No results for input(s): "AMMONIA" in the last 8760 hours. CBC: Recent Labs    05/31/22 0817  WBC 4.7  NEUTROABS 3,111  HGB 15.1  HCT 43.9  MCV 89.4  PLT 247   Lipid Panel: Recent Labs    09/28/21 0000  CHOL 111  HDL 46  LDLCALC 51  TRIG 65  CHOLHDL 2.4   TSH: No results for input(s): "TSH" in the last 8760 hours. A1C: Lab Results  Component Value Date   HGBA1C 8.4 (H) 05/31/2022     Assessment/Plan 1. Type 2 diabetes mellitus with other specified complication, without long-term current use of insulin (HCC) Not at goal. Will increase Trulicity to 1.5 mg weekly and have patient work on dietary modifications.  - discussed with the patient the pathophysiology of diabetes and the natural progression of the disease.  -stressed the importance of lifestyle changes including diet and exercise. -discussed complications associated with diabetes including retinopathy, nephropathy, neuropathy as well as increased risk of cardiovascular disease. We went over the benefit seen with glycemic control.   - Dulaglutide (TRULICITY) 1.5 WO/0.3OZ SOPN; Inject 1.5 mg into the skin once a week.  Dispense: 3 mL; Refill: 1 - Hemoglobin A1c; Future  2. Essential hypertension -Blood pressure well controlled, goal bp  <140/90 Continue current medications and dietary modifications follow metabolic panel - COMPLETE METABOLIC PANEL WITH GFR; Future - CBC with Differential/Platelet; Future  3. Class 1 obesity due to excess calories with serious comorbidity and body mass index (BMI) of 32.0 to 32.9 in adult --education provided on healthy weight loss through increase in physical activity and proper nutrition   4. Hyperlipidemia associated with type 2 diabetes mellitus ([place]) -continues on lipitor and discussed dietary modifications.  - Lipid panel; Future  5. Constipation -continue on fiber but increase fluids Also to add miralax daily- can titrate if needed  Return in about 3 months (around 09/06/2022) for routine follow up, labs prior . Carlos American. [place], [facility] [phone]

## 2022-06-07 NOTE — Patient Instructions (Signed)
Increase water.   To use miralax- half dose or full daily- to keep bowel moving.   Stops sweets.   Increase Trulicity to 1.5 mg weekly- can take 2 of the shots at the same time before you get new prescriptions

## 2022-06-27 ENCOUNTER — Other Ambulatory Visit: Payer: Self-pay | Admitting: Nurse Practitioner

## 2022-06-27 DIAGNOSIS — L739 Follicular disorder, unspecified: Secondary | ICD-10-CM

## 2022-07-03 ENCOUNTER — Other Ambulatory Visit: Payer: Self-pay | Admitting: Nurse Practitioner

## 2022-07-03 DIAGNOSIS — J309 Allergic rhinitis, unspecified: Secondary | ICD-10-CM

## 2022-07-05 DIAGNOSIS — L814 Other melanin hyperpigmentation: Secondary | ICD-10-CM | POA: Diagnosis not present

## 2022-07-05 DIAGNOSIS — C4441 Basal cell carcinoma of skin of scalp and neck: Secondary | ICD-10-CM | POA: Diagnosis not present

## 2022-07-05 DIAGNOSIS — D225 Melanocytic nevi of trunk: Secondary | ICD-10-CM | POA: Diagnosis not present

## 2022-07-05 DIAGNOSIS — L821 Other seborrheic keratosis: Secondary | ICD-10-CM | POA: Diagnosis not present

## 2022-07-05 DIAGNOSIS — L218 Other seborrheic dermatitis: Secondary | ICD-10-CM | POA: Diagnosis not present

## 2022-07-29 NOTE — Progress Notes (Signed)
Cardiology Office Note   Date:  07/30/2022   ID:  [name], DOB [birth year], MRN [id number]  PCP:  Lauree Chandler, NP    No chief complaint on file.  RBBB  Wt Readings from Last 3 Encounters:  07/30/22 210 lb 9.6 oz (95.5 kg)  06/07/22 215 lb 12.8 oz (97.9 kg)  03/08/22 211 lb 12.8 oz (96.1 kg)       History of Present Illness: [name] is a 67 y.o. male with right bundle branch block.  Prior records show: "He has several risk factors for heart disease including diabetes, hypertension, hyperlipidemia.  He is treated for sleep apnea with CPAP.   Father had MI at 45.  Mother alive at 90- HTN, DM."  Calcium scoring CT in January 2023 showed: " Left main: 0   Left anterior descending artery: 0.54   Left circumflex artery: 115   Right coronary artery: 15.1   Total: 131   Percentile: 60th   Pericardium: Normal.   Ascending Aorta: Normal caliber.   Non-cardiac: See separate report from [facility].   IMPRESSION: Coronary calcium score of 131 Agatston units. This was 60th percentile for age-, race-, and sex-matched controls."  He has felt well.  He walks 5x/week for 30 minutes a day.  He gets his HR up.    Denies : Chest pain. Dizziness. Leg edema. Nitroglycerin use. Orthopnea. Palpitations. Paroxysmal nocturnal dyspnea. Shortness of breath. Syncope.    Past Medical History:  Diagnosis Date   Allergy    Cancer ([employer])    skin cancer- squamous cell    Diabetes mellitus without complication ([place])    Type 2   H/O lymph node excision 1998   [place] , [place]   Hypertension    Sleep apnea    wears cpap    Squamous cell carcinoma of skin 12/31/2014   RIGHT FOREARM TX = CX3 5FU   Viral pneumonia 1965    Past Surgical History:  Procedure Laterality Date   COLONOSCOPY     LYMPH NODE DISSECTION  1998   perirectal abcess  1989   POLYPECTOMY     SKIN BIOPSY Right 05/21/15   forearm Vida Roller Sheffield PA   WISDOM TOOTH EXTRACTION        Current Outpatient Medications  Medication Sig Dispense Refill   aspirin 81 MG tablet Take 81 mg by mouth daily.     atorvastatin (LIPITOR) 40 MG tablet Take 1 tablet by mouth once daily 90 tablet 3   benazepril (LOTENSIN) 40 MG tablet Take 1 tablet by mouth once daily 90 tablet 3   clobetasol (TEMOVATE) 0.05 % external solution Apply 1 application. topically 2 (two) times daily. 50 mL 6   Dulaglutide (TRULICITY) 1.5 0000000 SOPN Inject 1.5 mg into the skin once a week. 3 mL 1   fluorouracil (EFUDEX) 5 % cream Apply to affected area qd for 2 weeks 40 g 1   fluticasone (FLONASE) 50 MCG/ACT nasal spray Use 2 spray(s) in each nostril once daily 16 g 0   guaiFENesin (MUCINEX) 600 MG 12 hr tablet Take 1,200 mg by mouth 2 (two) times daily.     hydrochlorothiazide (MICROZIDE) 12.5 MG capsule Take 1 capsule by mouth once daily 90 capsule 1   JANUVIA 100 MG tablet TAKE 1 TABLET BY MOUTH ONCE DAILY WITH  LARGEST  MEAL 90 tablet 3   ketoconazole (NIZORAL) 2 % shampoo Apply to scalp and let sit 3-5 minutes then rinse. 120 mL 5  levocetirizine (XYZAL) 5 MG tablet Take 5 mg by mouth every evening.     metFORMIN (GLUCOPHAGE) 1000 MG tablet TAKE 1 TABLET BY MOUTH TWICE DAILY WITH A MEAL 180 tablet 1   minocycline (MINOCIN) 100 MG capsule Take 1 capsule by mouth once daily 30 capsule 0   psyllium (REGULOID) 0.52 G capsule Take 0.52 g by mouth daily.     No current facility-administered medications for this visit.    Allergies:   Fish allergy    Social History:  The patient  reports that he has never smoked. He has never used smokeless tobacco. He reports current alcohol use of about 1.0 standard drink of alcohol per week. He reports that he does not use drugs.   Family History:  The patient's family history includes Diabetes (age of onset: 82) in his mother; Heart disease (age of onset: 69) in his father; Hypertension in his mother.    ROS:  Please see the history of present illness.    Otherwise, review of systems are positive for tying to eat healthy and lose weight.   All other systems are reviewed and negative.    PHYSICAL EXAM: VS:  BP 122/74   Pulse 84   Ht 5' 8"$  (1.727 m)   Wt 210 lb 9.6 oz (95.5 kg)   SpO2 95%   BMI 32.02 kg/m  , BMI Body mass index is 32.02 kg/m. GEN: Well nourished, well developed, in no acute distress HEENT: normal Neck: no JVD, carotid bruits, or masses Cardiac: RRR; no murmurs, rubs, or gallops,no edema  Respiratory:  clear to auscultation bilaterally, normal work of breathing GI: soft, nontender, nondistended, + BS MS: no deformity or atrophy Skin: warm and dry, no rash Neuro:  Strength and sensation are intact Psych: euthymic mood, full affect   EKG:   The ekg ordered today demonstrates NSR, RBBB, PVCs   Recent Labs: 05/31/2022: ALT 23; BUN 13; Creat 0.90; Hemoglobin 15.1; Platelets 247; Potassium 3.8; Sodium 141   Lipid Panel    Component Value Date/Time   CHOL 111 09/28/2021 0000   TRIG 65 09/28/2021 0000   HDL 46 09/28/2021 0000   CHOLHDL 2.4 09/28/2021 0000   CHOLHDL 2.4 04/13/2021 0816   VLDL 14 01/13/2017 0817   LDLCALC 51 09/28/2021 0000   LDLCALC 47 04/13/2021 0816     Other studies Reviewed: Additional studies/ records that were reviewed today with results demonstrating: labs reviewed.   ASSESSMENT AND PLAN:  Right bundle branch block: This has been present since at least 2018. Hyperlipidemia with type 2 diabetes: LDL 51 April 2023.A1c 8.4 in December 2023.  Whole food plant-based diet.  High-fiber diet.Avoiding processed foods. Hypertension: The current medical regimen is effective;  continue present plan and medications. Obstructive sleep apnea: Continue CPAP.    Current medicines are reviewed at length with the patient today.  The patient concerns regarding his medicines were addressed.  The following changes have been made:  No change  Labs/ tests ordered today include:  No orders of the  defined types were placed in this encounter.   Recommend 150 minutes/week of aerobic exercise Low fat, low carb, high fiber diet recommended  Disposition:   FU in 1 year   Signed, Larae Grooms, MD  07/30/2022 4:31 PM    [facility] [address], [place],   [address] Phone: [phone]; Fax: [phone]

## 2022-07-30 ENCOUNTER — Ambulatory Visit: Payer: BC Managed Care – PPO | Attending: Interventional Cardiology | Admitting: Interventional Cardiology

## 2022-07-30 VITALS — BP 122/74 | HR 84 | Ht 68.0 in | Wt 210.6 lb

## 2022-07-30 DIAGNOSIS — E782 Mixed hyperlipidemia: Secondary | ICD-10-CM | POA: Diagnosis not present

## 2022-07-30 DIAGNOSIS — I10 Essential (primary) hypertension: Secondary | ICD-10-CM

## 2022-07-30 DIAGNOSIS — E1159 Type 2 diabetes mellitus with other circulatory complications: Secondary | ICD-10-CM

## 2022-07-30 DIAGNOSIS — I4510 Unspecified right bundle-branch block: Secondary | ICD-10-CM

## 2022-07-30 NOTE — Patient Instructions (Signed)

## 2022-07-31 ENCOUNTER — Other Ambulatory Visit: Payer: Self-pay | Admitting: Nurse Practitioner

## 2022-07-31 DIAGNOSIS — J309 Allergic rhinitis, unspecified: Secondary | ICD-10-CM

## 2022-08-01 ENCOUNTER — Encounter: Payer: Self-pay | Admitting: Interventional Cardiology

## 2022-08-07 IMAGING — CT CT CARDIAC CORONARY ARTERY CALCIUM SCORE
3 series · 14 of 20 positions shown, 16 images · non-contrast
Comparison: None.
COMPARISON: None.

Addendum:
EXAM:
OVER-READ INTERPRETATION  CT CHEST

The following report is an over-read performed by radiologist Dr.
Yemi Rijo [REDACTED] on 06/25/2021. This over-read
does not include interpretation of cardiac or coronary anatomy or
pathology. The calcium score interpretation by the cardiologist is
attached.
CLINICAL DATA: Cardiovascular Disease Risk stratification
Coronary Calcium Score
TECHNIQUE: A gated, non-contrast computed tomography scan of the heart was
performed using 3mm slice thickness. Axial images were analyzed on a
dedicated workstation. Calcium scoring of the coronary arteries was
performed using the Agatston method.

[Series 2: cascseq 2.0 sa36 (id) (id) · axial · 0.40mm/px · z∈[-291,-201]mm · 4 of 76 slices shown]
[im 16/76  vessel]
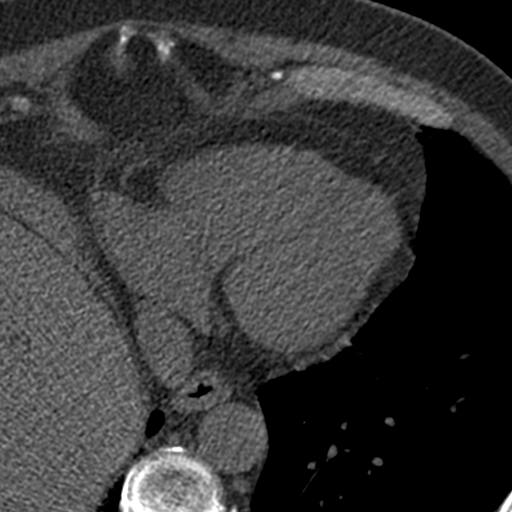
[im 31/76  vessel]
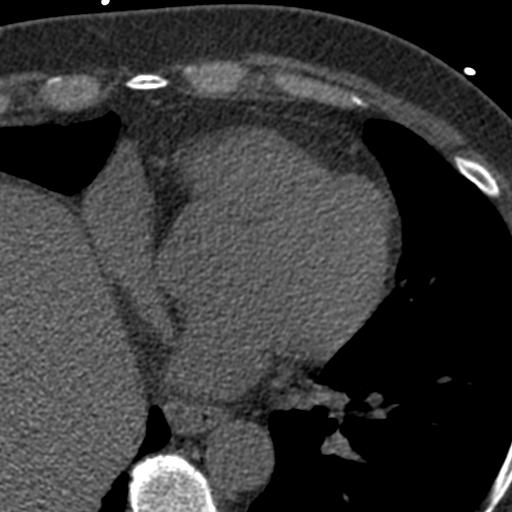
[im 46/76  vessel]
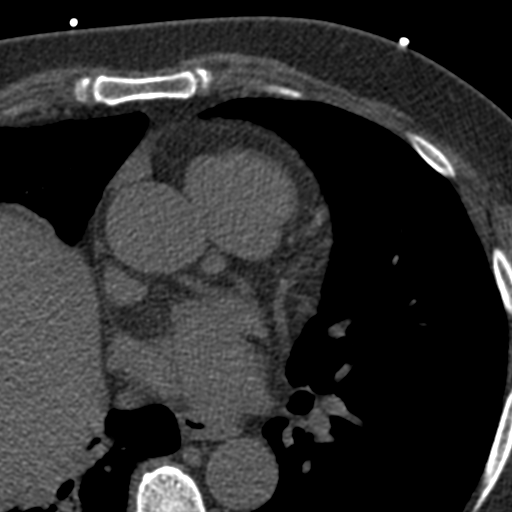
[im 61/76  vessel]
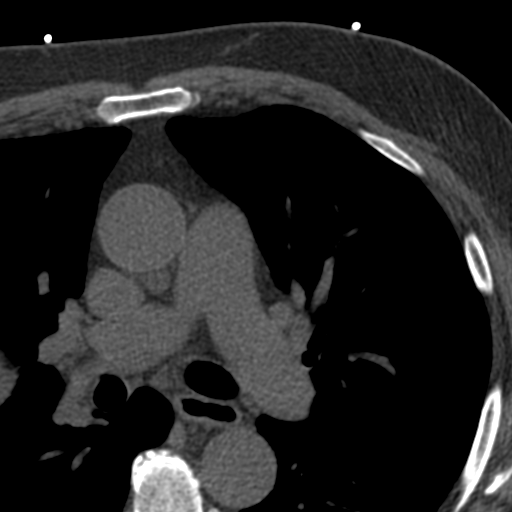

[Series 3: cascseq 2.0 bf37 st · axial · 0.77mm/px · z∈[-297,-197]mm · 5 of 76 slices shown, 7 images]
[im 13/76  vessel]
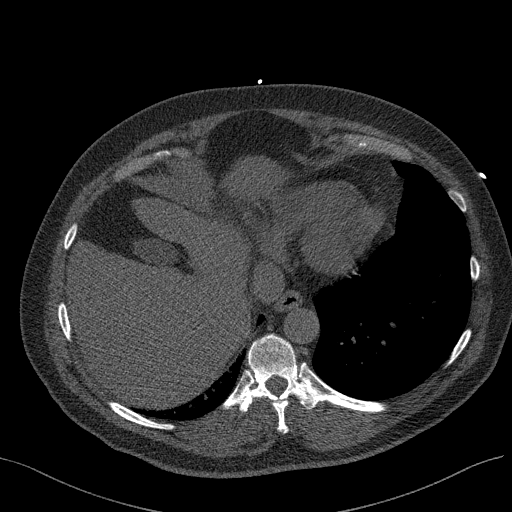
[im 13/76  lung]
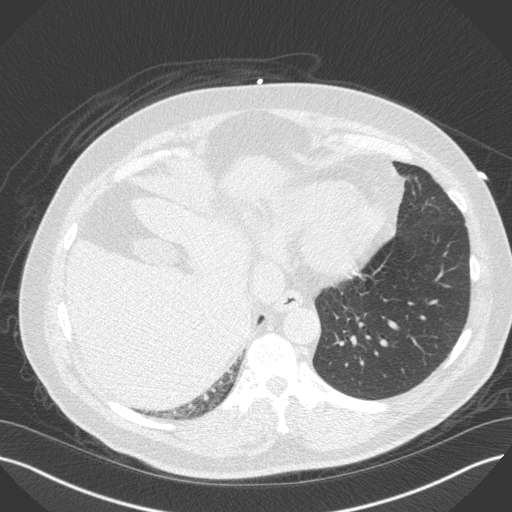
[im 26/76  vessel]
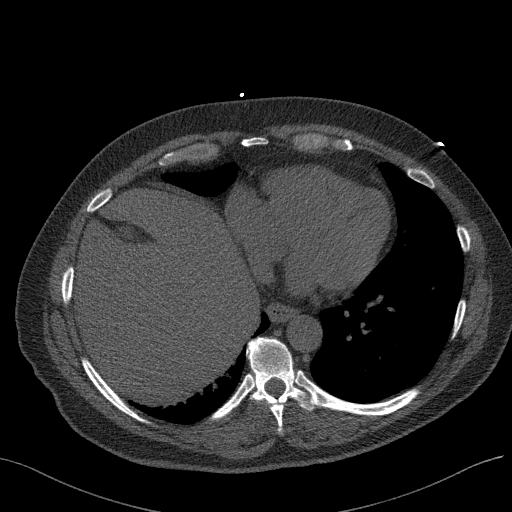
[im 38/76  vessel]
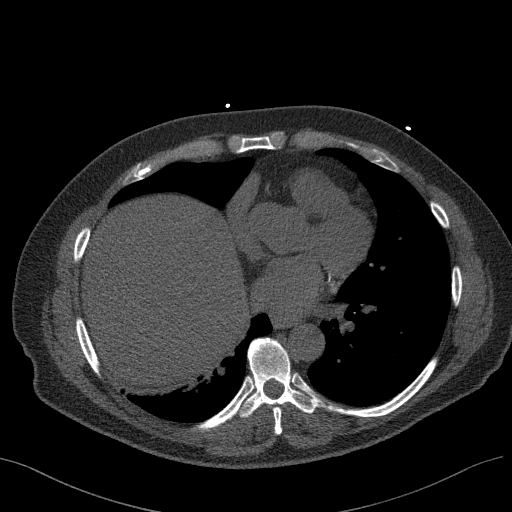
[im 51/76  vessel]
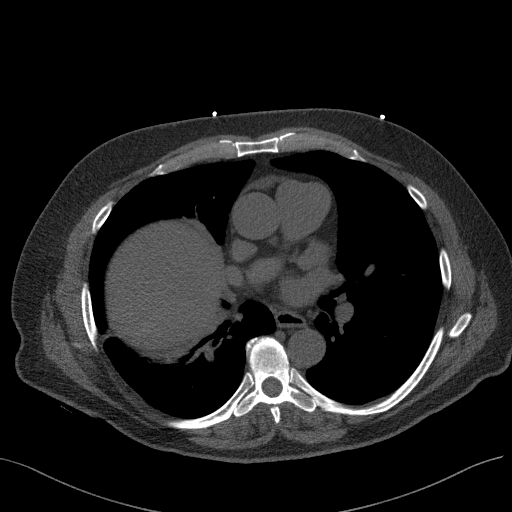
[im 63/76  vessel]
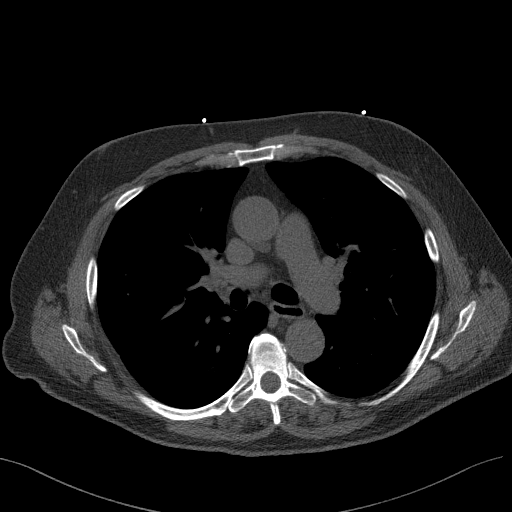
[im 63/76  lung]
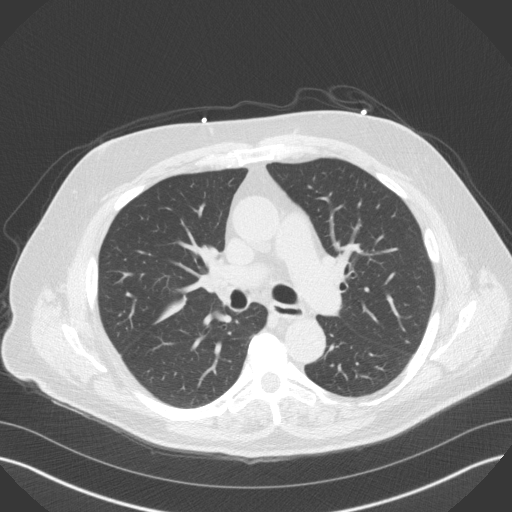

[Series 4: cascseq 2.0 br59 lung · axial · 0.77mm/px · z∈[-297,-197]mm · 5 of 76 slices shown]
[im 13/76  lung]
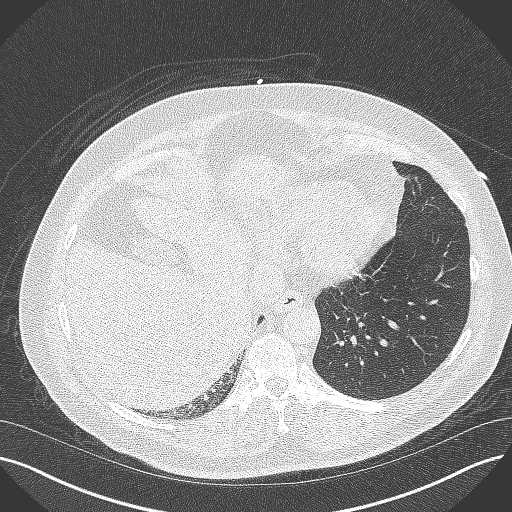
[im 26/76  lung]
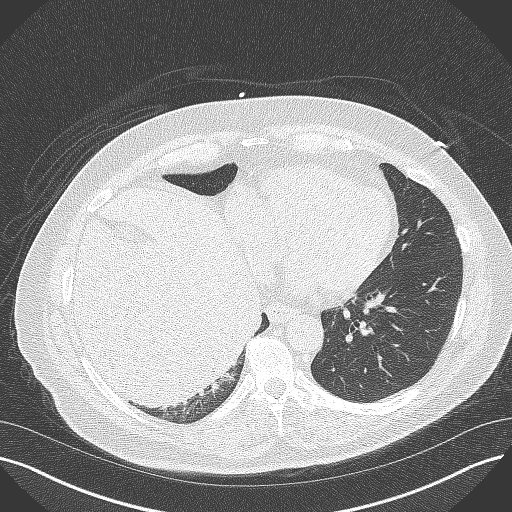
[im 38/76  lung]
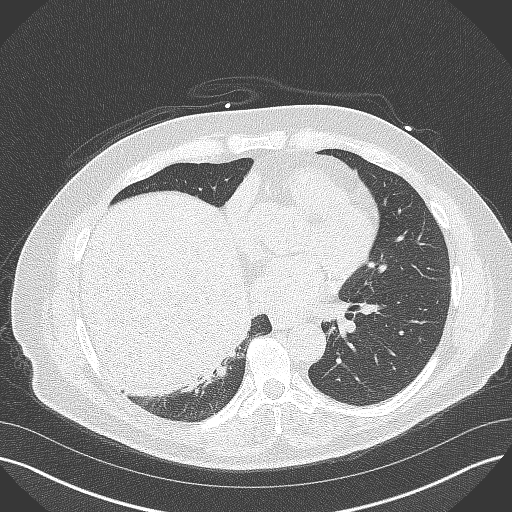
[im 51/76  lung]
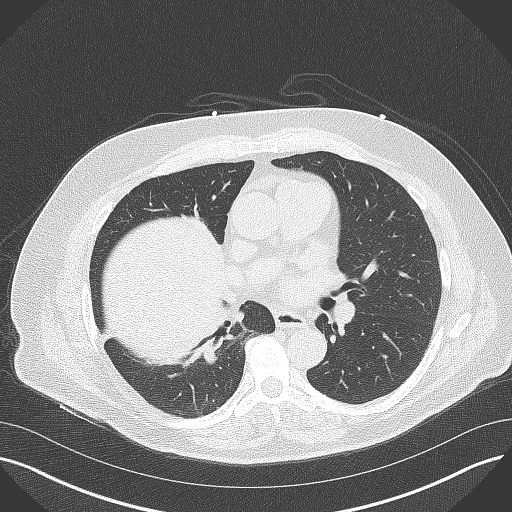
[im 63/76  lung]
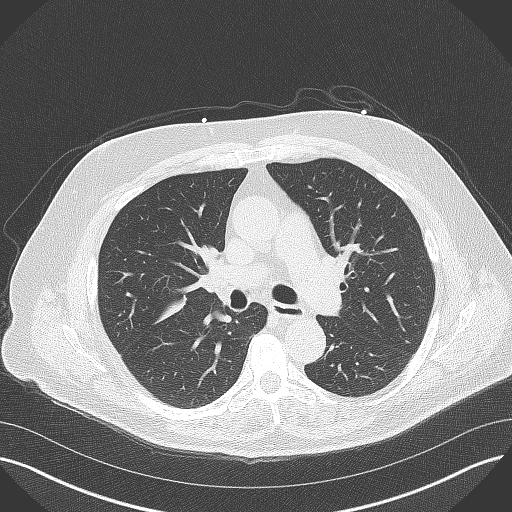

[14 of 20 positions shown; findings below may reference images not displayed]

FINDINGS: Vascular: Aortic atherosclerosis.

Mediastinum/Nodes: No imaged thoracic adenopathy.

Lungs/Pleura: No pleural fluid. Marked right hemidiaphragm
elevation. Mild adjacent right base volume loss.

Upper Abdomen: Normal imaged portions of the liver, gallbladder.

Musculoskeletal: No acute osseous abnormality.
IMPRESSION: 1.  No acute findings in the imaged extracardiac chest.
2.  Aortic Atherosclerosis (QQIG7-EXI.I).
3. Marked right hemidiaphragm elevation.
FINDINGS: Coronary arteries: Normal origins.

Coronary Calcium Score:

Left main: 0

Left anterior descending artery:

Left circumflex artery: 115

Right coronary artery:

Total: 131

Percentile: 60th

Pericardium: Normal.

Ascending Aorta: Normal caliber.

Non-cardiac: See separate report from [REDACTED].
IMPRESSION: Coronary calcium score of 131 Agatston units. This was 60th
percentile for age-, race-, and sex-matched controls.



If CAC=0, it is reasonable to withhold statin therapy and reassess
in 5 to 10 years, as long as higher risk conditions are absent
(diabetes mellitus, family history of premature CHD in first degree
relatives (males <55 years; females <65 years), cigarette smoking,
or LDL >=190 mg/dL).

If CAC is 1 to 99, it is reasonable to initiate statin therapy for
patients >=55 years of age.

If CAC is >=100 or >=75th percentile, it is reasonable to initiate
statin therapy at any age.

Cardiology referral should be considered for patients with CAC
scores >=400 or >=75th percentile.

*0345 AHA/ACC/AACVPR/AAPA/ABC/MADGALENA/GAB/CREECH/Themsen/ABDERAFIE/DAGUISMOL/AVON
Guideline on the Management of Blood Cholesterol: A Report of the
American College of Cardiology/American Heart Association Task Force
on Clinical Practice Guidelines. J Am Coll Cardiol.
0544;73(24):3534-3977.

*** End of Addendum ***
EXAM:
OVER-READ INTERPRETATION  CT CHEST

The following report is an over-read performed by radiologist Dr.
Yemi Rijo [REDACTED] on 06/25/2021. This over-read
does not include interpretation of cardiac or coronary anatomy or
pathology. The calcium score interpretation by the cardiologist is
attached.
FINDINGS: Vascular: Aortic atherosclerosis.

Mediastinum/Nodes: No imaged thoracic adenopathy.

Lungs/Pleura: No pleural fluid. Marked right hemidiaphragm
elevation. Mild adjacent right base volume loss.

Upper Abdomen: Normal imaged portions of the liver, gallbladder.

Musculoskeletal: No acute osseous abnormality.
IMPRESSION: 1.  No acute findings in the imaged extracardiac chest.
2.  Aortic Atherosclerosis (QQIG7-EXI.I).
3. Marked right hemidiaphragm elevation.

## 2022-08-22 ENCOUNTER — Other Ambulatory Visit: Payer: Self-pay | Admitting: Nurse Practitioner

## 2022-08-22 DIAGNOSIS — E1169 Type 2 diabetes mellitus with other specified complication: Secondary | ICD-10-CM

## 2022-08-23 NOTE — Telephone Encounter (Signed)
Patient has request refill on medication Trulicity 1.'5mg'$ . Patient medication has warnings. Medication pend and sent to PCP Dewaine Oats Carlos American, NP for approval.

## 2022-08-25 ENCOUNTER — Other Ambulatory Visit: Payer: Self-pay

## 2022-08-25 DIAGNOSIS — I10 Essential (primary) hypertension: Secondary | ICD-10-CM

## 2022-08-25 DIAGNOSIS — E1169 Type 2 diabetes mellitus with other specified complication: Secondary | ICD-10-CM

## 2022-08-31 ENCOUNTER — Encounter: Payer: Self-pay | Admitting: Nurse Practitioner

## 2022-08-31 DIAGNOSIS — Z6832 Body mass index (BMI) 32.0-32.9, adult: Secondary | ICD-10-CM

## 2022-08-31 DIAGNOSIS — E1169 Type 2 diabetes mellitus with other specified complication: Secondary | ICD-10-CM

## 2022-08-31 MED ORDER — TIRZEPATIDE 2.5 MG/0.5ML ~~LOC~~ SOAJ: 2.5000 mg | SUBCUTANEOUS | 3 refills | Status: DC

## 2022-09-10 ENCOUNTER — Other Ambulatory Visit: Payer: BC Managed Care – PPO

## 2022-09-10 DIAGNOSIS — I10 Essential (primary) hypertension: Secondary | ICD-10-CM | POA: Diagnosis not present

## 2022-09-10 DIAGNOSIS — E785 Hyperlipidemia, unspecified: Secondary | ICD-10-CM | POA: Diagnosis not present

## 2022-09-10 DIAGNOSIS — Z79899 Other long term (current) drug therapy: Secondary | ICD-10-CM

## 2022-09-10 DIAGNOSIS — E782 Mixed hyperlipidemia: Secondary | ICD-10-CM

## 2022-09-10 DIAGNOSIS — E1169 Type 2 diabetes mellitus with other specified complication: Secondary | ICD-10-CM | POA: Diagnosis not present

## 2022-09-11 LAB — COMPLETE METABOLIC PANEL WITH GFR
AG Ratio: 1.7 (calc) (ref 1.0–2.5)
ALT: 22 U/L (ref 9–46)
AST: 18 U/L (ref 10–35)
Albumin: 4.1 g/dL (ref 3.6–5.1)
Alkaline phosphatase (APISO): 62 U/L (ref 35–144)
BUN: 17 mg/dL (ref 7–25)
CO2: 25 mmol/L (ref 20–32)
Calcium: 9.4 mg/dL (ref 8.6–10.3)
Chloride: 104 mmol/L (ref 98–110)
Creat: 0.94 mg/dL (ref 0.70–1.35)
Globulin: 2.4 g/dL (calc) (ref 1.9–3.7)
Glucose, Bld: 160 mg/dL — ABNORMAL HIGH (ref 65–99)
Potassium: 4.3 mmol/L (ref 3.5–5.3)
Sodium: 141 mmol/L (ref 135–146)
Total Bilirubin: 1.1 mg/dL (ref 0.2–1.2)
Total Protein: 6.5 g/dL (ref 6.1–8.1)
eGFR: 89 mL/min/{1.73_m2} (ref >=60)

## 2022-09-11 LAB — CBC WITH DIFFERENTIAL/PLATELET
Absolute Monocytes: 457 cells/uL (ref 200–950)
Basophils Absolute: 28 cells/uL (ref 0–200)
Basophils Relative: 0.5 %
Eosinophils Absolute: 99 cells/uL (ref 15–500)
Eosinophils Relative: 1.8 %
HCT: 44 % (ref 38.5–50.0)
Hemoglobin: 14.8 g/dL (ref 13.2–17.1)
Lymphs Abs: 1205 cells/uL (ref 850–3900)
MCH: 30.5 pg (ref 27.0–33.0)
MCHC: 33.6 g/dL (ref 32.0–36.0)
MCV: 90.7 fL (ref 80.0–100.0)
MPV: 9.1 fL (ref 7.5–12.5)
Monocytes Relative: 8.3 %
Neutro Abs: 3713 cells/uL (ref 1500–7800)
Neutrophils Relative %: 67.5 %
Platelets: 277 10*3/uL (ref 140–400)
RBC: 4.85 10*6/uL (ref 4.20–5.80)
RDW: 12.2 % (ref 11.0–15.0)
Total Lymphocyte: 21.9 %
WBC: 5.5 10*3/uL (ref 3.8–10.8)

## 2022-09-11 LAB — LIPID PANEL
Cholesterol: 104 mg/dL (ref <200)
HDL: 48 mg/dL (ref >=40)
LDL Cholesterol (Calc): 42 mg/dL (calc)
Non-HDL Cholesterol (Calc): 56 mg/dL (calc) (ref <130)
Total CHOL/HDL Ratio: 2.2 (calc) (ref <5.0)
Triglycerides: 62 mg/dL (ref <150)

## 2022-09-11 LAB — HEMOGLOBIN A1C
Hgb A1c MFr Bld: 7.8 % of total Hgb — ABNORMAL HIGH (ref <5.7)
Mean Plasma Glucose: 177 mg/dL
eAG (mmol/L): 9.8 mmol/L

## 2022-09-13 ENCOUNTER — Encounter: Payer: Self-pay | Admitting: Nurse Practitioner

## 2022-09-13 ENCOUNTER — Ambulatory Visit: Payer: BC Managed Care – PPO | Admitting: Nurse Practitioner

## 2022-09-13 VITALS — BP 126/78 | HR 76 | Temp 97.9°F | Ht 66.6 in | Wt 211.0 lb

## 2022-09-13 DIAGNOSIS — I10 Essential (primary) hypertension: Secondary | ICD-10-CM | POA: Diagnosis not present

## 2022-09-13 DIAGNOSIS — K5900 Constipation, unspecified: Secondary | ICD-10-CM

## 2022-09-13 DIAGNOSIS — Z6832 Body mass index (BMI) 32.0-32.9, adult: Secondary | ICD-10-CM

## 2022-09-13 DIAGNOSIS — E785 Hyperlipidemia, unspecified: Secondary | ICD-10-CM

## 2022-09-13 DIAGNOSIS — E6609 Other obesity due to excess calories: Secondary | ICD-10-CM

## 2022-09-13 DIAGNOSIS — E1169 Type 2 diabetes mellitus with other specified complication: Secondary | ICD-10-CM | POA: Diagnosis not present

## 2022-09-13 DIAGNOSIS — I4510 Unspecified right bundle-branch block: Secondary | ICD-10-CM

## 2022-09-13 DIAGNOSIS — H938X3 Other specified disorders of ear, bilateral: Secondary | ICD-10-CM

## 2022-09-13 DIAGNOSIS — J3089 Other allergic rhinitis: Secondary | ICD-10-CM

## 2022-09-13 DIAGNOSIS — E66811 Obesity, class 1: Secondary | ICD-10-CM

## 2022-09-13 MED ORDER — TIRZEPATIDE 5 MG/0.5ML ~~LOC~~ SOAJ: 5.0000 mg | SUBCUTANEOUS | 1 refills | Status: DC

## 2022-09-13 NOTE — Patient Instructions (Addendum)
Goal fasting range is 80-110.  Stop [place]- let pharmacy know you are stopping this.   Increase mounjaro to 5 mg weekly   Follow up in 3 months, labs prior

## 2022-09-13 NOTE — Progress Notes (Signed)
Careteam: Patient Care Team: Lauree Chandler, NP as PCP - General (Geriatric Medicine) Jettie Booze, MD as PCP - Cardiology (Cardiology) Lavonna Monarch, MD (Inactive) as Consulting Physician (Dermatology) Leandrew Koyanagi, MD as Attending Physician (Orthopedic Surgery) Roel Cluck, MD as Referring Physician (Ophthalmology) Deneise Lever, MD as Consulting Physician (Pulmonary Disease) Sheffield, Ronalee Red, PA-C as Physician Assistant (Dermatology)  PLACE OF SERVICE:  [place] Directive information Does Patient Have a Medical Advance Directive?: No, Would patient like information on creating a medical advance directive?: Yes (MAU/Ambulatory/Procedural Areas - Information given)  Allergies  Allergen Reactions   Fish Allergy Swelling    Tuna fish--- Swelling of lips     Chief Complaint  Patient presents with   Medical Management of Chronic Issues    3 month follow, discuss labs, and foot exam today. Discuss need for colonoscopy and covid boosters.      HPI: Patient is a 67 y.o. male here for medical management of chronic conditions.   He changed from trulicity to [facility] since his last visit. Denies nausea, constipation. Appetite remains about the same.  Fasting blood sugars are about 130 in the morning which is not far off from where they have been. A1C is down to 7.8 but still not at goal.   He is trying to reduce his carbohydrates and avoids sweets. He does not have sweet drinks either only unsweetened tea or water.   He walks about 20 minutes 5 days a week. He wants to increase his exercise to try to help lower his A1C.   Seeing a new dermatologist at [facility]. He had a basal cell carcinoma removed on the back of his neck. He will go back in July for a f/u. He has a condition he is also being treated for on his scalp but unable to remember what it is. He is taking a new antibiotic as needed for flare-ups doxycycline was discontinued.    Occasional swelling in legs, cardiologist told him to elevate his feet as able when he is sitting or lying down. Denies SOB or CP. Sees cardiology annually for R bundle branch block.   Just saw an ophthalmologist in November for diabetic eye exam. Does not need to see a podiatrist at the moment but does pay special attention to his feet with the diabetes.    Review of Systems:  Review of Systems  Constitutional:  Negative for chills, fever, malaise/fatigue and weight loss.  HENT:  Negative for congestion and sore throat.   Eyes:  Negative for blurred vision.  Respiratory:  Negative for cough, shortness of breath and wheezing.   Cardiovascular:  Negative for chest pain, palpitations and leg swelling.  Gastrointestinal:  Negative for abdominal pain, blood in stool, constipation, diarrhea, heartburn, nausea and vomiting.  Genitourinary:  Negative for dysuria, frequency, hematuria and urgency.  Musculoskeletal:  Negative for falls and joint pain.  Skin:  Negative for rash.  Neurological:  Negative for dizziness, tingling and headaches.  Endo/Heme/Allergies:  Negative for polydipsia.  Psychiatric/Behavioral:  Negative for depression. The patient is not nervous/anxious.     Past Medical History:  Diagnosis Date   Allergy    Cancer ([place])    skin cancer- squamous cell    Diabetes mellitus without complication ([place])    Type 2   H/O lymph node excision 1998   [place] , [place]   Hypertension    Sleep apnea    wears cpap    Squamous  cell carcinoma of skin 12/31/2014   RIGHT FOREARM TX = CX3 5FU   Viral pneumonia 1965   Past Surgical History:  Procedure Laterality Date   COLONOSCOPY     LYMPH NODE DISSECTION  1998   MOLE REMOVAL  07/08/2021   Basal cell carcinoma- [facility]   perirectal abcess  1989   POLYPECTOMY     SKIN BIOPSY Right 05/21/2015   forearm Robyne Askew PA   WISDOM TOOTH EXTRACTION     Social History:   reports that he has never smoked. He has never used  smokeless tobacco. He reports current alcohol use of about 1.0 standard drink of alcohol per week. He reports that he does not use drugs.  Family History  Problem Relation Age of Onset   Hypertension Mother    Diabetes Mother 30       Diabetes type 2   Heart disease Father 30       Heart Attack   Colon cancer Neg Hx    Colon polyps Neg Hx    Esophageal cancer Neg Hx    Rectal cancer Neg Hx    Stomach cancer Neg Hx     Medications: Patient's Medications  New Prescriptions   TIRZEPATIDE (MOUNJARO) 5 MG/0.5ML PEN    Inject 5 mg into the skin once a week.  Previous Medications   ASPIRIN 81 MG TABLET    Take 81 mg by mouth daily.   ATORVASTATIN (LIPITOR) 40 MG TABLET    Take 1 tablet by mouth once daily   BENAZEPRIL (LOTENSIN) 40 MG TABLET    Take 1 tablet by mouth once daily   CLOBETASOL (TEMOVATE) 0.05 % EXTERNAL SOLUTION    Apply 1 application. topically 2 (two) times daily.   DOXYCYCLINE (ADOXA) 100 MG TABLET    Take 100 mg by mouth daily.   FLUOROURACIL (EFUDEX) 5 % CREAM    Apply to affected area qd for 2 weeks   FLUTICASONE (FLONASE) 50 MCG/ACT NASAL SPRAY    Use 2 spray(s) in each nostril once daily   GUAIFENESIN (MUCINEX) 600 MG 12 HR TABLET    Take 1,200 mg by mouth 2 (two) times daily.   HYDROCHLOROTHIAZIDE (MICROZIDE) 12.5 MG CAPSULE    Take 1 capsule by mouth once daily   KETOCONAZOLE (NIZORAL) 2 % SHAMPOO    Apply to scalp and let sit 3-5 minutes then rinse.   LEVOCETIRIZINE (XYZAL) 5 MG TABLET    Take 5 mg by mouth every evening.   METFORMIN (GLUCOPHAGE) 1000 MG TABLET    TAKE 1 TABLET BY MOUTH TWICE DAILY WITH A MEAL   PSYLLIUM (REGULOID) 0.52 G CAPSULE    Take 0.52 g by mouth daily.  Modified Medications   No medications on file  Discontinued Medications   JANUVIA 100 MG TABLET    TAKE 1 TABLET BY MOUTH ONCE DAILY WITH  LARGEST  MEAL   TIRZEPATIDE (MOUNJARO) 2.5 MG/0.5ML PEN    Inject 2.5 mg into the skin once a week.    Physical Exam:  Vitals:   09/13/22 0843   BP: 126/78  Pulse: 76  Temp: 97.9 F (36.6 C)  SpO2: 96%  Weight: 211 lb (95.7 kg)  Height: 5' 6.6" (1.692 m)   Body mass index is 33.45 kg/m. Wt Readings from Last 3 Encounters:  09/13/22 211 lb (95.7 kg)  07/30/22 210 lb 9.6 oz (95.5 kg)  06/07/22 215 lb 12.8 oz (97.9 kg)    Physical Exam Constitutional:  General: He is not in acute distress.    Appearance: He is well-developed. He is not diaphoretic.  HENT:     Head: Normocephalic and atraumatic.     Right Ear: External ear normal.     Left Ear: External ear normal.     Mouth/Throat:     Pharynx: No oropharyngeal exudate.  Eyes:     Conjunctiva/sclera: Conjunctivae normal.     Pupils: Pupils are equal, round, and reactive to light.  Cardiovascular:     Rate and Rhythm: Normal rate and regular rhythm.     Heart sounds: Normal heart sounds.  Pulmonary:     Effort: Pulmonary effort is normal.     Breath sounds: Normal breath sounds.  Abdominal:     General: Bowel sounds are normal.     Palpations: Abdomen is soft.  Musculoskeletal:        General: No tenderness.     Cervical back: Normal range of motion and neck supple.     Right lower leg: No edema.     Left lower leg: No edema.  Skin:    General: Skin is warm and dry.  Neurological:     Mental Status: He is alert and oriented to person, place, and time.  Psychiatric:        Mood and Affect: Mood normal.     Labs reviewed: Basic Metabolic Panel: Recent Labs    05/31/22 0817 09/10/22 0827  NA 141 141  K 3.8 4.3  CL 104 104  CO2 29 25  GLUCOSE 154* 160*  BUN 13 17  CREATININE 0.90 0.94  CALCIUM 8.8 9.4   Liver Function Tests: Recent Labs    09/28/21 0000 05/31/22 0817 09/10/22 0827  AST 19 19 18   ALT 18 23 22   ALKPHOS 66  --   --   BILITOT 1.2 1.5* 1.1  PROT 6.1 6.4 6.5  ALBUMIN 4.2  --   --    No results for input(s): "LIPASE", "AMYLASE" in the last 8760 hours. No results for input(s): "AMMONIA" in the last 8760  hours. CBC: Recent Labs    05/31/22 0817 09/10/22 0827  WBC 4.7 5.5  NEUTROABS 3,111 3,713  HGB 15.1 14.8  HCT 43.9 44.0  MCV 89.4 90.7  PLT 247 277   Lipid Panel: Recent Labs    09/28/21 0000 09/10/22 0827  CHOL 111 104  HDL 46 48  LDLCALC 51 42  TRIG 65 62  CHOLHDL 2.4 2.2   TSH: No results for input(s): "TSH" in the last 8760 hours. A1C: Lab Results  Component Value Date   HGBA1C 7.8 (H) 09/10/2022     Assessment/Plan 1. Type 2 diabetes mellitus with other specified complication, without long-term current use of insulin (HCC) Discussed making changes in medications and diet/lifestyle with patient. He wants to increase his exercise. Finish current prescription of Januvia then d/c. Increase mounjaro to 5 mg in 2 weeks and recheck A1C in 3 months. Continue metformin.  - tirzepatide [facility]) 5 MG/0.5ML Pen; Inject 5 mg into the skin once a week.  Dispense: 6 mL; Refill: 1  2. Class 1 obesity due to excess calories with serious comorbidity and body mass index (BMI) of 32.0 to 32.9 in adult Discussed weight loss with mounjaro, increasing exercise, reducing carbohydrates and starches.   3. Essential hypertension BP at goal. Continue lotensin, hydrochlorothiazide, low sodium salt.  4. Constipation, unspecified constipation type Pt denies trouble with constipation at this time; discussed risk of GI side effects with  increased dosage of mounjaro.   5. Hyperlipidemia associated with type 2 diabetes mellitus ([place]) Lipid panel at goal. Continue lipitor, Reduce fatty and fried foods.  6. Right bundle branch block Stable. Follows with Dr. Irish Lack.  7. Environmental and seasonal allergies Continue xyzal daily and flonase as needed. Typically well-managed, started medications back in February. Some sinus drainage but reports typical for this time of year.  8. Ear pressure, bilateral -he is blowing his nose a lot. Recommend saline spray and reduce nose blowing. Avoid  environmental allergy triggers as able.   Return in about 3 months (around 12/14/2022) for routine follow up- labs prior to visit. .:   Student- Archer Asa O'Berry ACPCNP-S  I personally was present during the history, physical exam and medical decision-making activities of this service and have verified that the service and findings are accurately documented in the student's note Saniyah Mondesir K. Garceno, [facility] [phone]

## 2022-09-17 ENCOUNTER — Other Ambulatory Visit: Payer: Self-pay | Admitting: Nurse Practitioner

## 2022-09-17 DIAGNOSIS — J309 Allergic rhinitis, unspecified: Secondary | ICD-10-CM

## 2022-10-02 ENCOUNTER — Other Ambulatory Visit: Payer: Self-pay | Admitting: Nurse Practitioner

## 2022-10-02 DIAGNOSIS — I10 Essential (primary) hypertension: Secondary | ICD-10-CM

## 2022-11-07 ENCOUNTER — Other Ambulatory Visit: Payer: Self-pay | Admitting: Nurse Practitioner

## 2022-11-07 DIAGNOSIS — E119 Type 2 diabetes mellitus without complications: Secondary | ICD-10-CM

## 2022-11-15 ENCOUNTER — Other Ambulatory Visit: Payer: Self-pay | Admitting: Nurse Practitioner

## 2022-11-15 DIAGNOSIS — E1169 Type 2 diabetes mellitus with other specified complication: Secondary | ICD-10-CM

## 2022-11-21 ENCOUNTER — Encounter: Payer: Self-pay | Admitting: Nurse Practitioner

## 2022-12-06 ENCOUNTER — Other Ambulatory Visit: Payer: Self-pay | Admitting: Nurse Practitioner

## 2022-12-06 DIAGNOSIS — I10 Essential (primary) hypertension: Secondary | ICD-10-CM

## 2022-12-14 ENCOUNTER — Other Ambulatory Visit: Payer: BC Managed Care – PPO

## 2022-12-14 DIAGNOSIS — E1169 Type 2 diabetes mellitus with other specified complication: Secondary | ICD-10-CM

## 2022-12-15 LAB — COMPLETE METABOLIC PANEL WITH GFR
AG Ratio: 2.2 (calc) (ref 1.0–2.5)
ALT: 18 U/L (ref 9–46)
AST: 17 U/L (ref 10–35)
Albumin: 4.2 g/dL (ref 3.6–5.1)
Alkaline phosphatase (APISO): 54 U/L (ref 35–144)
BUN: 12 mg/dL (ref 7–25)
CO2: 26 mmol/L (ref 20–32)
Calcium: 9.1 mg/dL (ref 8.6–10.3)
Chloride: 107 mmol/L (ref 98–110)
Creat: 0.85 mg/dL (ref 0.70–1.35)
Globulin: 1.9 g/dL (calc) (ref 1.9–3.7)
Glucose, Bld: 111 mg/dL — ABNORMAL HIGH (ref 65–99)
Potassium: 3.9 mmol/L (ref 3.5–5.3)
Sodium: 142 mmol/L (ref 135–146)
Total Bilirubin: 1.5 mg/dL — ABNORMAL HIGH (ref 0.2–1.2)
Total Protein: 6.1 g/dL (ref 6.1–8.1)
eGFR: 96 mL/min/{1.73_m2} (ref >=60)

## 2022-12-15 LAB — HEMOGLOBIN A1C
Hgb A1c MFr Bld: 6.8 % of total Hgb — ABNORMAL HIGH (ref <5.7)
Mean Plasma Glucose: 148 mg/dL
eAG (mmol/L): 8.2 mmol/L

## 2022-12-16 ENCOUNTER — Encounter: Payer: Self-pay | Admitting: Nurse Practitioner

## 2022-12-17 ENCOUNTER — Encounter: Payer: Self-pay | Admitting: Nurse Practitioner

## 2022-12-17 ENCOUNTER — Ambulatory Visit: Payer: BC Managed Care – PPO | Admitting: Nurse Practitioner

## 2022-12-17 VITALS — BP 122/78 | HR 88 | Temp 97.9°F | Ht 66.5 in | Wt 200.0 lb

## 2022-12-17 DIAGNOSIS — I10 Essential (primary) hypertension: Secondary | ICD-10-CM | POA: Diagnosis not present

## 2022-12-17 DIAGNOSIS — E1169 Type 2 diabetes mellitus with other specified complication: Secondary | ICD-10-CM | POA: Diagnosis not present

## 2022-12-17 DIAGNOSIS — Z6831 Body mass index (BMI) 31.0-31.9, adult: Secondary | ICD-10-CM | POA: Diagnosis not present

## 2022-12-17 DIAGNOSIS — E6609 Other obesity due to excess calories: Secondary | ICD-10-CM

## 2022-12-17 NOTE — Progress Notes (Signed)
Careteam: Patient Care Team: Sharon Seller, NP as PCP - General (Geriatric Medicine) Corky Crafts, MD as PCP - Cardiology (Cardiology) Janalyn Harder, MD (Inactive) as Consulting Physician (Dermatology) Tarry Kos, MD as Attending Physician (Orthopedic Surgery) Lovenia Shuck, MD as Referring Physician (Ophthalmology) Waymon Budge, MD as Consulting Physician (Pulmonary Disease) Sheffield, Judye Bos, PA-C as Physician Assistant (Dermatology)  PLACE OF SERVICE:  [facility]  Advanced Directive information Does Patient Have a Medical Advance Directive?: No, Does patient want to make changes to medical advance directive?: Yes (MAU/Ambulatory/Procedural Areas - Information given)  Allergies  Allergen Reactions   Fish Allergy Swelling    Tuna fish--- Swelling of lips     Chief Complaint  Patient presents with   Medical Management of Chronic Issues    3 month follow-up. Discuss need for additional covid boosters. Patient is requesting to have ears checked.      HPI: Patient is a 67 y.o. male for follow up on diabetes.   A1c down to 6.8 on mounjaro 5 mg, occasional constipation but otherwise no side effect.  Continued on metformin 1000 mg BID.  No hypoglycemia noted.  Notices increase in blood sugar if he eats potato.   Blood pressures remain at goal.   Review of Systems:  Review of Systems  Constitutional:  Negative for chills, fever and weight loss.  HENT:  Negative for tinnitus.   Respiratory:  Negative for cough, sputum production and shortness of breath.   Cardiovascular:  Negative for chest pain, palpitations and leg swelling.  Gastrointestinal:  Negative for abdominal pain, constipation, diarrhea and heartburn.  Genitourinary:  Negative for dysuria, frequency and urgency.  Musculoskeletal:  Negative for back pain, falls, joint pain and myalgias.  Skin: Negative.   Neurological:  Negative for dizziness and headaches.  Psychiatric/Behavioral:   Negative for depression and memory loss. The patient does not have insomnia.     Past Medical History:  Diagnosis Date   Allergy    Cancer ([facility]CC)    skin cancer- squamous cell    Diabetes mellitus without complication ([facility])    Type 2   H/O lymph node excision 1998   [place] , [place]   Hypertension    Sleep apnea    wears cpap    Squamous cell carcinoma of skin 12/31/2014   RIGHT FOREARM TX = CX3 5FU   Viral pneumonia 1965   Past Surgical History:  Procedure Laterality Date   COLONOSCOPY     LYMPH NODE DISSECTION  1998   MOLE REMOVAL  07/08/2021   Basal cell carcinoma- [facility]   perirectal abcess  1989   POLYPECTOMY     SKIN BIOPSY Right 05/21/2015   forearm Mackey Birchwood PA   WISDOM TOOTH EXTRACTION     Social History:   reports that he has never smoked. He has never used smokeless tobacco. He reports current alcohol use of about 1.0 standard drink of alcohol per week. He reports that he does not use drugs.  Family History  Problem Relation Age of Onset   Hypertension Mother    Diabetes Mother 16       Diabetes type 2   Heart disease Father 31       Heart Attack   Colon cancer Neg Hx    Colon polyps Neg Hx    Esophageal cancer Neg Hx    Rectal cancer Neg Hx    Stomach cancer Neg Hx     Medications: Patient's Medications  New Prescriptions   No medications on file  Previous Medications   ASPIRIN 81 MG TABLET    Take 81 mg by mouth daily.   ATORVASTATIN (LIPITOR) 40 MG TABLET    Take 1 tablet by mouth once daily   BENAZEPRIL (LOTENSIN) 40 MG TABLET    Take 1 tablet by mouth once daily   CLOBETASOL (TEMOVATE) 0.05 % EXTERNAL SOLUTION    Apply 1 application. topically 2 (two) times daily.   DOXYCYCLINE (ADOXA) 100 MG TABLET    Take 100 mg by mouth daily.   FLUOROURACIL (EFUDEX) 5 % CREAM    Apply to affected area qd for 2 weeks   FLUTICASONE (FLONASE) 50 MCG/ACT NASAL SPRAY    Use 2 spray(s) in each nostril once daily   GUAIFENESIN (MUCINEX) 600 MG 12 HR  TABLET    Take 1,200 mg by mouth 2 (two) times daily.   HYDROCHLOROTHIAZIDE (MICROZIDE) 12.5 MG CAPSULE    Take 1 capsule by mouth once daily   KETOCONAZOLE (NIZORAL) 2 % SHAMPOO    Apply to scalp and let sit 3-5 minutes then rinse.   LEVOCETIRIZINE (XYZAL) 5 MG TABLET    Take 5 mg by mouth every evening.   METFORMIN (GLUCOPHAGE) 1000 MG TABLET    TAKE 1 TABLET BY MOUTH TWICE DAILY WITH A MEAL   PSYLLIUM (REGULOID) 0.52 G CAPSULE    Take 0.52 g by mouth daily.   TIRZEPATIDE (MOUNJARO) 5 MG/0.5ML PEN    INJECT 1 SYRINGE SUBCUTANEOUSLY ONCE A WEEK  Modified Medications   No medications on file  Discontinued Medications   No medications on file    Physical Exam:  Vitals:   12/16/22 1613  BP: 122/78  Pulse: 88  Temp: 97.9 F (36.6 C)  TempSrc: Temporal  SpO2: 97%  Weight: 200 lb (90.7 kg)  Height: 5' 6.5" (1.689 m)   Body mass index is 31.8 kg/m. Wt Readings from Last 3 Encounters:  12/16/22 200 lb (90.7 kg)  09/13/22 211 lb (95.7 kg)  07/30/22 210 lb 9.6 oz (95.5 kg)    Physical Exam Constitutional:      General: He is not in acute distress.    Appearance: He is well-developed. He is not diaphoretic.  HENT:     Head: Normocephalic and atraumatic.     Right Ear: External ear normal.     Left Ear: External ear normal.     Mouth/Throat:     Pharynx: No oropharyngeal exudate.  Eyes:     Conjunctiva/sclera: Conjunctivae normal.     Pupils: Pupils are equal, round, and reactive to light.  Cardiovascular:     Rate and Rhythm: Normal rate and regular rhythm.     Heart sounds: Normal heart sounds.  Pulmonary:     Effort: Pulmonary effort is normal.     Breath sounds: Normal breath sounds.  Abdominal:     General: Bowel sounds are normal.     Palpations: Abdomen is soft.  Musculoskeletal:        General: No tenderness.     Cervical back: Normal range of motion and neck supple.     Right lower leg: No edema.     Left lower leg: No edema.  Skin:    General: Skin is warm  and dry.  Neurological:     Mental Status: He is alert and oriented to person, place, and time.     Labs reviewed: Basic Metabolic Panel: Recent Labs    05/31/22 0817 09/10/22 0827 12/14/22  0855  NA 141 141 142  K 3.8 4.3 3.9  CL 104 104 107  CO2 29 25 26   GLUCOSE 154* 160* 111*  BUN 13 17 12   CREATININE 0.90 0.94 0.85  CALCIUM 8.8 9.4 9.1   Liver Function Tests: Recent Labs    05/31/22 0817 09/10/22 0827 12/14/22 0855  AST 19 18 17   ALT 23 22 18   BILITOT 1.5* 1.1 1.5*  PROT 6.4 6.5 6.1   No results for input(s): "LIPASE", "AMYLASE" in the last 8760 hours. No results for input(s): "AMMONIA" in the last 8760 hours. CBC: Recent Labs    05/31/22 0817 09/10/22 0827  WBC 4.7 5.5  NEUTROABS 3,111 3,713  HGB 15.1 14.8  HCT 43.9 44.0  MCV 89.4 90.7  PLT 247 277   Lipid Panel: Recent Labs    09/10/22 0827  CHOL 104  HDL 48  LDLCALC 42  TRIG 62  CHOLHDL 2.2   TSH: No results for input(s): "TSH" in the last 8760 hours. A1C: Lab Results  Component Value Date   HGBA1C 6.8 (H) 12/14/2022     Assessment/Plan 1. Type 2 diabetes mellitus with other specified complication, without long-term current use of insulin (HCC) -A1c at goal. Will continue current regimen.  -Encouraged dietary compliance, routine foot care/monitoring and to keep up with diabetic eye exams through ophthalmology  - COMPLETE METABOLIC PANEL WITH GFR; Future - CBC with Differential/Platelet; Future - Hemoglobin A1c; Future  2. Essential hypertension -Blood pressure well controlled, goal bp <140/90 Continue current medications and dietary modifications follow metabolic panel  3. Class 1 obesity due to excess calories with serious comorbidity and body mass index (BMI) of 31.0 to 31.9 in adult -has lost weight, education provided on healthy weight loss through increase in physical activity and proper nutrition   Return in about 4 months (around 04/18/2023) for routine follow up, labs  prior .  Janene Harvey. Biagio Borg [facility] [phone]

## 2022-12-20 ENCOUNTER — Other Ambulatory Visit: Payer: Self-pay | Admitting: Interventional Cardiology

## 2022-12-20 DIAGNOSIS — E782 Mixed hyperlipidemia: Secondary | ICD-10-CM

## 2022-12-26 ENCOUNTER — Encounter: Payer: Self-pay | Admitting: Nurse Practitioner

## 2022-12-27 NOTE — Telephone Encounter (Signed)
Attempted to call patient to discuss any information about RSV vaccine

## 2023-01-03 DIAGNOSIS — L570 Actinic keratosis: Secondary | ICD-10-CM | POA: Diagnosis not present

## 2023-01-03 DIAGNOSIS — L281 Prurigo nodularis: Secondary | ICD-10-CM | POA: Diagnosis not present

## 2023-02-12 ENCOUNTER — Other Ambulatory Visit: Payer: Self-pay | Admitting: Nurse Practitioner

## 2023-02-12 DIAGNOSIS — E1169 Type 2 diabetes mellitus with other specified complication: Secondary | ICD-10-CM

## 2023-03-08 ENCOUNTER — Other Ambulatory Visit: Payer: Self-pay | Admitting: Nurse Practitioner

## 2023-03-08 DIAGNOSIS — I10 Essential (primary) hypertension: Secondary | ICD-10-CM

## 2023-04-12 ENCOUNTER — Other Ambulatory Visit: Payer: Self-pay | Admitting: *Deleted

## 2023-04-12 ENCOUNTER — Other Ambulatory Visit: Payer: Self-pay | Admitting: Orthopedic Surgery

## 2023-04-12 DIAGNOSIS — E1169 Type 2 diabetes mellitus with other specified complication: Secondary | ICD-10-CM

## 2023-04-12 DIAGNOSIS — I10 Essential (primary) hypertension: Secondary | ICD-10-CM

## 2023-04-19 ENCOUNTER — Other Ambulatory Visit: Payer: BC Managed Care – PPO

## 2023-04-19 ENCOUNTER — Encounter: Payer: Self-pay | Admitting: Nurse Practitioner

## 2023-04-19 DIAGNOSIS — E1169 Type 2 diabetes mellitus with other specified complication: Secondary | ICD-10-CM

## 2023-04-19 DIAGNOSIS — I10 Essential (primary) hypertension: Secondary | ICD-10-CM | POA: Diagnosis not present

## 2023-04-19 DIAGNOSIS — E785 Hyperlipidemia, unspecified: Secondary | ICD-10-CM | POA: Diagnosis not present

## 2023-04-20 LAB — COMPREHENSIVE METABOLIC PANEL
AG Ratio: 2.1 (calc) (ref 1.0–2.5)
ALT: 19 U/L (ref 9–46)
AST: 19 U/L (ref 10–35)
Albumin: 4.4 g/dL (ref 3.6–5.1)
Alkaline phosphatase (APISO): 68 U/L (ref 35–144)
BUN: 14 mg/dL (ref 7–25)
CO2: 28 mmol/L (ref 20–32)
Calcium: 9.7 mg/dL (ref 8.6–10.3)
Chloride: 102 mmol/L (ref 98–110)
Creat: 0.8 mg/dL (ref 0.70–1.35)
Globulin: 2.1 g/dL (ref 1.9–3.7)
Glucose, Bld: 115 mg/dL — ABNORMAL HIGH (ref 65–99)
Potassium: 4 mmol/L (ref 3.5–5.3)
Sodium: 142 mmol/L (ref 135–146)
Total Bilirubin: 1.6 mg/dL — ABNORMAL HIGH (ref 0.2–1.2)
Total Protein: 6.5 g/dL (ref 6.1–8.1)

## 2023-04-20 LAB — CBC WITH DIFFERENTIAL/PLATELET
Absolute Lymphocytes: 1002 {cells}/uL (ref 850–3900)
Absolute Monocytes: 355 {cells}/uL (ref 200–950)
Basophils Absolute: 32 {cells}/uL (ref 0–200)
Basophils Relative: 0.6 %
Eosinophils Absolute: 80 {cells}/uL (ref 15–500)
Eosinophils Relative: 1.5 %
HCT: 45.3 % (ref 38.5–50.0)
Hemoglobin: 15.1 g/dL (ref 13.2–17.1)
MCH: 30.9 pg (ref 27.0–33.0)
MCHC: 33.3 g/dL (ref 32.0–36.0)
MCV: 92.6 fL (ref 80.0–100.0)
MPV: 9.2 fL (ref 7.5–12.5)
Monocytes Relative: 6.7 %
Neutro Abs: 3832 {cells}/uL (ref 1500–7800)
Neutrophils Relative %: 72.3 %
Platelets: 271 10*3/uL (ref 140–400)
RBC: 4.89 10*6/uL (ref 4.20–5.80)
RDW: 12.5 % (ref 11.0–15.0)
Total Lymphocyte: 18.9 %
WBC: 5.3 10*3/uL (ref 3.8–10.8)

## 2023-04-20 LAB — HEMOGLOBIN A1C
Hgb A1c MFr Bld: 6.2 %{Hb} — ABNORMAL HIGH (ref <5.7)
Mean Plasma Glucose: 131 mg/dL
eAG (mmol/L): 7.3 mmol/L

## 2023-04-22 ENCOUNTER — Ambulatory Visit: Payer: BC Managed Care – PPO | Admitting: Nurse Practitioner

## 2023-04-22 ENCOUNTER — Encounter: Payer: Self-pay | Admitting: Nurse Practitioner

## 2023-04-22 VITALS — BP 130/78 | HR 89 | Temp 97.9°F | Ht 66.5 in | Wt 189.0 lb

## 2023-04-22 DIAGNOSIS — Z23 Encounter for immunization: Secondary | ICD-10-CM

## 2023-04-22 DIAGNOSIS — I4510 Unspecified right bundle-branch block: Secondary | ICD-10-CM | POA: Diagnosis not present

## 2023-04-22 DIAGNOSIS — E785 Hyperlipidemia, unspecified: Secondary | ICD-10-CM | POA: Diagnosis not present

## 2023-04-22 DIAGNOSIS — E1169 Type 2 diabetes mellitus with other specified complication: Secondary | ICD-10-CM | POA: Diagnosis not present

## 2023-04-22 DIAGNOSIS — I10 Essential (primary) hypertension: Secondary | ICD-10-CM | POA: Diagnosis not present

## 2023-04-22 NOTE — Progress Notes (Signed)
Careteam: Patient Care Team: Sharon Seller, NP as PCP - General (Geriatric Medicine) Corky Crafts, MD as PCP - Cardiology (Cardiology) Janalyn Harder, MD (Inactive) as Consulting Physician (Dermatology) Tarry Kos, MD as Attending Physician (Orthopedic Surgery) Lovenia Shuck, MD as Referring Physician (Ophthalmology) Waymon Budge, MD as Consulting Physician (Pulmonary Disease) Sheffield, Judye Bos, PA-C as Physician Assistant (Dermatology)  PLACE OF SERVICE:  [facility]  Advanced Directive information Does Patient Have a Medical Advance Directive?: Yes, Type of Advance Directive: Healthcare Power of [place];Living will, Does patient want to make changes to medical advance directive?: No - Patient declined  Allergies  Allergen Reactions   Fish Allergy Swelling    Tuna fish--- Swelling of lips     Chief Complaint  Patient presents with   Medical Management of Chronic Issues    4 month follow-up. Discuss need for diabetic kidney evaluation, covid booster, and flu vaccine. NCIR verified. Patient is requesting that his ears be checked and a cardiology recommendation      HPI: Patient is a 67 y.o. male presents for a 4 month follow-up.  He reports he is having some congestion in his ears. Feels like it's his allergies, takes xyzal daily. Denies sick contacts.   DM: doing good on the mounjaro and metformin, no side effects. CBGs run in the 110-120s. A1c down to 6.2 Watching his carbohydrate intake, has lost some weight. Walks at least 30 minutes per day, usually every day.   BP well controlled.  Hx of RBBB- follows up with cardiology, denies chest pain. His previous cardiologist moved and needs to make appt with new provider. Plans to call heartcare.   Has eye exam in December. Getting flu vaccine today.   Review of Systems:  Review of Systems  Constitutional:  Negative for chills, fever and malaise/fatigue.  HENT:  Positive for congestion (bilteral  ears).   Respiratory:  Negative for cough and shortness of breath.   Cardiovascular:  Negative for chest pain, palpitations and leg swelling.  Gastrointestinal:  Negative for constipation, diarrhea, nausea and vomiting.  Genitourinary:  Negative for dysuria, frequency and urgency.  Musculoskeletal:  Negative for joint pain and myalgias.  Neurological:  Negative for dizziness, weakness and headaches.  Psychiatric/Behavioral:  Negative for depression. The patient is not nervous/anxious and does not have insomnia.     Past Medical History:  Diagnosis Date   Allergy    Cancer (HCC)    skin cancer- squamous cell    Diabetes mellitus without complication (HCC)    Type 2   H/O lymph node excision 1998   [place] , [place]   Hypertension    Sleep apnea    wears cpap    Squamous cell carcinoma of skin 12/31/2014   RIGHT FOREARM TX = CX3 5FU   Viral pneumonia 1965   Past Surgical History:  Procedure Laterality Date   COLONOSCOPY     LYMPH NODE DISSECTION  1998   MOLE REMOVAL  07/08/2021   Basal cell carcinoma- [facility]   perirectal abcess  1989   POLYPECTOMY     SKIN BIOPSY Right 05/21/2015   forearm Mackey Birchwood PA   WISDOM TOOTH EXTRACTION     Social History:   reports that he has never smoked. He has never used smokeless tobacco. He reports current alcohol use of about 1.0 standard drink of alcohol per week. He reports that he does not use drugs.  Family History  Problem Relation Age of Onset  Hypertension Mother    Diabetes Mother 40       Diabetes type 2   Heart disease Father 28       Heart Attack   Colon cancer Neg Hx    Colon polyps Neg Hx    Esophageal cancer Neg Hx    Rectal cancer Neg Hx    Stomach cancer Neg Hx     Medications: Patient's Medications  New Prescriptions   No medications on file  Previous Medications   ASPIRIN 81 MG TABLET    Take 81 mg by mouth daily.   ATORVASTATIN (LIPITOR) 40 MG TABLET    Take 1 tablet by mouth once daily    BENAZEPRIL (LOTENSIN) 40 MG TABLET    Take 1 tablet by mouth once daily   CLOBETASOL (TEMOVATE) 0.05 % EXTERNAL SOLUTION    Apply 1 application. topically 2 (two) times daily.   DOXYCYCLINE (ADOXA) 100 MG TABLET    Take 100 mg by mouth as needed.   FLUOROURACIL (EFUDEX) 5 % CREAM    Apply to affected area qd for 2 weeks   FLUTICASONE (FLONASE) 50 MCG/ACT NASAL SPRAY    Use 2 spray(s) in each nostril once daily   GUAIFENESIN (MUCINEX) 600 MG 12 HR TABLET    Take 1,200 mg by mouth 2 (two) times daily.   HYDROCHLOROTHIAZIDE (MICROZIDE) 12.5 MG CAPSULE    Take 1 capsule by mouth once daily   KETOCONAZOLE (NIZORAL) 2 % SHAMPOO    Apply to scalp and let sit 3-5 minutes then rinse.   LEVOCETIRIZINE (XYZAL) 5 MG TABLET    Take 5 mg by mouth every evening.   METFORMIN (GLUCOPHAGE) 1000 MG TABLET    TAKE 1 TABLET BY MOUTH TWICE DAILY WITH A MEAL   PSYLLIUM (REGULOID) 0.52 G CAPSULE    Take 0.52 g by mouth daily.   TIRZEPATIDE (MOUNJARO) 5 MG/0.5ML PEN    INJECT 1 SYRINGE SUBCUTANEOUSLY ONCE A WEEK  Modified Medications   No medications on file  Discontinued Medications   No medications on file    Physical Exam:  Vitals:   04/22/23 0810  BP: 130/78  Pulse: 89  Temp: 97.9 F (36.6 C)  TempSrc: Temporal  SpO2: 97%  Weight: 85.7 kg  Height: 5' 6.5" (1.689 m)   Body mass index is 30.05 kg/m. Wt Readings from Last 3 Encounters:  04/22/23 189 lb (85.7 kg)  12/16/22 200 lb (90.7 kg)  09/13/22 211 lb (95.7 kg)    Physical Exam Constitutional:      General: He is not in acute distress.    Appearance: Normal appearance. He is not toxic-appearing.  Cardiovascular:     Rate and Rhythm: Normal rate and regular rhythm.     Pulses: Normal pulses.     Heart sounds: Normal heart sounds.  Pulmonary:     Effort: Pulmonary effort is normal.     Breath sounds: Normal breath sounds.  Abdominal:     General: Bowel sounds are normal.     Palpations: Abdomen is soft.  Musculoskeletal:         General: Normal range of motion.     Right lower leg: No edema.     Left lower leg: No edema.  Skin:    General: Skin is warm and dry.  Neurological:     General: No focal deficit present.     Mental Status: He is alert and oriented to person, place, and time. Mental status is at baseline.  Psychiatric:  Mood and Affect: Mood normal.        Behavior: Behavior normal.     Labs reviewed: Basic Metabolic Panel: Recent Labs    09/10/22 0827 12/14/22 0855 04/19/23 0807  NA 141 142 142  K 4.3 3.9 4.0  CL 104 107 102  CO2 25 26 28   GLUCOSE 160* 111* 115*  BUN 17 12 14   CREATININE 0.94 0.85 0.80  CALCIUM 9.4 9.1 9.7   Liver Function Tests: Recent Labs    09/10/22 0827 12/14/22 0855 04/19/23 0807  AST 18 17 19   ALT 22 18 19   BILITOT 1.1 1.5* 1.6*  PROT 6.5 6.1 6.5   No results for input(s): "LIPASE", "AMYLASE" in the last 8760 hours. No results for input(s): "AMMONIA" in the last 8760 hours. CBC: Recent Labs    05/31/22 0817 09/10/22 0827 04/19/23 0807  WBC 4.7 5.5 5.3  NEUTROABS 3,111 3,713 3,832  HGB 15.1 14.8 15.1  HCT 43.9 44.0 45.3  MCV 89.4 90.7 92.6  PLT 247 277 271   Lipid Panel: Recent Labs    09/10/22 0827  CHOL 104  HDL 48  LDLCALC 42  TRIG 62  CHOLHDL 2.2   TSH: No results for input(s): "TSH" in the last 8760 hours. A1C: Lab Results  Component Value Date   HGBA1C 6.2 (H) 04/19/2023     Assessment/Plan 1. Type 2 diabetes mellitus with other specified complication, without long-term current use of insulin (HCC) -A1c continues to improve, continue mounjaro and metformin -consider reduction of metformin at next visit.   -Encouraged dietary modifications (decrease sugars/carbohydrates) and physical activity  - Urine Albumin/Creatinine with ratio (send out) [LAB689] - Hemoglobin A1c; Future  2. Essential hypertension -Controlled, at goal, <140/90 -Continue benazepril and hydrochlorothiazide -Continue care with cardiology -  COMPLETE METABOLIC PANEL WITH GFR; Future - CBC with Differential/Platelet; Future  3. Hyperlipidemia associated with type 2 diabetes mellitus (HCC) -Continue atorvastatin -Lipid panel  -Encouraged dietary modifications and physical activity  - Urine Albumin/Creatinine with ratio (send out) [LAB689] - Lipid panel; Future - COMPLETE METABOLIC PANEL WITH GFR; Future  4. Right bundle branch block -Stable, Continue care with cardiology  5. Need for influenza vaccination - Flu Vaccine Trivalent High Dose (Fluad)   Return in about 4 months (around 08/20/2023) for routine follow up, labs prior .  Rollen Sox, FNP-MSN Student -I personally was present during the history, physical exam and medical decision-making activities of this service and have verified that the service and findings are accurately documented in the student's note  Abbey Chatters, NP

## 2023-04-23 LAB — MICROALBUMIN / CREATININE URINE RATIO
Creatinine, Urine: 50 mg/dL (ref 20–320)
Microalb, Ur: <0.2 mg/dL

## 2023-04-30 ENCOUNTER — Other Ambulatory Visit: Payer: Self-pay | Admitting: Nurse Practitioner

## 2023-04-30 DIAGNOSIS — J309 Allergic rhinitis, unspecified: Secondary | ICD-10-CM

## 2023-05-08 ENCOUNTER — Other Ambulatory Visit: Payer: Self-pay | Admitting: Nurse Practitioner

## 2023-05-08 DIAGNOSIS — E119 Type 2 diabetes mellitus without complications: Secondary | ICD-10-CM

## 2023-05-09 ENCOUNTER — Other Ambulatory Visit: Payer: Self-pay | Admitting: Nurse Practitioner

## 2023-05-09 DIAGNOSIS — E1169 Type 2 diabetes mellitus with other specified complication: Secondary | ICD-10-CM

## 2023-06-08 DIAGNOSIS — H2513 Age-related nuclear cataract, bilateral: Secondary | ICD-10-CM | POA: Diagnosis not present

## 2023-06-08 DIAGNOSIS — E119 Type 2 diabetes mellitus without complications: Secondary | ICD-10-CM | POA: Diagnosis not present

## 2023-06-08 DIAGNOSIS — H43393 Other vitreous opacities, bilateral: Secondary | ICD-10-CM | POA: Diagnosis not present

## 2023-06-08 DIAGNOSIS — H47093 Other disorders of optic nerve, not elsewhere classified, bilateral: Secondary | ICD-10-CM | POA: Diagnosis not present

## 2023-07-11 DIAGNOSIS — D225 Melanocytic nevi of trunk: Secondary | ICD-10-CM | POA: Diagnosis not present

## 2023-07-11 DIAGNOSIS — L821 Other seborrheic keratosis: Secondary | ICD-10-CM | POA: Diagnosis not present

## 2023-07-11 DIAGNOSIS — L814 Other melanin hyperpigmentation: Secondary | ICD-10-CM | POA: Diagnosis not present

## 2023-07-11 DIAGNOSIS — L02821 Furuncle of head [any part, except face]: Secondary | ICD-10-CM | POA: Diagnosis not present

## 2023-07-11 DIAGNOSIS — L570 Actinic keratosis: Secondary | ICD-10-CM | POA: Diagnosis not present

## 2023-08-01 ENCOUNTER — Encounter: Payer: Self-pay | Admitting: Physician Assistant

## 2023-08-01 DIAGNOSIS — I4510 Unspecified right bundle-branch block: Secondary | ICD-10-CM | POA: Insufficient documentation

## 2023-08-01 DIAGNOSIS — I2510 Atherosclerotic heart disease of native coronary artery without angina pectoris: Secondary | ICD-10-CM

## 2023-08-01 HISTORY — DX: Atherosclerotic heart disease of native coronary artery without angina pectoris: I25.10

## 2023-08-01 NOTE — Progress Notes (Deleted)
   Cardiology Office Note:    Date:  08/01/2023  ID:  [name], DOB 09-Feb-1956, MRN [id number] PCP: Sharon Seller, NP  [facility] Providers Cardiologist:  Lance Muss, MD { Click to update primary MD,subspecialty MD or APP then REFRESH:1}    {Click to Open Review  :1}   Patient Profile:      Coronary artery Ca2+ CAC Score 1/5//23:  131 (60th percentile) Right Bundle Branch Block  Diabetes mellitus  Hypertension  Hyperlipidemia  OSA on CPAP Aortic atherosclerosis      {      :1}    [name] is a 68 y.o. male who returns for follow up of coronary artery Ca2+. He was last seen by Dr. Eldridge Dace in 2/204. CHA2DS2-VASc Score =   [ ] .  Therefore, the patient's annual risk of stroke is   %.    Discussed the use of AI scribe software for clinical note transcription with the patient, who gave verbal consent to proceed.  History of Present Illness   ROS-See HPI***    Studies Reviewed:       *** Results    Risk Assessment/Calculations:   {Does this patient have ATRIAL FIBRILLATION?[phone]} No BP recorded.  {Refresh Note OR Click here to enter BP  :1}***       Physical Exam:   VS:  There were no vitals taken for this visit.   Wt Readings from Last 3 Encounters:  04/22/23 189 lb (85.7 kg)  12/16/22 200 lb (90.7 kg)  09/13/22 211 lb (95.7 kg)    Physical Exam***     Assessment and Plan:   Assessment & Plan Coronary artery calcification seen on CT scan  Essential hypertension  Hyperlipidemia associated with type 2 diabetes mellitus (HCC)  RBBB  Assessment and Plan Assessment & Plan    {      :1}    {Are you ordering a CV Procedure (e.g. stress test, cath, DCCV, TEE, etc)?   Press F2        :[phone]}  Dispo:  No follow-ups on file.  Signed, Tereso Newcomer, PA-C

## 2023-08-02 ENCOUNTER — Encounter: Payer: Self-pay | Admitting: Emergency Medicine

## 2023-08-02 ENCOUNTER — Ambulatory Visit: Payer: BC Managed Care – PPO | Attending: Physician Assistant | Admitting: Emergency Medicine

## 2023-08-02 VITALS — BP 123/78 | HR 76 | Ht 68.0 in | Wt 190.2 lb

## 2023-08-02 DIAGNOSIS — E1169 Type 2 diabetes mellitus with other specified complication: Secondary | ICD-10-CM

## 2023-08-02 DIAGNOSIS — I10 Essential (primary) hypertension: Secondary | ICD-10-CM

## 2023-08-02 DIAGNOSIS — I4510 Unspecified right bundle-branch block: Secondary | ICD-10-CM | POA: Diagnosis not present

## 2023-08-02 DIAGNOSIS — I2510 Atherosclerotic heart disease of native coronary artery without angina pectoris: Secondary | ICD-10-CM

## 2023-08-02 DIAGNOSIS — E1159 Type 2 diabetes mellitus with other circulatory complications: Secondary | ICD-10-CM

## 2023-08-02 DIAGNOSIS — G4733 Obstructive sleep apnea (adult) (pediatric): Secondary | ICD-10-CM

## 2023-08-02 DIAGNOSIS — E785 Hyperlipidemia, unspecified: Secondary | ICD-10-CM

## 2023-08-02 NOTE — Progress Notes (Signed)
Cardiology Office Note:    Date:  08/02/2023  ID:  [name], DOB 05-Sep-1955, MRN [id number] PCP: Sharon Seller, NP  [facility] Providers Cardiologist:  Donato Schultz, MD       Patient Profile:      [name] is a 68 y.o. male with visit-pertinent history of CAD, RBBB, HLD, T2DM, HTN, OSA  He established care with cardiology service on 05/2021 for evaluation of right bundle branch block.  CT cardiac scoring 06/2021 with coronary calcium score 131, (60th percentile).  Last seen in clinic by Dr. Eldridge Dace on 07/30/2022, patient was doing well at the time and no medication changes were made.      History of Present Illness:  Discussed the use of AI scribe software for clinical note transcription with the patient, who gave verbal consent to proceed.  [name] is a 68 y.o. male who returns for 1 year follow-up for CAD, RBBB.   He comes into clinic by himself today. He reports no new symptoms or changes in his health over the past year. He denies experiencing any chest pain, shortness of breath, lightheadedness, dizziness, or syncope. He continues to use his CPAP for sleep apnea without any issues. He is on hydrochlorothiazide and benazepril for hypertension, which appears to be well controlled with a blood pressure of 123/78. He also takes atorvastatin for cholesterol control and metformin for diabetes. His LDL cholesterol was 42 in March of 2024, well below the goal of 70. His most recent A1C was 6.2 in October. He maintains an active lifestyle, walking daily for 30 minutes. 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     Review of Systems  Constitutional: Negative for weight gain and weight loss.  Cardiovascular:  Negative for chest pain, claudication, dyspnea on exertion, irregular heartbeat, leg swelling, near-syncope, orthopnea, palpitations, paroxysmal nocturnal dyspnea  and syncope.  Respiratory:  Negative for cough, hemoptysis and shortness of breath.   Gastrointestinal:  Negative for abdominal pain, hematochezia and melena.  Genitourinary:  Negative for hematuria.  Neurological:  Negative for dizziness and light-headedness.     See HPI     Home Medications:    Prior to Admission medications   Medication Sig Start Date End Date Taking? Authorizing Provider  aspirin 81 MG tablet Take 81 mg by mouth daily.    [provider]  atorvastatin (LIPITOR) 40 MG tablet Take 1 tablet by mouth once daily 12/21/22   Corky Crafts, MD  benazepril (LOTENSIN) 40 MG tablet Take 1 tablet by mouth once daily 03/09/23   Sharon Seller, NP  clobetasol (TEMOVATE) 0.05 % external solution Apply 1 application. topically 2 (two) times daily. 10/21/21   Sheffield, Judye Bos, PA-C  doxycycline (ADOXA) 100 MG tablet Take 100 mg by mouth as needed.    [provider]  fluorouracil (EFUDEX) 5 % cream Apply to affected area qd for 2 weeks Patient not taking: Reported on 04/22/2023 01/01/21   Glyn Ade, PA-C  fluticasone [facility]) 50 MCG/ACT nasal spray Use 2 spray(s) in each nostril once daily 05/02/23   Sharon Seller, NP  guaiFENesin (MUCINEX) 600 MG 12 hr tablet Take 1,200 mg by mouth 2 (two) times daily.    [provider]  hydrochlorothiazide (MICROZIDE) 12.5 MG capsule Take 1 capsule by mouth once daily 10/04/22   Sharon Seller, NP  ketoconazole (NIZORAL) 2 %  shampoo Apply to scalp and let sit 3-5 minutes then rinse. 10/21/21   Sheffield, Judye Bos, PA-C  levocetirizine (XYZAL) 5 MG tablet Take 5 mg by mouth every evening.    [provider]  metFORMIN (GLUCOPHAGE) 1000 MG tablet TAKE 1 TABLET BY MOUTH TWICE DAILY WITH A MEAL 05/09/23   Sharon Seller, NP  psyllium (REGULOID) 0.52 G capsule Take 0.52 g by mouth daily.    [provider]  tirzepatide Greggory Keen) 5 MG/0.5ML Pen INJECT 1 SYRINGE SUBCUTANEOUSLY ONCE A  WEEK 05/09/23   Sharon Seller, NP   Studies Reviewed:   EKG Interpretation Date/Time:  Tuesday August 02 2023 08:06:55 EST Ventricular Rate:  76 PR Interval:  174 QRS Duration:  138 QT Interval:  404 QTC Calculation: 454 R Axis:   256  Text Interpretation: Normal sinus rhythm Right bundle branch block No previous ECGs available Confirmed by Rise Paganini [id number]) on 08/02/2023 4:29:57 PM    CT Cardiac Scoring 06/25/2021 IMPRESSION: Coronary calcium score of 131 Agatston units. This was 60th percentile for age-, race-, and sex-matched controls.  Risk Assessment/Calculations:             Physical Exam:   VS:  BP 123/78   Pulse 76   Ht 5\' 8"  (1.727 m)   Wt 190 lb 3.2 oz (86.3 kg)   SpO2 97%   BMI 28.92 kg/m    Wt Readings from Last 3 Encounters:  08/02/23 190 lb 3.2 oz (86.3 kg)  04/22/23 189 lb (85.7 kg)  12/16/22 200 lb (90.7 kg)    Constitutional:      Appearance: Normal and healthy appearance. Not in distress.  Neck:     Vascular: JVD normal.  Pulmonary:     Effort: Pulmonary effort is normal.     Breath sounds: Normal breath sounds.  Chest:     Chest wall: Not tender to palpatation.  Cardiovascular:     PMI at left midclavicular line. Normal rate. Regular rhythm. Normal S1. Normal S2.      Murmurs: There is no murmur.     No gallop.  No click. No rub.  Pulses:    Intact distal pulses.  Edema:    Peripheral edema absent.  Musculoskeletal: Normal range of motion.     Cervical back: Normal range of motion and neck supple. Skin:    General: Skin is warm and dry.  Neurological:     General: No focal deficit present.     Mental Status: Alert, oriented to person, place, and time and oriented to person, place and time.  Psychiatric:        Mood and Affect: Mood and affect normal.        Behavior: Behavior is cooperative.        Thought Content: Thought content normal.        Assessment and Plan:  Coronary artery disease / Hyperlipidemia  CT cardiac  scoring 06/2021 with CAC score 131 (60th percentile) LDL 42 on 08/2020 and under excellent control below goal of <70 EKG today showing normal sinus rhythm with chronic RBBB, HR 76 bpm with no ST or T wave changes He has been stable over the past year with no anginal symptoms, no indication for ischemic evaluation at this time -Continue Aspirin 81mg  daily and Atorvastatin 40mg  daily   Right bundle branch block Present since 2018 Asymptomatic, continue to monitor  Hypertension Blood pressure today 123/70 and under excellent control.  Under goal of less than 130/80. -Continue benazepril  40 mg daily, hydrochlorothiazide 12.5 mg daily -Encouraged to monitor BP at home  Obstructive sleep apnea No reported issues, compliant with CPAP use  T2DM A1c 6.2 on 03/2023 -Managed by PCP            Dispo: Follow-up in 1 year.  Patient is a former Dr. Eldridge Dace pt. he would like to establish care with Dr. Anne Fu.  Signed, Denyce Robert, NP

## 2023-08-02 NOTE — Patient Instructions (Signed)
Medication Instructions:  Your physician recommends that you continue on your current medications as directed. Please refer to the Current Medication list given to you today.  *If you need a refill on your cardiac medications before your next appointment, please call your pharmacy*   Lab Work: None ordered  If you have labs (blood work) drawn today and your tests are completely normal, you will receive your results only by: MyChart Message (if you have MyChart) OR A paper copy in the mail If you have any lab test that is abnormal or we need to change your treatment, we will call you to review the results.   Testing/Procedures: None ordered   Follow-Up: At [facility], you and your health needs are our priority.  As part of our continuing mission to provide you with exceptional heart care, we have created designated Provider Care Teams.  These Care Teams include your primary Cardiologist (physician) and Advanced Practice Providers (APPs -  Physician Assistants and Nurse Practitioners) who all work together to provide you with the care you need, when you need it.  We recommend signing up for the patient portal called "MyChart".  Sign up information is provided on this After Visit Summary.  MyChart is used to connect with patients for Virtual Visits (Telemedicine).  Patients are able to view lab/test results, encounter notes, upcoming appointments, etc.  Non-urgent messages can be sent to your provider as well.   To learn more about what you can do with MyChart, go to [email or web address].    Your next appointment:   12 month(s)  Provider:   Donato Schultz, MD     Other Instructions      1st Floor: - Lobby - Registration  - Pharmacy  - Lab - Cafe  2nd Floor: - PV Lab - Diagnostic Testing (echo, CT, nuclear med)  3rd Floor: - Vacant  4th Floor: - TCTS (cardiothoracic surgery) - AFib Clinic - Structural Heart Clinic - Vascular Surgery  - Vascular  Ultrasound  5th Floor: - HeartCare Cardiology (general and EP) - Clinical Pharmacy for coumadin, hypertension, lipid, weight-loss medications, and med management appointments    Valet parking services will be available as well.

## 2023-08-23 ENCOUNTER — Other Ambulatory Visit: Payer: BC Managed Care – PPO

## 2023-08-23 DIAGNOSIS — E1169 Type 2 diabetes mellitus with other specified complication: Secondary | ICD-10-CM

## 2023-08-23 DIAGNOSIS — I10 Essential (primary) hypertension: Secondary | ICD-10-CM | POA: Diagnosis not present

## 2023-08-23 DIAGNOSIS — E785 Hyperlipidemia, unspecified: Secondary | ICD-10-CM | POA: Diagnosis not present

## 2023-08-24 LAB — CBC WITH DIFFERENTIAL/PLATELET
Absolute Lymphocytes: 1190 {cells}/uL (ref 850–3900)
Absolute Monocytes: 428 {cells}/uL (ref 200–950)
Basophils Absolute: 37 {cells}/uL (ref 0–200)
Basophils Relative: 0.6 %
Eosinophils Absolute: 99 {cells}/uL (ref 15–500)
Eosinophils Relative: 1.6 %
HCT: 46 % (ref 38.5–50.0)
Hemoglobin: 15.4 g/dL (ref 13.2–17.1)
MCH: 31 pg (ref 27.0–33.0)
MCHC: 33.5 g/dL (ref 32.0–36.0)
MCV: 92.6 fL (ref 80.0–100.0)
MPV: 8.8 fL (ref 7.5–12.5)
Monocytes Relative: 6.9 %
Neutro Abs: 4445 {cells}/uL (ref 1500–7800)
Neutrophils Relative %: 71.7 %
Platelets: 267 10*3/uL (ref 140–400)
RBC: 4.97 10*6/uL (ref 4.20–5.80)
RDW: 12.2 % (ref 11.0–15.0)
Total Lymphocyte: 19.2 %
WBC: 6.2 10*3/uL (ref 3.8–10.8)

## 2023-08-24 LAB — COMPLETE METABOLIC PANEL WITH GFR
AG Ratio: 2.3 (calc) (ref 1.0–2.5)
ALT: 18 U/L (ref 9–46)
AST: 17 U/L (ref 10–35)
Albumin: 4.5 g/dL (ref 3.6–5.1)
Alkaline phosphatase (APISO): 59 U/L (ref 35–144)
BUN: 11 mg/dL (ref 7–25)
CO2: 26 mmol/L (ref 20–32)
Calcium: 9.4 mg/dL (ref 8.6–10.3)
Chloride: 105 mmol/L (ref 98–110)
Creat: 0.72 mg/dL (ref 0.70–1.35)
Globulin: 2 g/dL (ref 1.9–3.7)
Glucose, Bld: 110 mg/dL — ABNORMAL HIGH (ref 65–99)
Potassium: 4.1 mmol/L (ref 3.5–5.3)
Sodium: 142 mmol/L (ref 135–146)
Total Bilirubin: 1.5 mg/dL — ABNORMAL HIGH (ref 0.2–1.2)
Total Protein: 6.5 g/dL (ref 6.1–8.1)
eGFR: 100 mL/min/{1.73_m2} (ref >=60)

## 2023-08-24 LAB — LIPID PANEL
Cholesterol: 99 mg/dL (ref <200)
HDL: 54 mg/dL (ref >=40)
LDL Cholesterol (Calc): 32 mg/dL
Non-HDL Cholesterol (Calc): 45 mg/dL (ref <130)
Total CHOL/HDL Ratio: 1.8 (calc) (ref <5.0)
Triglycerides: 58 mg/dL (ref <150)

## 2023-08-24 LAB — HEMOGLOBIN A1C
Hgb A1c MFr Bld: 6.2 %{Hb} — ABNORMAL HIGH (ref <5.7)
Mean Plasma Glucose: 131 mg/dL
eAG (mmol/L): 7.3 mmol/L

## 2023-08-26 ENCOUNTER — Encounter: Payer: Self-pay | Admitting: Nurse Practitioner

## 2023-08-26 ENCOUNTER — Ambulatory Visit: Payer: BC Managed Care – PPO | Admitting: Nurse Practitioner

## 2023-08-26 ENCOUNTER — Ambulatory Visit
Admission: RE | Admit: 2023-08-26 | Discharge: 2023-08-26 | Disposition: A | Discharge status: Home / Self Care | Source: Ambulatory Visit | Attending: Nurse Practitioner | Admitting: Nurse Practitioner

## 2023-08-26 VITALS — BP 132/76 | HR 76 | Temp 97.6°F | Resp 16 | Ht 68.0 in | Wt 193.6 lb

## 2023-08-26 DIAGNOSIS — I10 Essential (primary) hypertension: Secondary | ICD-10-CM | POA: Diagnosis not present

## 2023-08-26 DIAGNOSIS — R0989 Other specified symptoms and signs involving the circulatory and respiratory systems: Secondary | ICD-10-CM

## 2023-08-26 DIAGNOSIS — E1169 Type 2 diabetes mellitus with other specified complication: Secondary | ICD-10-CM | POA: Diagnosis not present

## 2023-08-26 DIAGNOSIS — I4510 Unspecified right bundle-branch block: Secondary | ICD-10-CM | POA: Diagnosis not present

## 2023-08-26 DIAGNOSIS — R0689 Other abnormalities of breathing: Secondary | ICD-10-CM | POA: Diagnosis not present

## 2023-08-26 DIAGNOSIS — M7989 Other specified soft tissue disorders: Secondary | ICD-10-CM

## 2023-08-26 DIAGNOSIS — E785 Hyperlipidemia, unspecified: Secondary | ICD-10-CM

## 2023-08-26 NOTE — Progress Notes (Signed)
 Careteam: Patient Care Team: Sharon Seller, NP as PCP - General (Geriatric Medicine) Jake Bathe, MD as PCP - Cardiology (Cardiology) Janalyn Harder, MD (Inactive) as Consulting Physician (Dermatology) Tarry Kos, MD as Attending Physician (Orthopedic Surgery) Lovenia Shuck, MD as Referring Physician (Ophthalmology) Waymon Budge, MD as Consulting Physician (Pulmonary Disease) Sheffield, Judye Bos, PA-C as Physician Assistant (Dermatology)   PLACE OF SERVICE: [facility]  Advanced Directive information Does Patient Have a Medical Advance Directive?: Yes, Type of Advance Directive: Living will;Healthcare Power of Attorney, Does patient want to make changes to medical advance directive?: No - Patient declined   Allergies  Allergen Reactions   Fish Allergy Swelling    Tuna fish--- Swelling of lips      Chief Complaint  Patient presents with   Medical Management of Chronic Issues    Patient is here for a 4 month follow up and would like middle finger on right hand looked at       HPI: Patient is a 68 y.o. male presents for 4 month follow-up.  Worried he has an infection on his middle finger on right hand, reports he thinks he had some pus weeping out. Hurt his finger about 2 weeks ago scraping ice. It is swollen. Has used neosporin with a bandaid but didn't help. Hurts a little when typing or hits it on something.   Tries to watch what he eats, he is mindful of carbohydrates. Is not walking as often because of the weather, was doing 30-40 minutes per day, 5-6 days/week, but it became sporadic. Hopes to get back to walking outside with his wife once it gets warmer.   RBBB- saw cardiology, had EKG done, reports no changes were noted. Will see them again in a year.   DM- recent eye exam in Dec. 2024 -- no recent illnesses Monitors CBG daily. Continues metformin and tirzepatide; endorses some constipation 1-2 days after tirzepatide injection. Drinks plenty of water and  drinks fiber daily.   Lab work reviewed and stable.   No history of smoking. No recent upper respiratory illnesses. Denies shortness of breath, cough or congestion.  Review of Systems:  Review of Systems  Constitutional:  Negative for chills, fever and weight loss.  Respiratory:  Negative for cough and shortness of breath.   Cardiovascular:  Negative for chest pain, palpitations and leg swelling.  Gastrointestinal:  Positive for constipation (1-2 days after tirzepatide). Negative for blood in stool, diarrhea, nausea and vomiting.  Genitourinary:  Negative for dysuria, frequency, hematuria and urgency.  Musculoskeletal:  Negative for joint pain and myalgias.  Neurological:  Negative for dizziness, weakness and headaches.  Psychiatric/Behavioral:  Negative for depression. The patient is not nervous/anxious and does not have insomnia.     Past Medical History:  Diagnosis Date   Allergy    Cancer (HCC)    skin cancer- squamous cell    Coronary artery calcification seen on CT scan 08/01/2023   CAC Score 1/5//23:  131 (60th percentile)    Diabetes mellitus without complication (HCC)    Type 2   H/O lymph node excision 1998   [place] , [place]   Hypertension    Sleep apnea    wears cpap    Squamous cell carcinoma of skin 12/31/2014   RIGHT FOREARM TX = CX3 5FU   Viral pneumonia 1965    Past Surgical History:  Procedure Laterality Date   COLONOSCOPY     LYMPH NODE DISSECTION  1998   MOLE  REMOVAL  07/08/2021   Basal cell carcinoma- [facility]   perirectal abcess  1989   POLYPECTOMY     SKIN BIOPSY Right 05/21/2015   forearm Mackey Birchwood PA   WISDOM TOOTH EXTRACTION       Social History:   reports that he has never smoked. He has never used smokeless tobacco. He reports current alcohol use of about 1.0 standard drink of alcohol per week. He reports that he does not use drugs.  Family History  Problem Relation Age of Onset   Hypertension Mother    Diabetes Mother 34        Diabetes type 2   Heart disease Father 32       Heart Attack   Colon cancer Neg Hx    Colon polyps Neg Hx    Esophageal cancer Neg Hx    Rectal cancer Neg Hx    Stomach cancer Neg Hx      Medications:  Patient's Medications  New Prescriptions   No medications on file  Previous Medications   ASPIRIN 81 MG TABLET    Take 81 mg by mouth daily.   ATORVASTATIN (LIPITOR) 40 MG TABLET    Take 1 tablet by mouth once daily   BENAZEPRIL (LOTENSIN) 40 MG TABLET    Take 1 tablet by mouth once daily   CLOBETASOL (TEMOVATE) 0.05 % EXTERNAL SOLUTION    Apply 1 application. topically 2 (two) times daily.   DOXYCYCLINE (ADOXA) 100 MG TABLET    Take 100 mg by mouth as needed.   FLUOROURACIL (EFUDEX) 5 % CREAM    Apply to affected area qd for 2 weeks   FLUTICASONE (FLONASE) 50 MCG/ACT NASAL SPRAY    Use 2 spray(s) in each nostril once daily   GUAIFENESIN (MUCINEX) 600 MG 12 HR TABLET    Take 1,200 mg by mouth 2 (two) times daily.   HYDROCHLOROTHIAZIDE (MICROZIDE) 12.5 MG CAPSULE    Take 1 capsule by mouth once daily   KETOCONAZOLE (NIZORAL) 2 % SHAMPOO    Apply to scalp and let sit 3-5 minutes then rinse.   LEVOCETIRIZINE (XYZAL) 5 MG TABLET    Take 5 mg by mouth every evening.   METFORMIN (GLUCOPHAGE) 1000 MG TABLET    TAKE 1 TABLET BY MOUTH TWICE DAILY WITH A MEAL   PSYLLIUM (REGULOID) 0.52 G CAPSULE    Take 0.52 g by mouth daily.   TIRZEPATIDE (MOUNJARO) 5 MG/0.5ML PEN    INJECT 1 SYRINGE SUBCUTANEOUSLY ONCE A WEEK  Modified Medications   No medications on file  Discontinued Medications   No medications on file     Physical Exam:  Vitals:   08/26/23 0826  BP: 132/76  Pulse: 76  Resp: 16  Temp: 97.6 F (36.4 C)  TempSrc: Temporal  SpO2: 98%  Weight: 193 lb 9.6 oz (87.8 kg)  Height: 5\' 8"  (1.727 m)   Body mass index is 29.44 kg/m.  Wt Readings from Last 3 Encounters:  08/26/23 193 lb 9.6 oz (87.8 kg)  08/02/23 190 lb 3.2 oz (86.3 kg)  04/22/23 189 lb (85.7 kg)     Physical  Exam Constitutional:      General: He is not in acute distress.    Appearance: Normal appearance. He is not ill-appearing.  Cardiovascular:     Rate and Rhythm: Normal rate and regular rhythm.     Pulses: Normal pulses.     Heart sounds: Normal heart sounds.  Pulmonary:     Effort: Pulmonary  effort is normal.     Breath sounds: Wheezing (right posterior upper lobe and left lower lobe) present.  Abdominal:     General: Bowel sounds are normal.     Palpations: Abdomen is soft.  Musculoskeletal:        General: Normal range of motion.  Feet:     Right foot:     Toenail Condition: Fungal disease present.    Left foot:     Toenail Condition: Fungal disease present. Skin:    General: Skin is warm and dry.     Findings: Erythema (distal portion of middle finger, right hand) present.  Neurological:     General: No focal deficit present.     Mental Status: He is alert and oriented to person, place, and time. Mental status is at baseline.  Psychiatric:        Mood and Affect: Mood normal.        Behavior: Behavior normal.     Labs reviewed: Basic Metabolic Panel:  Recent Labs    12/14/22 0855 04/19/23 0807 08/23/23 0810  NA 142 142 142  K 3.9 4.0 4.1  CL 107 102 105  CO2 26 28 26   GLUCOSE 111* 115* 110*  BUN 12 14 11   CREATININE 0.85 0.80 0.72  CALCIUM 9.1 9.7 9.4   Liver Function Tests:  Recent Labs    12/14/22 0855 04/19/23 0807 08/23/23 0810  AST 17 19 17   ALT 18 19 18   BILITOT 1.5* 1.6* 1.5*  PROT 6.1 6.5 6.5   No results for input(s): "LIPASE", "AMYLASE" in the last 8760 hours. No results for input(s): "AMMONIA" in the last 8760 hours. CBC:  Recent Labs    09/10/22 0827 04/19/23 0807 08/23/23 0810  WBC 5.5 5.3 6.2  NEUTROABS 3,713 3,832 4,445  HGB 14.8 15.1 15.4  HCT 44.0 45.3 46.0  MCV 90.7 92.6 92.6  PLT 277 271 267   Lipid Panel:  Recent Labs    09/10/22 0827 08/23/23 0810  CHOL 104 99  HDL 48 54  LDLCALC 42 32  TRIG 62 58  CHOLHDL 2.2 1.8    TSH: No results for input(s): "TSH" in the last 8760 hours. A1C:  Lab Results  Component Value Date   HGBA1C 6.2 (H) 08/23/2023     Assessment/Plan   1. Type 2 diabetes mellitus with other specified complication, without long-term current use of insulin (HCC) (Primary) -Controlled, A1c at goal -Continue current medications -Encouraged dietary modifications and physical activity as tolerated -Encouraged routine eye care, foot care (encouraged to keep nails properly trimmed)  2. Essential hypertension -Controlled, at goal, <140/90 -Continue current medications  -Encouraged dietary modifications and physical activity as tolerated  3. Hyperlipidemia associated with type 2 diabetes mellitus (HCC) -Encouraged dietary modifications and physical activity as tolerated -Continue atorvastatin   4. Right bundle branch block -Stable, NSR with RBBB on EKG by cardiology -Will return in a year to cardiology    5. Abnormal lung sounds -Wheezing noted on auscultation; denies shortness of breath, no recent history of URI, no history of asthma/copd, not a smoker - DG Chest 2 View; Future  6. Swelling of right middle finger -Had minor injury, no drainage on assessment today, mild redness and swelling noted -Soak in warm water with Epson salt three times a day for a week -After 1 week, notify if no improvements noted   Return in about 6 months (around 02/26/2024) for CPE, labs prior to visit.  Maryruth Bun MSN-FNP Student -I  personally was present during the history, physical exam and medical decision-making activities of this service and have verified that the service and findings are accurately documented in the student's note Flonnie Wierman K. [facility] [phone]

## 2023-08-30 ENCOUNTER — Encounter: Payer: Self-pay | Admitting: Nurse Practitioner

## 2023-09-05 ENCOUNTER — Encounter: Payer: Self-pay | Admitting: Nurse Practitioner

## 2023-09-05 MED ORDER — DOXYCYCLINE HYCLATE 100 MG PO TABS
100.0000 mg | ORAL_TABLET | Freq: Two times a day (BID) | ORAL | 0 refills | Status: DC
Start: 2023-09-05 — End: 2023-09-16

## 2023-09-05 NOTE — Telephone Encounter (Signed)
Message routed to PCP Eubanks, Jessica K, NP  

## 2023-09-10 ENCOUNTER — Other Ambulatory Visit: Payer: Self-pay | Admitting: Interventional Cardiology

## 2023-09-10 DIAGNOSIS — E782 Mixed hyperlipidemia: Secondary | ICD-10-CM

## 2023-09-12 NOTE — Telephone Encounter (Signed)
Message routed to PCP Eubanks, Jessica K, NP  

## 2023-09-16 ENCOUNTER — Ambulatory Visit: Admitting: Nurse Practitioner

## 2023-09-16 ENCOUNTER — Encounter: Payer: Self-pay | Admitting: Nurse Practitioner

## 2023-09-16 VITALS — BP 125/78 | HR 75 | Temp 97.9°F | Resp 18 | Ht 68.0 in | Wt 194.4 lb

## 2023-09-16 DIAGNOSIS — L600 Ingrowing nail: Secondary | ICD-10-CM | POA: Diagnosis not present

## 2023-09-16 MED ORDER — DOXYCYCLINE HYCLATE 100 MG PO TABS: 100.0000 mg | ORAL_TABLET | Freq: Two times a day (BID) | ORAL | 0 refills | Status: DC

## 2023-09-16 NOTE — Progress Notes (Signed)
 Careteam: Patient Care Team: Sharon Seller, NP as PCP - General (Geriatric Medicine) Jake Bathe, MD as PCP - Cardiology (Cardiology) Janalyn Harder, MD (Inactive) as Consulting Physician (Dermatology) Tarry Kos, MD as Attending Physician (Orthopedic Surgery) Lovenia Shuck, MD as Referring Physician (Ophthalmology) Waymon Budge, MD as Consulting Physician (Pulmonary Disease) Sheffield, Judye Bos, PA-C as Physician Assistant (Dermatology)   PLACE [facility] SERVICE: [facility]  Advanced Directive information Does Patient Have a Medical Advance Directive?: Yes, Type of Advance Directive: Living will;Healthcare Power of Attorney, Does patient want to make changes to medical advance directive?: No - Patient declined   Allergies  Allergen Reactions   Fish Allergy Swelling    Tuna fish--- Swelling of lips      Chief Complaint  Patient presents with   Medical Management of Chronic Issues    re-evaluation of the right Middle Finger      HPI: Patient is a 68 y.o. male for acute visit. Patient reports ongoing mild redness, mild swelling, and mild tenderness of right middle finger. No drainage. No limited movement to finger.  Completed doxycycline 4 days ago and Epson salt soaks for a week, patient reports improvement in symptoms but did not resolve.   Review of Systems:  Review of Systems  Constitutional:  Negative for chills and fever.  Skin:        Redness/swelling to right middle finger  Neurological:  Negative for sensory change.     Past Medical History:  Diagnosis Date   Allergy    Cancer (HCC)    skin cancer- squamous cell    Coronary artery calcification seen on CT scan 08/01/2023   CAC Score 1/5//23:  131 (60th percentile)    Diabetes mellitus without complication (HCC)    Type 2   H/O lymph node excision 1998   [place] , [place]   Hypertension    Sleep apnea    wears cpap    Squamous cell carcinoma of skin 12/31/2014   RIGHT FOREARM TX = CX3 5FU   Viral  pneumonia 1965    Past Surgical History:  Procedure Laterality Date   COLONOSCOPY     LYMPH NODE DISSECTION  1998   MOLE REMOVAL  07/08/2021   Basal cell carcinoma- [facility]   perirectal abcess  1989   POLYPECTOMY     SKIN BIOPSY Right 05/21/2015   forearm Mackey Birchwood PA   WISDOM TOOTH EXTRACTION       Social History:   reports that he has never smoked. He has never used smokeless tobacco. He reports current alcohol use of about 1.0 standard drink of alcohol per week. He reports that he does not use drugs.  Family History  Problem Relation Age of Onset   Hypertension Mother    Diabetes Mother 66       Diabetes type 2   Heart disease Father 69       Heart Attack   Colon cancer Neg Hx    Colon polyps Neg Hx    Esophageal cancer Neg Hx    Rectal cancer Neg Hx    Stomach cancer Neg Hx      Medications:  Patient's Medications  New Prescriptions   DOXYCYCLINE (VIBRA-TABS) 100 MG TABLET    Take 1 tablet (100 mg total) by mouth 2 (two) times daily.  Previous Medications   ASPIRIN 81 MG TABLET    Take 81 mg by mouth daily.   ATORVASTATIN (LIPITOR) 40 MG TABLET  Take 1 tablet by mouth once daily   BENAZEPRIL (LOTENSIN) 40 MG TABLET    Take 1 tablet by mouth once daily   CLOBETASOL (TEMOVATE) 0.05 % EXTERNAL SOLUTION    Apply 1 application. topically 2 (two) times daily.   DOXYCYCLINE (ADOXA) 100 MG TABLET    Take 100 mg by mouth as needed.   FLUOROURACIL (EFUDEX) 5 % CREAM    Apply to affected area qd for 2 weeks   FLUTICASONE (FLONASE) 50 MCG/ACT NASAL SPRAY    Use 2 spray(s) in each nostril once daily   GUAIFENESIN (MUCINEX) 600 MG 12 HR TABLET    Take 1,200 mg by mouth 2 (two) times daily.   HYDROCHLOROTHIAZIDE (MICROZIDE) 12.5 MG CAPSULE    Take 1 capsule by mouth once daily   KETOCONAZOLE (NIZORAL) 2 % SHAMPOO    Apply to scalp and let sit 3-5 minutes then rinse.   LEVOCETIRIZINE (XYZAL) 5 MG TABLET    Take 5 mg by mouth every evening.   METFORMIN (GLUCOPHAGE)  1000 MG TABLET    TAKE 1 TABLET BY MOUTH TWICE DAILY WITH A MEAL   PSYLLIUM (REGULOID) 0.52 G CAPSULE    Take 0.52 g by mouth daily.   TIRZEPATIDE (MOUNJARO) 5 MG/0.5ML PEN    INJECT 1 SYRINGE SUBCUTANEOUSLY ONCE A WEEK  Modified Medications   No medications on file  Discontinued Medications   DOXYCYCLINE (VIBRA-TABS) 100 MG TABLET    Take 1 tablet (100 mg total) by mouth 2 (two) times daily.     Physical Exam:  Vitals:   09/16/23 1054  BP: 125/78  Pulse: 75  Resp: 18  Temp: 97.9 F (36.6 C)  SpO2: 96%  Weight: 194 lb 6.4 oz (88.2 kg)  Height: 5\' 8"  (1.727 m)   Body mass index is 29.56 kg/m.  Wt Readings from Last 3 Encounters:  09/16/23 194 lb 6.4 oz (88.2 kg)  08/26/23 193 lb 9.6 oz (87.8 kg)  08/02/23 190 lb 3.2 oz (86.3 kg)     Physical Exam Constitutional:      General: He is not in acute distress.    Appearance: He is not ill-appearing or toxic-appearing.  Cardiovascular:     Rate and Rhythm: Normal rate and regular rhythm.     Pulses: Normal pulses.     Heart sounds: Normal heart sounds.  Pulmonary:     Effort: Pulmonary effort is normal.     Breath sounds: Normal breath sounds.  Skin:    Findings: Erythema (right middle finger with mild swelling and induration) present.  Neurological:     General: No focal deficit present.     Mental Status: He is alert and oriented to person, place, and time. Mental status is at baseline.  Psychiatric:        Mood and Affect: Mood normal.        Behavior: Behavior normal.      Labs reviewed: Basic Metabolic Panel:  Recent Labs    12/14/22 0855 04/19/23 0807 08/23/23 0810  NA 142 142 142  K 3.9 4.0 4.1  CL 107 102 105  CO2 26 28 26   GLUCOSE 111* 115* 110*  BUN 12 14 11   CREATININE 0.85 0.80 0.72  CALCIUM 9.1 9.7 9.4   Liver Function Tests:  Recent Labs    12/14/22 0855 04/19/23 0807 08/23/23 0810  AST 17 19 17   ALT 18 19 18   BILITOT 1.5* 1.6* 1.5*  PROT 6.1 6.5 6.5   No results for input(s): "LIPASE",  "  AMYLASE" in the last 8760 hours. No results for input(s): "AMMONIA" in the last 8760 hours. CBC:  Recent Labs    04/19/23 0807 08/23/23 0810  WBC 5.3 6.2  NEUTROABS 3,832 4,445  HGB 15.1 15.4  HCT 45.3 46.0  MCV 92.6 92.6  PLT 271 267   Lipid Panel:  Recent Labs    08/23/23 0810  CHOL 99  HDL 54  LDLCALC 32  TRIG 58  CHOLHDL 1.8   TSH: No results for input(s): "TSH" in the last 8760 hours. A1C:  Lab Results  Component Value Date   HGBA1C 6.2 (H) 08/23/2023     Assessment/Plan   1. Ingrown fingernail (Primary) - Mild swelling, redness, and induration to right middle finger, with some improvement noted from last visit -Continue doxycycline for additional 7 days -doxycycline (VIBRA-TABS) 100 MG tablet; Take 1 tablet (100 mg total) by mouth 2 (two) times daily.  Dispense: 14 tablet; Refill: 0 -Continue Epson salt soaks -Notify if worsening symptoms  Rollen Sox, [employer] MSN-FNP Student -I personally was present during the history, physical exam and medical decision-making activities of this service and have verified that the service and findings are accurately documented in the student's note Amberlea Spagnuolo K. [facility] [phone]

## 2023-09-24 ENCOUNTER — Other Ambulatory Visit: Payer: Self-pay | Admitting: Nurse Practitioner

## 2023-09-24 DIAGNOSIS — E1169 Type 2 diabetes mellitus with other specified complication: Secondary | ICD-10-CM

## 2023-09-27 ENCOUNTER — Other Ambulatory Visit: Payer: Self-pay | Admitting: Nurse Practitioner

## 2023-09-27 DIAGNOSIS — E1169 Type 2 diabetes mellitus with other specified complication: Secondary | ICD-10-CM

## 2023-11-19 ENCOUNTER — Other Ambulatory Visit: Payer: Self-pay | Admitting: Nurse Practitioner

## 2023-11-19 DIAGNOSIS — I10 Essential (primary) hypertension: Secondary | ICD-10-CM

## 2024-01-09 DIAGNOSIS — D225 Melanocytic nevi of trunk: Secondary | ICD-10-CM | POA: Diagnosis not present

## 2024-01-09 DIAGNOSIS — L578 Other skin changes due to chronic exposure to nonionizing radiation: Secondary | ICD-10-CM | POA: Diagnosis not present

## 2024-01-09 DIAGNOSIS — L218 Other seborrheic dermatitis: Secondary | ICD-10-CM | POA: Diagnosis not present

## 2024-01-09 DIAGNOSIS — L570 Actinic keratosis: Secondary | ICD-10-CM | POA: Diagnosis not present

## 2024-01-09 DIAGNOSIS — L814 Other melanin hyperpigmentation: Secondary | ICD-10-CM | POA: Diagnosis not present

## 2024-02-06 ENCOUNTER — Other Ambulatory Visit: Payer: Self-pay | Admitting: Nurse Practitioner

## 2024-02-06 DIAGNOSIS — E1169 Type 2 diabetes mellitus with other specified complication: Secondary | ICD-10-CM

## 2024-02-20 ENCOUNTER — Other Ambulatory Visit: Payer: Self-pay | Admitting: Nurse Practitioner

## 2024-02-20 DIAGNOSIS — I10 Essential (primary) hypertension: Secondary | ICD-10-CM

## 2024-02-28 ENCOUNTER — Other Ambulatory Visit: Payer: Self-pay

## 2024-02-28 ENCOUNTER — Other Ambulatory Visit

## 2024-02-28 DIAGNOSIS — E1169 Type 2 diabetes mellitus with other specified complication: Secondary | ICD-10-CM | POA: Diagnosis not present

## 2024-02-28 DIAGNOSIS — I10 Essential (primary) hypertension: Secondary | ICD-10-CM | POA: Diagnosis not present

## 2024-02-28 DIAGNOSIS — E785 Hyperlipidemia, unspecified: Secondary | ICD-10-CM | POA: Diagnosis not present

## 2024-02-29 LAB — COMPREHENSIVE METABOLIC PANEL WITH GFR
AG Ratio: 2.3 (calc) (ref 1.0–2.5)
ALT: 19 U/L (ref 9–46)
AST: 16 U/L (ref 10–35)
Albumin: 4.3 g/dL (ref 3.6–5.1)
Alkaline phosphatase (APISO): 54 U/L (ref 35–144)
BUN: 11 mg/dL (ref 7–25)
CO2: 30 mmol/L (ref 20–32)
Calcium: 9.4 mg/dL (ref 8.6–10.3)
Chloride: 103 mmol/L (ref 98–110)
Creat: 0.73 mg/dL (ref 0.70–1.35)
Globulin: 1.9 g/dL (ref 1.9–3.7)
Glucose, Bld: 124 mg/dL — ABNORMAL HIGH (ref 65–99)
Potassium: 4.1 mmol/L (ref 3.5–5.3)
Sodium: 141 mmol/L (ref 135–146)
Total Bilirubin: 1.5 mg/dL — ABNORMAL HIGH (ref 0.2–1.2)
Total Protein: 6.2 g/dL (ref 6.1–8.1)
eGFR: 99 mL/min/1.73m2 (ref >=60)

## 2024-02-29 LAB — HEMOGLOBIN A1C
Hgb A1c MFr Bld: 6.4 % — ABNORMAL HIGH (ref <5.7)
Mean Plasma Glucose: 137 mg/dL
eAG (mmol/L): 7.6 mmol/L

## 2024-02-29 LAB — CBC WITH DIFFERENTIAL/PLATELET
Absolute Lymphocytes: 1113 {cells}/uL (ref 850–3900)
Absolute Monocytes: 466 {cells}/uL (ref 200–950)
Basophils Absolute: 32 {cells}/uL (ref 0–200)
Basophils Relative: 0.6 %
Eosinophils Absolute: 111 {cells}/uL (ref 15–500)
Eosinophils Relative: 2.1 %
HCT: 45.8 % (ref 38.5–50.0)
Hemoglobin: 15.4 g/dL (ref 13.2–17.1)
MCH: 31.2 pg (ref 27.0–33.0)
MCHC: 33.6 g/dL (ref 32.0–36.0)
MCV: 92.9 fL (ref 80.0–100.0)
MPV: 9.2 fL (ref 7.5–12.5)
Monocytes Relative: 8.8 %
Neutro Abs: 3578 {cells}/uL (ref 1500–7800)
Neutrophils Relative %: 67.5 %
Platelets: 253 Thousand/uL (ref 140–400)
RBC: 4.93 Million/uL (ref 4.20–5.80)
RDW: 12.3 % (ref 11.0–15.0)
Total Lymphocyte: 21 %
WBC: 5.3 Thousand/uL (ref 3.8–10.8)

## 2024-02-29 LAB — LIPID PANEL
Cholesterol: 101 mg/dL (ref <200)
HDL: 51 mg/dL (ref >=40)
LDL Cholesterol (Calc): 36 mg/dL
Non-HDL Cholesterol (Calc): 50 mg/dL (ref <130)
Total CHOL/HDL Ratio: 2 (calc) (ref <5.0)
Triglycerides: 67 mg/dL (ref <150)

## 2024-03-02 ENCOUNTER — Ambulatory Visit (INDEPENDENT_AMBULATORY_CARE_PROVIDER_SITE_OTHER): Admitting: Nurse Practitioner

## 2024-03-02 ENCOUNTER — Encounter: Payer: Self-pay | Admitting: Nurse Practitioner

## 2024-03-02 VITALS — BP 146/98 | HR 76 | Temp 97.2°F | Resp 16 | Ht 68.0 in | Wt 199.4 lb

## 2024-03-02 DIAGNOSIS — E1169 Type 2 diabetes mellitus with other specified complication: Secondary | ICD-10-CM

## 2024-03-02 DIAGNOSIS — J309 Allergic rhinitis, unspecified: Secondary | ICD-10-CM

## 2024-03-02 DIAGNOSIS — I10 Essential (primary) hypertension: Secondary | ICD-10-CM | POA: Diagnosis not present

## 2024-03-02 DIAGNOSIS — Z23 Encounter for immunization: Secondary | ICD-10-CM

## 2024-03-02 DIAGNOSIS — E785 Hyperlipidemia, unspecified: Secondary | ICD-10-CM

## 2024-03-02 MED ORDER — COVID-19 MRNA VAC-TRIS(PFIZER) 30 MCG/0.3ML IM SUSY: 0.3000 mL | PREFILLED_SYRINGE | Freq: Once | INTRAMUSCULAR | 0 refills | Status: AC

## 2024-03-02 NOTE — Patient Instructions (Signed)
 To get RSV and COVID booster at pharmacy   To cut out sodium as much as possible Look up the salty 6  Check blood pressure at home and record Sent me the readings in 2-3 weeks Make sure you have taken medication 1 hour prior and have been sitting at least 5 mins   Call and make appt for colonoscopy at [place] [facility]

## 2024-03-02 NOTE — Progress Notes (Signed)
 Careteam: Patient Care Team: Caro Harlene POUR, NP as PCP - General (Geriatric Medicine) Jeffrie Oneil BROCKS, MD as PCP - Cardiology (Cardiology) Livingston Rigg, MD as Consulting Physician (Dermatology) Jerri Kay HERO, MD as Attending Physician (Orthopedic Surgery) Wilma Ozell BRAVO, MD as Referring Physician (Ophthalmology) Neysa Reggy BIRCH, MD as Consulting Physician (Pulmonary Disease) Sheffield, Andrez SAUNDERS, PA-C (Inactive) as Physician Assistant (Dermatology)  PLACE OF SERVICE:  [facility]  Advanced Directive information Does Patient Have a Medical Advance Directive?: Yes, Type of Advance Directive: Healthcare Power of [place];Living will, Does patient want to make changes to medical advance directive?: No - Patient declined  Allergies  Allergen Reactions   Fish Allergy Swelling    Tuna fish--- Swelling of lips     Chief Complaint  Patient presents with   Medical Management of Chronic Issues    6 month follow up. Discuss the need for Influenza vaccine, Covid Booster, and Colonoscopy due soon in November 2025.     HPI:  Discussed the use of AI scribe software for clinical note transcription with the patient, who gave verbal consent to proceed.  History of Present Illness [name] is a 68 year old male who presents for a routine follow-up visit.  He has no new concerns since his last visit and has been following up with a dermatologist for the past two years.  Recent blood work shows a slight increase in A1c, attributed to decreased physical activity over the summer. He acknowledges a slight weight gain. He is currently taking aspirin, Lipitor 40 mg, benazepril , hydrochlorothiazide , Mounjaro  5 mg weekly, and metformin  1000 mg twice daily.   He has not been routinely checking his blood pressure at home but notes it has not been an issue in the past.  He routinely takes guaifenesin (Mucinex) for sinus congestion, which he describes as 'getting all clogged'. He also takes  Xyzal for sinus issues, which helps keep symptoms at bay. No shortness of breath, chest pain, palpitations, swelling, changes in bowel habits, or urination, except for typically getting up once a night to urinate.  He has a history of slightly elevated bilirubin levels for a few years, but other liver enzymes are normal. Kidney function, electrolytes, and blood counts are normal. Results reviewed with pt    Review of Systems:  Review of Systems  Constitutional:  Negative for chills, fever and weight loss.  HENT:  Negative for tinnitus.   Respiratory:  Negative for cough, sputum production and shortness of breath.   Cardiovascular:  Negative for chest pain, palpitations and leg swelling.  Gastrointestinal:  Negative for abdominal pain, constipation, diarrhea and heartburn.  Genitourinary:  Negative for dysuria, frequency and urgency.  Musculoskeletal:  Negative for back pain, falls, joint pain and myalgias.  Skin: Negative.   Neurological:  Negative for dizziness and headaches.  Psychiatric/Behavioral:  Negative for depression and memory loss. The patient does not have insomnia.     Past Medical History:  Diagnosis Date   Allergy    Cancer (HCC)    skin cancer- squamous cell    Coronary artery calcification seen on CT scan 08/01/2023   CAC Score 1/5//23:  131 (60th percentile)    Diabetes mellitus without complication (HCC)    Type 2   H/O lymph node excision 1998   [place] , [place]   Hypertension    Sleep apnea    wears cpap    Squamous cell carcinoma of skin 12/31/2014   RIGHT FOREARM TX = CX3 5FU  Viral pneumonia 1965   Past Surgical History:  Procedure Laterality Date   COLONOSCOPY     LYMPH NODE DISSECTION  1998   MOLE REMOVAL  07/08/2021   Basal cell carcinoma- [facility]   perirectal abcess  1989   POLYPECTOMY     SKIN BIOPSY Right 05/21/2015   forearm Andrez Castle PA   WISDOM TOOTH EXTRACTION     Social History:   reports that he has never smoked. He  has never used smokeless tobacco. He reports current alcohol use of about 1.0 standard drink of alcohol per week. He reports that he does not use drugs.  Family History  Problem Relation Age of Onset   Hypertension Mother    Diabetes Mother 10       Diabetes type 2   Heart disease Father 51       Heart Attack   Colon cancer Neg Hx    Colon polyps Neg Hx    Esophageal cancer Neg Hx    Rectal cancer Neg Hx    Stomach cancer Neg Hx     Medications: Patient's Medications  New Prescriptions   No medications on file  Previous Medications   ASPIRIN 81 MG TABLET    Take 81 mg by mouth daily.   ATORVASTATIN  (LIPITOR) 40 MG TABLET    Take 1 tablet by mouth once daily   BENAZEPRIL  (LOTENSIN ) 40 MG TABLET    Take 1 tablet by mouth once daily   CLOBETASOL  (TEMOVATE ) 0.05 % EXTERNAL SOLUTION    Apply 1 application. topically 2 (two) times daily.   DOXYCYCLINE  (ADOXA) 100 MG TABLET    Take 100 mg by mouth as needed.   DOXYCYCLINE  (VIBRA -TABS) 100 MG TABLET    Take 1 tablet (100 mg total) by mouth 2 (two) times daily.   FLUOROURACIL  (EFUDEX ) 5 % CREAM    Apply to affected area qd for 2 weeks   FLUTICASONE  (FLONASE ) 50 MCG/ACT NASAL SPRAY    Use 2 spray(s) in each nostril once daily   GUAIFENESIN (MUCINEX) 600 MG 12 HR TABLET    Take 1,200 mg by mouth 2 (two) times daily.   HYDROCHLOROTHIAZIDE  (MICROZIDE ) 12.5 MG CAPSULE    Take 1 capsule by mouth once daily   KETOCONAZOLE  (NIZORAL ) 2 % SHAMPOO    Apply to scalp and let sit 3-5 minutes then rinse.   LEVOCETIRIZINE (XYZAL) 5 MG TABLET    Take 5 mg by mouth every evening.   METFORMIN  (GLUCOPHAGE ) 1000 MG TABLET    TAKE 1 TABLET BY MOUTH TWICE DAILY WITH A MEAL   PSYLLIUM (REGULOID) 0.52 G CAPSULE    Take 0.52 g by mouth daily.   TIRZEPATIDE  (MOUNJARO ) 5 MG/0.5ML PEN    INJECT 1 PEN SUBCUTANEOUSLY ONCE A WEEK  Modified Medications   No medications on file  Discontinued Medications   No medications on file    Physical Exam:  Vitals:    03/02/24 0900  BP: (!) 146/98  Pulse: 76  Resp: 16  Temp: (!) 97.2 F (36.2 C)  SpO2: 97%  Weight: 199 lb 6.4 oz (90.4 kg)  Height: 5' 8 (1.727 m)   Body mass index is 30.32 kg/m. Wt Readings from Last 3 Encounters:  03/02/24 199 lb 6.4 oz (90.4 kg)  09/16/23 194 lb 6.4 oz (88.2 kg)  08/26/23 193 lb 9.6 oz (87.8 kg)    Physical Exam Constitutional:      General: He is not in acute distress.    Appearance: He  is well-developed. He is not diaphoretic.  HENT:     Head: Normocephalic and atraumatic.     Right Ear: External ear normal.     Left Ear: External ear normal.     Mouth/Throat:     Pharynx: No oropharyngeal exudate.  Eyes:     Conjunctiva/sclera: Conjunctivae normal.     Pupils: Pupils are equal, round, and reactive to light.  Cardiovascular:     Rate and Rhythm: Normal rate and regular rhythm.     Heart sounds: Normal heart sounds.  Pulmonary:     Effort: Pulmonary effort is normal.     Breath sounds: Normal breath sounds.  Abdominal:     General: Bowel sounds are normal.     Palpations: Abdomen is soft.  Musculoskeletal:        General: No tenderness.     Cervical back: Normal range of motion and neck supple.     Right lower leg: No edema.     Left lower leg: No edema.  Skin:    General: Skin is warm and dry.  Neurological:     Mental Status: He is alert and oriented to person, place, and time.     Labs reviewed: Basic Metabolic Panel: Recent Labs    04/19/23 0807 08/23/23 0810 02/28/24 0824  NA 142 142 141  K 4.0 4.1 4.1  CL 102 105 103  CO2 28 26 30   GLUCOSE 115* 110* 124*  BUN 14 11 11   CREATININE 0.80 0.72 0.73  CALCIUM  9.7 9.4 9.4   Liver Function Tests: Recent Labs    04/19/23 0807 08/23/23 0810 02/28/24 0824  AST 19 17 16   ALT 19 18 19   BILITOT 1.6* 1.5* 1.5*  PROT 6.5 6.5 6.2   No results for input(s): LIPASE, AMYLASE in the last 8760 hours. No results for input(s): AMMONIA in the last 8760 hours. CBC: Recent Labs     04/19/23 0807 08/23/23 0810 02/28/24 0824  WBC 5.3 6.2 5.3  NEUTROABS 3,832 4,445 3,578  HGB 15.1 15.4 15.4  HCT 45.3 46.0 45.8  MCV 92.6 92.6 92.9  PLT 271 267 253   Lipid Panel: Recent Labs    08/23/23 0810 02/28/24 0824  CHOL 99 101  HDL 54 51  LDLCALC 32 36  TRIG 58 67  CHOLHDL 1.8 2.0   TSH: No results for input(s): TSH in the last 8760 hours. A1C: Lab Results  Component Value Date   HGBA1C 6.4 (H) 02/28/2024     Assessment/Plan  Need for influenza vaccination -     Flu vaccine HIGH DOSE PF(Fluzone Trivalent)   Assessment and Plan Assessment & Plan Essential hypertension Blood pressure slightly elevated. High sodium intake from foods like olives may contribute. - Reduce sodium intake, particularly from olives. - Monitor blood pressure at home and send readings via MyChart after 2-3 weeks. - Consider medication adjustment if blood pressure remains elevated.  Type 2 diabetes mellitus without complications A1c increased slightly but within prediabetic range. Weight gain noted. Reduced physical activity may have contributed. Continue current medications.  - Increase physical activity to help manage blood sugar levels and weight.  Allergic rhinitis with chronic sinus symptoms Chronic sinus congestion managed with guaifenesin and Xyzal. - Continue current regimen of guaifenesin and Xyzal.  Hyperlipidemia  Continues on lipitor with dietary modifications   General Health Maintenance Discussed need for routine vaccinations and screenings. COVID booster and RSV vaccine recommended, especially with expected newborn in family. Up to date on Tdap. - Send  prescription for COVID booster to pharmacy. - Recommend RSV vaccine, especially due to upcoming newborn in the family. - Schedule colonoscopy with [place] [facility] at [facility]    Return in about 6 months (around 08/30/2024) for routine follow up, labs prior to visit.:  [name] K. Caro BODILY [facility] [phone]

## 2024-03-03 ENCOUNTER — Other Ambulatory Visit: Payer: Self-pay | Admitting: Nurse Practitioner

## 2024-03-03 DIAGNOSIS — I10 Essential (primary) hypertension: Secondary | ICD-10-CM

## 2024-03-16 NOTE — Progress Notes (Addendum)
 [name]                                          MRN: [id number]   03/16/2024   The [facility] Quality Team Specialist reviewed this patient medical record for the purposes of chart review for care gap closure. The following were reviewed: chart review for care gap closure-controlling blood pressure and kidney health evaluation for diabetes:eGFR  and uACR.    [facility]BCI Quality Team

## 2024-03-26 ENCOUNTER — Encounter: Payer: Self-pay | Admitting: Nurse Practitioner

## 2024-04-28 ENCOUNTER — Other Ambulatory Visit: Payer: Self-pay | Admitting: Nurse Practitioner

## 2024-04-28 DIAGNOSIS — E119 Type 2 diabetes mellitus without complications: Secondary | ICD-10-CM

## 2024-04-29 ENCOUNTER — Other Ambulatory Visit: Payer: Self-pay | Admitting: Nurse Practitioner

## 2024-04-29 DIAGNOSIS — E1169 Type 2 diabetes mellitus with other specified complication: Secondary | ICD-10-CM

## 2024-05-01 ENCOUNTER — Other Ambulatory Visit: Payer: Self-pay | Admitting: Nurse Practitioner

## 2024-05-01 DIAGNOSIS — E1169 Type 2 diabetes mellitus with other specified complication: Secondary | ICD-10-CM

## 2024-05-06 ENCOUNTER — Encounter: Payer: Self-pay | Admitting: Nurse Practitioner

## 2024-05-06 DIAGNOSIS — I10 Essential (primary) hypertension: Secondary | ICD-10-CM

## 2024-05-07 ENCOUNTER — Ambulatory Visit

## 2024-05-07 VITALS — Ht 68.0 in | Wt 203.8 lb

## 2024-05-07 DIAGNOSIS — Z860100 Personal history of colon polyps, unspecified: Secondary | ICD-10-CM

## 2024-05-07 MED ORDER — NA SULFATE-K SULFATE-MG SULF 17.5-3.13-1.6 GM/177ML PO SOLN: 1.0000 | Freq: Once | ORAL | 0 refills | Status: AC

## 2024-05-07 NOTE — Progress Notes (Signed)
 No egg or soy allergy known to patient  No issues known to pt with past sedation with any surgeries or procedures Patient denies ever being told they had issues or difficulty with intubation  No FH of Malignant Hyperthermia Pt is not on diet pills Pt is not on  home 02  Pt is not on blood thinners  Pt denies issues with constipation  No A fib or A flutter Have any cardiac testing pending--no Pt can ambulate independendently Pt denies use of chewing tobacco Discussed diabetic I weight loss medication holds Discussed NSAID holds Checked BMI Pt instructed to use Singlecare.com or GoodRx for a price reduction on prep  Patient's chart reviewed by Norleen Schillings CNRA prior to previsit and patient appropriate for the [facility].  Pre visit completed and red dot placed by patient's name on their procedure day (on provider's schedule).

## 2024-05-10 ENCOUNTER — Telehealth: Payer: Self-pay

## 2024-05-10 MED ORDER — AMLODIPINE BESYLATE 5 MG PO TABS: 5.0000 mg | ORAL_TABLET | Freq: Every day | ORAL | 1 refills | Status: DC

## 2024-05-10 NOTE — Telephone Encounter (Signed)
 Answered via my chart

## 2024-05-10 NOTE — Telephone Encounter (Signed)
 Copied from CRM #[id number]. Topic: Clinical - Medical Advice >> May 10, 2024  9:45 AM Susanna ORN wrote: Reason for CRM: Patient called in stating that he sent a message to Harlene An via MyChart about a second set of blood pressure readings that he took. He would like for her to give him a call back. CB #: [phone].

## 2024-05-14 ENCOUNTER — Encounter: Payer: Self-pay | Admitting: Internal Medicine

## 2024-05-16 ENCOUNTER — Encounter: Payer: Self-pay | Admitting: Internal Medicine

## 2024-05-25 ENCOUNTER — Ambulatory Visit: Admitting: Internal Medicine

## 2024-05-25 ENCOUNTER — Encounter: Payer: Self-pay | Admitting: Internal Medicine

## 2024-05-25 VITALS — BP 116/66 | HR 87 | Temp 97.4°F | Resp 20 | Ht 68.0 in | Wt 203.8 lb

## 2024-05-25 DIAGNOSIS — Z860100 Personal history of colon polyps, unspecified: Secondary | ICD-10-CM

## 2024-05-25 DIAGNOSIS — Z860101 Personal history of adenomatous and serrated colon polyps: Secondary | ICD-10-CM | POA: Diagnosis not present

## 2024-05-25 DIAGNOSIS — K621 Rectal polyp: Secondary | ICD-10-CM

## 2024-05-25 DIAGNOSIS — Z1211 Encounter for screening for malignant neoplasm of colon: Secondary | ICD-10-CM | POA: Diagnosis not present

## 2024-05-25 DIAGNOSIS — K635 Polyp of colon: Secondary | ICD-10-CM

## 2024-05-25 DIAGNOSIS — D125 Benign neoplasm of sigmoid colon: Secondary | ICD-10-CM

## 2024-05-25 DIAGNOSIS — K648 Other hemorrhoids: Secondary | ICD-10-CM | POA: Diagnosis not present

## 2024-05-25 DIAGNOSIS — K5730 Diverticulosis of large intestine without perforation or abscess without bleeding: Secondary | ICD-10-CM

## 2024-05-25 DIAGNOSIS — D128 Benign neoplasm of rectum: Secondary | ICD-10-CM

## 2024-05-25 DIAGNOSIS — D124 Benign neoplasm of descending colon: Secondary | ICD-10-CM

## 2024-05-25 MED ORDER — SODIUM CHLORIDE 0.9 % IV SOLN: 500.0000 mL | Freq: Once | INTRAVENOUS | Status: DC

## 2024-05-25 NOTE — Progress Notes (Signed)
Pt. states no medical or surgical changes since previsit or office visit. 

## 2024-05-25 NOTE — Progress Notes (Signed)
 GASTROENTEROLOGY PROCEDURE H&P NOTE   Primary Care Physician: Caro Harlene POUR, NP    Reason for Procedure:  History of prior adenomatous polyps  Plan:    Surveillance colonoscopy  Patient is appropriate for endoscopic procedure(s) in the ambulatory ([facility]) setting.  The nature of the procedure, as well as the risks, benefits, and alternatives were carefully and thoroughly reviewed with the patient. Ample time for discussion and questions allowed.  All questions were answered. The patient understood, was satisfied, and agreed with the plan to proceed.    HPI: [name] is a 68 y.o. male who presents for colonoscopy.  Medical history as below.  Tolerated the prep.  No recent chest pain or shortness of breath.  No abdominal pain today.  Past Medical History:  Diagnosis Date   Allergy    Cancer (HCC)    skin cancer- squamous cell    Coronary artery calcification seen on CT scan 08/01/2023   CAC Score 1/5//23:  131 (60th percentile)    Diabetes mellitus without complication (HCC)    Type 2   H/O lymph node excision 1998   [place] , [place]   Hypertension    Sleep apnea    wears cpap    Squamous cell carcinoma of skin 12/31/2014   RIGHT FOREARM TX = CX3 5FU   Viral pneumonia 1965    Past Surgical History:  Procedure Laterality Date   COLONOSCOPY     LYMPH NODE DISSECTION  1998   MOLE REMOVAL  07/08/2021   Basal cell carcinoma- [facility]   perirectal abcess  1989   POLYPECTOMY     SKIN BIOPSY Right 05/21/2015   forearm Andrez Castle PA   WISDOM TOOTH EXTRACTION      Prior to Admission medications   Medication Sig Start Date End Date Taking? Authorizing Provider  amLODipine  (NORVASC ) 5 MG tablet Take 1 tablet (5 mg total) by mouth daily. 05/10/24  Yes Eubanks, Jessica K, NP  aspirin 81 MG tablet Take 81 mg by mouth daily.   Yes [provider]  atorvastatin  (LIPITOR) 40 MG tablet Take 1 tablet by mouth once daily 09/12/23  Yes Jeffrie Oneil BROCKS, MD   benazepril  (LOTENSIN ) 40 MG tablet Take 1 tablet by mouth once daily 03/05/24  Yes Eubanks, Jessica K, NP  fluticasone  (FLONASE ) 50 MCG/ACT nasal spray Use 2 spray(s) in each nostril once daily 05/02/23  Yes Eubanks, Jessica K, NP  guaiFENesin (MUCINEX) 600 MG 12 hr tablet Take 1,200 mg by mouth 2 (two) times daily.   Yes [provider]  hydrochlorothiazide  (MICROZIDE ) 12.5 MG capsule Take 1 capsule by mouth once daily 02/21/24  Yes Eubanks, Jessica K, NP  ibuprofen (ADVIL) 800 MG tablet Take 800 mg by mouth. 03/07/24  Yes [provider]  levocetirizine (XYZAL) 5 MG tablet Take 5 mg by mouth every evening.   Yes [provider]  metFORMIN  (GLUCOPHAGE ) 1000 MG tablet TAKE 1 TABLET BY MOUTH TWICE DAILY WITH A MEAL 04/30/24  Yes Caro Harlene POUR, NP  clobetasol  (TEMOVATE ) 0.05 % external solution Apply 1 application. topically 2 (two) times daily. 10/21/21   Sheffield, Kelli R, PA-C  doxycycline  (ADOXA) 100 MG tablet Take 100 mg by mouth as needed.    [provider]  ketoconazole  (NIZORAL ) 2 % shampoo Apply to scalp and let sit 3-5 minutes then rinse. 10/21/21   Sheffield, Kelli R, PA-C  MOUNJARO  5 MG/0.5ML Pen INJECT 1 PEN SUBCUTANEOUSLY ONCE A WEEK 04/30/24   Eubanks, Jessica K,  NP  psyllium (REGULOID) 0.52 G capsule Take 0.52 g by mouth daily.    [provider]    Current Outpatient Medications  Medication Sig Dispense Refill   amLODipine  (NORVASC ) 5 MG tablet Take 1 tablet (5 mg total) by mouth daily. 30 tablet 1   aspirin 81 MG tablet Take 81 mg by mouth daily.     atorvastatin  (LIPITOR) 40 MG tablet Take 1 tablet by mouth once daily 90 tablet 3   benazepril  (LOTENSIN ) 40 MG tablet Take 1 tablet by mouth once daily 90 tablet 0   fluticasone  (FLONASE ) 50 MCG/ACT nasal spray Use 2 spray(s) in each nostril once daily 48 g 3   guaiFENesin (MUCINEX) 600 MG 12 hr tablet Take 1,200 mg by mouth 2 (two) times daily.     hydrochlorothiazide  (MICROZIDE ) 12.5  MG capsule Take 1 capsule by mouth once daily 90 capsule 0   ibuprofen (ADVIL) 800 MG tablet Take 800 mg by mouth.     levocetirizine (XYZAL) 5 MG tablet Take 5 mg by mouth every evening.     metFORMIN  (GLUCOPHAGE ) 1000 MG tablet TAKE 1 TABLET BY MOUTH TWICE DAILY WITH A MEAL 180 tablet 1   clobetasol  (TEMOVATE ) 0.05 % external solution Apply 1 application. topically 2 (two) times daily. 50 mL 6   doxycycline  (ADOXA) 100 MG tablet Take 100 mg by mouth as needed.     ketoconazole  (NIZORAL ) 2 % shampoo Apply to scalp and let sit 3-5 minutes then rinse. 120 mL 5   MOUNJARO  5 MG/0.5ML Pen INJECT 1 PEN SUBCUTANEOUSLY ONCE A WEEK 4 mL 0   psyllium (REGULOID) 0.52 G capsule Take 0.52 g by mouth daily.     Current Facility-Administered Medications  Medication Dose Route Frequency Provider Last Rate Last Admin   0.9 %  sodium chloride  infusion  500 mL Intravenous Once Angelic Schnelle, Gordy HERO, MD        Allergies as of 05/25/2024 - Review Complete 05/25/2024  Allergen Reaction Noted   Fish allergy Swelling 04/27/2019    Family History  Problem Relation Age of Onset   Hypertension Mother    Diabetes Mother 2       Diabetes type 2   Heart disease Father 96       Heart Attack   Colon cancer Neg Hx    Colon polyps Neg Hx    Esophageal cancer Neg Hx    Rectal cancer Neg Hx    Stomach cancer Neg Hx     Social History   Socioeconomic History   Marital status: Married    Spouse name: Not on file   Number of children: Not on file   Years of education: Not on file   Highest education level: Master's degree (e.g., MA, MS, MEng, MEd, MSW, MBA)  Occupational History   Not on file  Tobacco Use   Smoking status: Never   Smokeless tobacco: Never  Vaping Use   Vaping status: Never Used  Substance and Sexual Activity   Alcohol use: Yes    Alcohol/week: 1.0 standard drink of alcohol    Types: 1 Standard drinks or equivalent per week    Comment: social   Drug use: No   Sexual activity: Not Currently   Other Topics Concern   Not on file  Social History Narrative   Diet: No soda, limited sweets such as candy and pastries.   Caffeine: Yes-Unsweetened ice tea   Marital status: Married  Year: 1983   Do you live in  a  House, how many stories: 2   How many persons live in your home: 2 and 1 temporarily   Do you have any pets in the home:Yes, Dog   Do you exercise: yes, walking, several times per week   Social Drivers of Health   Financial Resource Strain: Low Risk  (02/27/2024)   Overall Financial Resource Strain (CARDIA)    Difficulty of Paying Living Expenses: Not hard at all  Food Insecurity: No Food Insecurity (03/02/2024)   Hunger Vital Sign    Worried About Running Out of Food in the Last Year: Never true    Ran Out of Food in the Last Year: Never true  Transportation Needs: No Transportation Needs (03/02/2024)   PRAPARE - Administrator, Civil Service (Medical): No    Lack of Transportation (Non-Medical): No  Physical Activity: Sufficiently Active (02/27/2024)   Exercise Vital Sign    Days of Exercise per Week: 6 days    Minutes of Exercise per Session: 30 min  Stress: Stress Concern Present (02/27/2024)   [employer] of Occupational Health - Occupational Stress Questionnaire    Feeling of Stress: To some extent  Social Connections: Socially Integrated (03/02/2024)   Social Connection and Isolation Panel    Frequency of Communication with Friends and Family: Three times a week    Frequency of Social Gatherings with Friends and Family: Once a week    Attends Religious Services: More than 4 times per year    Active Member of [employer] or Organizations: Yes    Attends Banker Meetings: Never    Marital Status: Married  Catering Manager Violence: Not At Risk (03/02/2024)   Humiliation, Afraid, Rape, and Kick questionnaire    Fear of Current or Ex-Partner: No    Emotionally Abused: No    Physically Abused: No    Sexually Abused: No    Physical  Exam: Vital signs in last 24 hours: @BP  (!) 159/87   Pulse 96   Temp (!) 97.4 F (36.3 C) (Skin)   Ht 5' 8 (1.727 m)   Wt 203 lb 12.8 oz (92.4 kg)   SpO2 98%   BMI 30.99 kg/m  GEN: NAD EYE: Sclerae anicteric ENT: MMM CV: Non-tachycardic Pulm: CTA b/l GI: Soft, NT/ND NEURO:  Alert & Oriented x 3   Gordy Starch, MD [facility]  05/25/2024 8:24 AM

## 2024-05-25 NOTE — Progress Notes (Signed)
 Called to room to assist during endoscopic procedure.  Patient ID and intended procedure confirmed with present staff. Received instructions for my participation in the procedure from the performing physician.

## 2024-05-25 NOTE — Progress Notes (Signed)
 Transferred to PACU via stretcher.  Not responding to stimulation at this time.  VSS upon leaving procedure room.

## 2024-05-25 NOTE — Op Note (Signed)
 [facility] Patient Name: [name] Procedure Date: 05/25/2024 8:18 AM MRN: [id number] Endoscopist: Gordy CHRISTELLA Starch , MD, [id number] Age: 68 Referring MD:  Date of Birth: June 09, 1956 Gender: Male Account #: [id number] Procedure:                Colonoscopy Indications:              High risk colon cancer surveillance: Personal                            history of non-advanced adenoma, Last colonoscopy:                            November 2020 (inflammatory polyp x 1), Sept 2015                            (TA x 1) Medicines:                Monitored Anesthesia Care Procedure:                Pre-Anesthesia Assessment:                           - Prior to the procedure, a History and Physical                            was performed, and patient medications and                            allergies were reviewed. The patient's tolerance of                            previous anesthesia was also reviewed. The risks                            and benefits of the procedure and the sedation                            options and risks were discussed with the patient.                            All questions were answered, and informed consent                            was obtained. Prior Anticoagulants: The patient has                            taken no anticoagulant or antiplatelet agents. ASA                            Grade Assessment: III - A patient with severe                            systemic disease. After reviewing the risks and  benefits, the patient was deemed in satisfactory                            condition to undergo the procedure.                           After obtaining informed consent, the colonoscope                            was passed under direct vision. Throughout the                            procedure, the patient's blood pressure, pulse, and                            oxygen saturations were monitored continuously. The                             Colonoscope was introduced through the anus and                            advanced to the cecum, identified by appendiceal                            orifice and ileocecal valve. The colonoscopy was                            performed without difficulty. The patient tolerated                            the procedure well. The quality of the bowel                            preparation was good. The ileocecal valve,                            appendiceal orifice, and rectum were photographed. Scope In: 8:33:23 AM Scope Out: 8:52:26 AM Scope Withdrawal Time: 0 hours 17 minutes 8 seconds  Total Procedure Duration: 0 hours 19 minutes 3 seconds  Findings:                 The digital rectal exam was normal.                           A 4 mm polyp was found in the descending colon. The                            polyp was sessile. The polyp was removed with a                            cold snare. Resection and retrieval were complete.                           Two sessile polyps were found in the  sigmoid colon.                            The polyps were 4 to 5 mm in size. These polyps                            were removed with a cold snare. Resection and                            retrieval were complete.                           Two sessile polyps were found in the rectum. The                            polyps were 3 to 4 mm in size. These polyps were                            removed with a cold snare. Resection and retrieval                            were complete.                           Multiple medium-mouthed and small-mouthed                            diverticula were found in the sigmoid colon,                            descending colon and ascending colon.                           Internal hemorrhoids were found during                            retroflexion. The hemorrhoids were small. Complications:            No immediate complications. Estimated  Blood Loss:     Estimated blood loss was minimal. Impression:               - One 4 mm polyp in the descending colon, removed                            with a cold snare. Resected and retrieved.                           - Two 4 to 5 mm polyps in the sigmoid colon,                            removed with a cold snare. Resected and retrieved.                           - Two 3 to 4 mm polyps in the rectum, removed with  a cold snare. Resected and retrieved.                           - Moderate diverticulosis in the sigmoid colon, in                            the descending colon and in the ascending colon.                           - Small internal hemorrhoids. Recommendation:           - Patient has a contact number available for                            emergencies. The signs and symptoms of potential                            delayed complications were discussed with the                            patient. Return to normal activities tomorrow.                            Written discharge instructions were provided to the                            patient.                           - Resume previous diet.                           - Continue present medications.                           - Await pathology results.                           - Repeat colonoscopy is recommended for                            surveillance. The colonoscopy date will be                            determined after pathology results from today's                            exam become available for review. Gordy CHRISTELLA Starch, MD 05/25/2024 8:57:52 AM This report has been signed electronically.

## 2024-05-25 NOTE — Patient Instructions (Signed)

## 2024-05-26 ENCOUNTER — Other Ambulatory Visit: Payer: Self-pay | Admitting: Nurse Practitioner

## 2024-05-26 DIAGNOSIS — E1169 Type 2 diabetes mellitus with other specified complication: Secondary | ICD-10-CM

## 2024-05-26 DIAGNOSIS — I10 Essential (primary) hypertension: Secondary | ICD-10-CM

## 2024-05-28 ENCOUNTER — Telehealth: Payer: Self-pay

## 2024-05-28 NOTE — Telephone Encounter (Signed)
  Follow up Call-     05/25/2024    7:59 AM  Call back number  Post procedure Call Back phone  # [phone]  Permission to leave phone message Yes     Patient questions:  Do you have a fever, pain , or abdominal swelling? No. Pain Score  0 *  Have you tolerated food without any problems? Yes.    Have you been able to return to your normal activities? Yes.    Do you have any questions about your discharge instructions: Diet   No. Medications  No. Follow up visit  No.  Do you have questions or concerns about your Care? No.  Actions: * If pain score is 4 or above: No action needed, pain <4.

## 2024-05-29 ENCOUNTER — Ambulatory Visit: Payer: Self-pay | Admitting: Internal Medicine

## 2024-05-29 LAB — SURGICAL PATHOLOGY

## 2024-06-01 ENCOUNTER — Other Ambulatory Visit: Payer: Self-pay | Admitting: Nurse Practitioner

## 2024-06-01 DIAGNOSIS — I10 Essential (primary) hypertension: Secondary | ICD-10-CM

## 2024-06-20 ENCOUNTER — Other Ambulatory Visit: Payer: Self-pay | Admitting: Nurse Practitioner

## 2024-06-20 DIAGNOSIS — E1169 Type 2 diabetes mellitus with other specified complication: Secondary | ICD-10-CM

## 2024-07-02 ENCOUNTER — Encounter: Payer: Self-pay | Admitting: Nurse Practitioner

## 2024-07-02 ENCOUNTER — Ambulatory Visit: Admitting: Nurse Practitioner

## 2024-07-02 VITALS — BP 128/82 | HR 73 | Temp 97.1°F | Ht 68.0 in | Wt 203.0 lb

## 2024-07-02 DIAGNOSIS — E1169 Type 2 diabetes mellitus with other specified complication: Secondary | ICD-10-CM

## 2024-07-02 DIAGNOSIS — Z7984 Long term (current) use of oral hypoglycemic drugs: Secondary | ICD-10-CM | POA: Diagnosis not present

## 2024-07-02 DIAGNOSIS — I10 Essential (primary) hypertension: Secondary | ICD-10-CM

## 2024-07-02 MED ORDER — AMLODIPINE BESYLATE 5 MG PO TABS: 5.0000 mg | ORAL_TABLET | Freq: Every day | ORAL | 1 refills | Status: AC

## 2024-07-02 NOTE — Progress Notes (Signed)
 "   Careteam: Patient Care Team: Caro Melvin POUR, NP as PCP - General (Geriatric Medicine) Jeffrie Oneil BROCKS, MD as PCP - Cardiology (Cardiology) Livingston Rigg, MD as Consulting Physician (Dermatology) Jerri Kay HERO, MD as Attending Physician (Orthopedic Surgery) Wilma Ozell BRAVO, MD as Referring Physician (Ophthalmology) Neysa Reggy BIRCH, MD as Consulting Physician (Pulmonary Disease) Sheffield, Andrez SAUNDERS, PA-C (Inactive) as Physician Assistant (Dermatology)  PLACE OF SERVICE:  [facility]  Advanced Directive information    Allergies[1]  Chief Complaint  Patient presents with   Hypertension    Blood pressure follow-up. Patient has a pending eye exam for 07/11/24.     HPI:  Discussed the use of AI scribe software for clinical note transcription with the patient, who gave verbal consent to proceed.  History of Present Illness [name] is a 69 year old male with hypertension who presents for a blood pressure follow-up.  He has recently added amlodipine  5 mg daily to his regimn due to elevated bp, also taking benazepril  40 mg daily. Since the addition of amlodipine , his blood pressure readings have improved, consistently measuring in the low 120s over 70s, which he is pleased with.  He is careful with his salt intake, specifically mentioning that he has stopped eating olives, which he used to consume regularly. No swelling in his legs, and he has not experienced any other side effects from his medications.  He requests a refill for amlodipine .    Review of Systems:  Review of Systems  Constitutional:  Negative for chills, fever and weight loss.  HENT:  Negative for tinnitus.   Respiratory:  Negative for cough, sputum production and shortness of breath.   Cardiovascular:  Negative for chest pain, palpitations and leg swelling.  Gastrointestinal:  Negative for abdominal pain, constipation, diarrhea and heartburn.  Genitourinary:  Negative for dysuria, frequency and  urgency.  Musculoskeletal:  Negative for back pain, falls, joint pain and myalgias.  Skin: Negative.   Neurological:  Negative for dizziness and headaches.  Psychiatric/Behavioral:  Negative for depression and memory loss. The patient does not have insomnia.     Past Medical History:  Diagnosis Date   Allergy    Cancer (HCC)    skin cancer- squamous cell    Coronary artery calcification seen on CT scan 08/01/2023   CAC Score 1/5//23:  131 (60th percentile)    Diabetes mellitus without complication (HCC)    Type 2   H/O lymph node excision 1998   [place] , [place]   Hypertension    Sleep apnea    wears cpap    Squamous cell carcinoma of skin 12/31/2014   RIGHT FOREARM TX = CX3 5FU   Viral pneumonia 1965   Past Surgical History:  Procedure Laterality Date   COLONOSCOPY     LYMPH NODE DISSECTION  1998   MOLE REMOVAL  07/08/2021   Basal cell carcinoma- [facility]   perirectal abcess  1989   POLYPECTOMY     SKIN BIOPSY Right 05/21/2015   forearm Andrez Castle PA   WISDOM TOOTH EXTRACTION     Social History:   reports that he has never smoked. He has never used smokeless tobacco. He reports current alcohol use of about 1.0 standard drink of alcohol per week. He reports that he does not use drugs.  Family History  Problem Relation Age of Onset   Hypertension Mother    Diabetes Mother 22       Diabetes type 2   Heart disease Father  73       Heart Attack   Colon cancer Neg Hx    Colon polyps Neg Hx    Esophageal cancer Neg Hx    Rectal cancer Neg Hx    Stomach cancer Neg Hx     Medications: Patient's Medications  New Prescriptions   No medications on file  Previous Medications   AMLODIPINE  (NORVASC ) 5 MG TABLET    Take 1 tablet (5 mg total) by mouth daily.   ASPIRIN 81 MG TABLET    Take 81 mg by mouth daily.   ATORVASTATIN  (LIPITOR) 40 MG TABLET    Take 1 tablet by mouth once daily   BENAZEPRIL  (LOTENSIN ) 40 MG TABLET    Take 1 tablet by mouth once daily    CLOBETASOL  (TEMOVATE ) 0.05 % EXTERNAL SOLUTION    Apply 1 application. topically 2 (two) times daily.   DOXYCYCLINE  (ADOXA) 100 MG TABLET    Take 100 mg by mouth as needed.   FLUTICASONE  (FLONASE ) 50 MCG/ACT NASAL SPRAY    Use 2 spray(s) in each nostril once daily   GUAIFENESIN (MUCINEX) 600 MG 12 HR TABLET    Take 1,200 mg by mouth 2 (two) times daily.   HYDROCHLOROTHIAZIDE  (MICROZIDE ) 12.5 MG CAPSULE    Take 1 capsule by mouth once daily   IBUPROFEN (ADVIL) 800 MG TABLET    Take 800 mg by mouth.   KETOCONAZOLE  (NIZORAL ) 2 % SHAMPOO    Apply to scalp and let sit 3-5 minutes then rinse.   LEVOCETIRIZINE (XYZAL) 5 MG TABLET    Take 5 mg by mouth every evening.   METFORMIN  (GLUCOPHAGE ) 1000 MG TABLET    TAKE 1 TABLET BY MOUTH TWICE DAILY WITH A MEAL   MOUNJARO  5 MG/0.5ML PEN    INJECT 1 PEN SUBCUTANEOUSLY ONCE A WEEK   PSYLLIUM (REGULOID) 0.52 G CAPSULE    Take 0.52 g by mouth daily.  Modified Medications   No medications on file  Discontinued Medications   No medications on file    Physical Exam:  Vitals:   07/02/24 1320  BP: 128/82  Pulse: 73  Temp: (!) 97.1 F (36.2 C)  SpO2: 98%  Weight: 203 lb (92.1 kg)  Height: 5' 8 (1.727 m)   Body mass index is 30.87 kg/m. Wt Readings from Last 3 Encounters:  07/02/24 203 lb (92.1 kg)  05/25/24 203 lb 12.8 oz (92.4 kg)  05/07/24 203 lb 12.8 oz (92.4 kg)    Physical Exam Constitutional:      General: He is not in acute distress.    Appearance: He is well-developed. He is not diaphoretic.  HENT:     Head: Normocephalic and atraumatic.     Right Ear: External ear normal.     Left Ear: External ear normal.     Mouth/Throat:     Pharynx: No oropharyngeal exudate.  Eyes:     Conjunctiva/sclera: Conjunctivae normal.     Pupils: Pupils are equal, round, and reactive to light.  Cardiovascular:     Rate and Rhythm: Normal rate and regular rhythm.     Heart sounds: Normal heart sounds.  Pulmonary:     Effort: Pulmonary effort is  normal.     Breath sounds: Normal breath sounds.  Abdominal:     General: Bowel sounds are normal.     Palpations: Abdomen is soft.  Musculoskeletal:        General: No tenderness.     Cervical back: Normal range of motion and neck  supple.     Right lower leg: No edema.     Left lower leg: No edema.  Skin:    General: Skin is warm and dry.  Neurological:     Mental Status: He is alert and oriented to person, place, and time.     Labs reviewed: Basic Metabolic Panel: Recent Labs    08/23/23 0810 02/28/24 0824  NA 142 141  K 4.1 4.1  CL 105 103  CO2 26 30  GLUCOSE 110* 124*  BUN 11 11  CREATININE 0.72 0.73  CALCIUM  9.4 9.4   Liver Function Tests: Recent Labs    08/23/23 0810 02/28/24 0824  AST 17 16  ALT 18 19  BILITOT 1.5* 1.5*  PROT 6.5 6.2   No results for input(s): LIPASE, AMYLASE in the last 8760 hours. No results for input(s): AMMONIA in the last 8760 hours. CBC: Recent Labs    08/23/23 0810 02/28/24 0824  WBC 6.2 5.3  NEUTROABS 4,445 3,578  HGB 15.4 15.4  HCT 46.0 45.8  MCV 92.6 92.9  PLT 267 253   Lipid Panel: Recent Labs    08/23/23 0810 02/28/24 0824  CHOL 99 101  HDL 54 51  LDLCALC 32 36  TRIG 58 67  CHOLHDL 1.8 2.0   TSH: No results for input(s): TSH in the last 8760 hours. A1C: Lab Results  Component Value Date   HGBA1C 6.4 (H) 02/28/2024     Assessment/Plan   1. Essential hypertension Well-controlled with amlodipine  and benazepril . No side effects or peripheral edema. - Continue amlodipine  5 mg daily. - Continue benazepril  40 mg daily. - Refilled amlodipine  for 90 days. - Continue dietary salt reduction. - Follow-up in March for labs and re-evaluation. - amLODipine  (NORVASC ) 5 MG tablet; Take 1 tablet (5 mg total) by mouth daily.  Dispense: 90 tablet; Refill: 1  2. Type 2 diabetes mellitus with other specified complication, without long-term current use of insulin (HCC) (Primary) Encouraged dietary  compliance, routine foot care/monitoring and to keep up with diabetic eye exams through ophthalmology  - Urine Albumin/Creatinine with ratio (send out) [LAB689]; Future Continues med management    [name]. Caro BODILY [facility] [phone]     [1]  Allergies Allergen Reactions   Fish Allergy Swelling    Tuna fish--- Swelling of lips    "

## 2024-07-21 ENCOUNTER — Other Ambulatory Visit: Payer: Self-pay | Admitting: Nurse Practitioner

## 2024-07-21 DIAGNOSIS — E1169 Type 2 diabetes mellitus with other specified complication: Secondary | ICD-10-CM

## 2024-07-24 ENCOUNTER — Other Ambulatory Visit: Payer: Self-pay | Admitting: Nurse Practitioner

## 2024-07-24 DIAGNOSIS — E1169 Type 2 diabetes mellitus with other specified complication: Secondary | ICD-10-CM

## 2024-08-13 ENCOUNTER — Ambulatory Visit: Admitting: Emergency Medicine

## 2024-08-27 ENCOUNTER — Other Ambulatory Visit: Payer: Self-pay

## 2024-08-31 ENCOUNTER — Ambulatory Visit: Payer: Self-pay | Admitting: Nurse Practitioner
